# Patient Record
Sex: Male | Born: 1940
Health system: Southern US, Community
[De-identification: ages and names within clinical notes are randomized; demographics above are authoritative.]

## PROBLEM LIST (undated history)

## (undated) DIAGNOSIS — Z87442 Personal history of urinary calculi: Secondary | ICD-10-CM

## (undated) DIAGNOSIS — C801 Malignant (primary) neoplasm, unspecified: Secondary | ICD-10-CM

## (undated) DIAGNOSIS — C4491 Basal cell carcinoma of skin, unspecified: Secondary | ICD-10-CM

## (undated) DIAGNOSIS — M1990 Unspecified osteoarthritis, unspecified site: Secondary | ICD-10-CM

## (undated) DIAGNOSIS — N183 Chronic kidney disease, stage 3 unspecified: Secondary | ICD-10-CM

## (undated) DIAGNOSIS — A6920 Lyme disease, unspecified: Secondary | ICD-10-CM

## (undated) DIAGNOSIS — Z8619 Personal history of other infectious and parasitic diseases: Secondary | ICD-10-CM

## (undated) DIAGNOSIS — E785 Hyperlipidemia, unspecified: Secondary | ICD-10-CM

## (undated) DIAGNOSIS — C8330 Diffuse large B-cell lymphoma, unspecified site: Secondary | ICD-10-CM

## (undated) DIAGNOSIS — C189 Malignant neoplasm of colon, unspecified: Secondary | ICD-10-CM

## (undated) DIAGNOSIS — E119 Type 2 diabetes mellitus without complications: Secondary | ICD-10-CM

## (undated) DIAGNOSIS — I10 Essential (primary) hypertension: Secondary | ICD-10-CM

## (undated) DIAGNOSIS — C61 Malignant neoplasm of prostate: Secondary | ICD-10-CM

## (undated) HISTORY — PX: TONSILLECTOMY: SUR1361

## (undated) HISTORY — PX: COLONOSCOPY: SHX174

## (undated) HISTORY — PX: FRACTURE SURGERY: SHX138

## (undated) HISTORY — DX: Diffuse large B-cell lymphoma, unspecified site: C83.30

## (undated) HISTORY — PX: EYE SURGERY: SHX253

## (undated) HISTORY — PX: LITHOTRIPSY: SUR834

---

## 1996-02-22 DIAGNOSIS — C189 Malignant neoplasm of colon, unspecified: Secondary | ICD-10-CM

## 1996-02-22 HISTORY — PX: PROSTATECTOMY: SHX69

## 1996-02-22 HISTORY — PX: COLON SURGERY: SHX602

## 1996-02-22 HISTORY — DX: Malignant neoplasm of colon, unspecified: C18.9

## 1997-02-21 DIAGNOSIS — C61 Malignant neoplasm of prostate: Secondary | ICD-10-CM

## 1997-02-21 HISTORY — DX: Malignant neoplasm of prostate: C61

## 2006-02-21 DIAGNOSIS — C4491 Basal cell carcinoma of skin, unspecified: Secondary | ICD-10-CM

## 2006-02-21 HISTORY — DX: Basal cell carcinoma of skin, unspecified: C44.91

## 2014-09-04 DIAGNOSIS — I10 Essential (primary) hypertension: Secondary | ICD-10-CM | POA: Diagnosis not present

## 2014-09-04 DIAGNOSIS — C61 Malignant neoplasm of prostate: Secondary | ICD-10-CM | POA: Diagnosis not present

## 2014-09-04 DIAGNOSIS — N183 Chronic kidney disease, stage 3 unspecified: Secondary | ICD-10-CM | POA: Insufficient documentation

## 2014-09-04 DIAGNOSIS — E1129 Type 2 diabetes mellitus with other diabetic kidney complication: Secondary | ICD-10-CM | POA: Insufficient documentation

## 2014-09-04 DIAGNOSIS — E785 Hyperlipidemia, unspecified: Secondary | ICD-10-CM | POA: Insufficient documentation

## 2014-09-04 DIAGNOSIS — E782 Mixed hyperlipidemia: Secondary | ICD-10-CM | POA: Diagnosis not present

## 2014-09-04 DIAGNOSIS — Z85038 Personal history of other malignant neoplasm of large intestine: Secondary | ICD-10-CM | POA: Insufficient documentation

## 2014-09-04 DIAGNOSIS — E1122 Type 2 diabetes mellitus with diabetic chronic kidney disease: Secondary | ICD-10-CM | POA: Diagnosis not present

## 2014-09-16 DIAGNOSIS — Z79899 Other long term (current) drug therapy: Secondary | ICD-10-CM | POA: Diagnosis not present

## 2014-09-16 DIAGNOSIS — C61 Malignant neoplasm of prostate: Secondary | ICD-10-CM | POA: Diagnosis not present

## 2014-09-16 DIAGNOSIS — E782 Mixed hyperlipidemia: Secondary | ICD-10-CM | POA: Diagnosis not present

## 2014-09-16 DIAGNOSIS — E1122 Type 2 diabetes mellitus with diabetic chronic kidney disease: Secondary | ICD-10-CM | POA: Diagnosis not present

## 2014-09-16 DIAGNOSIS — N183 Chronic kidney disease, stage 3 (moderate): Secondary | ICD-10-CM | POA: Diagnosis not present

## 2014-11-03 DIAGNOSIS — Z85038 Personal history of other malignant neoplasm of large intestine: Secondary | ICD-10-CM | POA: Diagnosis not present

## 2014-12-08 DIAGNOSIS — L570 Actinic keratosis: Secondary | ICD-10-CM | POA: Diagnosis not present

## 2014-12-08 DIAGNOSIS — Z08 Encounter for follow-up examination after completed treatment for malignant neoplasm: Secondary | ICD-10-CM | POA: Diagnosis not present

## 2014-12-08 DIAGNOSIS — Z1283 Encounter for screening for malignant neoplasm of skin: Secondary | ICD-10-CM | POA: Diagnosis not present

## 2014-12-08 DIAGNOSIS — D485 Neoplasm of uncertain behavior of skin: Secondary | ICD-10-CM | POA: Diagnosis not present

## 2014-12-08 DIAGNOSIS — Z85828 Personal history of other malignant neoplasm of skin: Secondary | ICD-10-CM | POA: Diagnosis not present

## 2014-12-10 DIAGNOSIS — Z23 Encounter for immunization: Secondary | ICD-10-CM | POA: Diagnosis not present

## 2014-12-19 DIAGNOSIS — D225 Melanocytic nevi of trunk: Secondary | ICD-10-CM | POA: Diagnosis not present

## 2014-12-19 DIAGNOSIS — D485 Neoplasm of uncertain behavior of skin: Secondary | ICD-10-CM | POA: Diagnosis not present

## 2014-12-19 DIAGNOSIS — D2262 Melanocytic nevi of left upper limb, including shoulder: Secondary | ICD-10-CM | POA: Diagnosis not present

## 2014-12-29 ENCOUNTER — Ambulatory Visit
Admission: RE | Admit: 2014-12-29 | Discharge: 2014-12-29 | Disposition: A | Discharge status: Home / Self Care | Payer: Commercial Managed Care - HMO | Source: Ambulatory Visit | Attending: Gastroenterology | Admitting: Gastroenterology

## 2014-12-29 ENCOUNTER — Encounter
Admission: RE | Disposition: A | Discharge status: Home / Self Care | Payer: Self-pay | Source: Ambulatory Visit | Attending: Gastroenterology

## 2014-12-29 ENCOUNTER — Ambulatory Visit: Payer: Commercial Managed Care - HMO | Admitting: Certified Registered Nurse Anesthetist

## 2014-12-29 DIAGNOSIS — K5730 Diverticulosis of large intestine without perforation or abscess without bleeding: Secondary | ICD-10-CM | POA: Diagnosis not present

## 2014-12-29 DIAGNOSIS — K760 Fatty (change of) liver, not elsewhere classified: Secondary | ICD-10-CM | POA: Diagnosis not present

## 2014-12-29 DIAGNOSIS — Z980 Intestinal bypass and anastomosis status: Secondary | ICD-10-CM | POA: Diagnosis not present

## 2014-12-29 DIAGNOSIS — N183 Chronic kidney disease, stage 3 (moderate): Secondary | ICD-10-CM | POA: Diagnosis not present

## 2014-12-29 DIAGNOSIS — I129 Hypertensive chronic kidney disease with stage 1 through stage 4 chronic kidney disease, or unspecified chronic kidney disease: Secondary | ICD-10-CM | POA: Insufficient documentation

## 2014-12-29 DIAGNOSIS — Z9221 Personal history of antineoplastic chemotherapy: Secondary | ICD-10-CM | POA: Insufficient documentation

## 2014-12-29 DIAGNOSIS — K648 Other hemorrhoids: Secondary | ICD-10-CM | POA: Insufficient documentation

## 2014-12-29 DIAGNOSIS — Z8546 Personal history of malignant neoplasm of prostate: Secondary | ICD-10-CM | POA: Insufficient documentation

## 2014-12-29 DIAGNOSIS — Z86010 Personal history of colonic polyps: Secondary | ICD-10-CM | POA: Insufficient documentation

## 2014-12-29 DIAGNOSIS — Z85038 Personal history of other malignant neoplasm of large intestine: Secondary | ICD-10-CM | POA: Insufficient documentation

## 2014-12-29 DIAGNOSIS — D125 Benign neoplasm of sigmoid colon: Secondary | ICD-10-CM | POA: Diagnosis not present

## 2014-12-29 DIAGNOSIS — Z85828 Personal history of other malignant neoplasm of skin: Secondary | ICD-10-CM | POA: Insufficient documentation

## 2014-12-29 DIAGNOSIS — Z7982 Long term (current) use of aspirin: Secondary | ICD-10-CM | POA: Insufficient documentation

## 2014-12-29 DIAGNOSIS — E785 Hyperlipidemia, unspecified: Secondary | ICD-10-CM | POA: Diagnosis not present

## 2014-12-29 DIAGNOSIS — Z8669 Personal history of other diseases of the nervous system and sense organs: Secondary | ICD-10-CM | POA: Insufficient documentation

## 2014-12-29 DIAGNOSIS — K635 Polyp of colon: Secondary | ICD-10-CM | POA: Diagnosis not present

## 2014-12-29 DIAGNOSIS — Z79899 Other long term (current) drug therapy: Secondary | ICD-10-CM | POA: Insufficient documentation

## 2014-12-29 DIAGNOSIS — K640 First degree hemorrhoids: Secondary | ICD-10-CM | POA: Diagnosis not present

## 2014-12-29 DIAGNOSIS — D123 Benign neoplasm of transverse colon: Secondary | ICD-10-CM | POA: Diagnosis not present

## 2014-12-29 DIAGNOSIS — I10 Essential (primary) hypertension: Secondary | ICD-10-CM | POA: Diagnosis not present

## 2014-12-29 DIAGNOSIS — K5790 Diverticulosis of intestine, part unspecified, without perforation or abscess without bleeding: Secondary | ICD-10-CM | POA: Diagnosis not present

## 2014-12-29 HISTORY — DX: Chronic kidney disease, stage 3 unspecified: N18.30

## 2014-12-29 HISTORY — DX: Chronic kidney disease, stage 3 (moderate): N18.3

## 2014-12-29 HISTORY — DX: Essential (primary) hypertension: I10

## 2014-12-29 HISTORY — DX: Malignant neoplasm of colon, unspecified: C18.9

## 2014-12-29 HISTORY — DX: Basal cell carcinoma of skin, unspecified: C44.91

## 2014-12-29 HISTORY — DX: Malignant neoplasm of prostate: C61

## 2014-12-29 HISTORY — DX: Type 2 diabetes mellitus without complications: E11.9

## 2014-12-29 HISTORY — DX: Malignant (primary) neoplasm, unspecified: C80.1

## 2014-12-29 HISTORY — PX: COLONOSCOPY WITH PROPOFOL: SHX5780

## 2014-12-29 HISTORY — DX: Lyme disease, unspecified: A69.20

## 2014-12-29 HISTORY — DX: Hyperlipidemia, unspecified: E78.5

## 2014-12-29 SURGERY — COLONOSCOPY WITH PROPOFOL
Anesthesia: General

## 2014-12-29 MED ORDER — SODIUM CHLORIDE 0.9 % IV SOLN
INTRAVENOUS | Status: DC
  Administered 2014-12-29: 1000 mL via INTRAVENOUS

## 2014-12-29 MED ORDER — PROPOFOL 500 MG/50ML IV EMUL
INTRAVENOUS | Status: DC | PRN
  Administered 2014-12-29: 140 ug/kg/min via INTRAVENOUS

## 2014-12-29 MED ORDER — MIDAZOLAM HCL 2 MG/2ML IJ SOLN
INTRAMUSCULAR | Status: DC | PRN
  Administered 2014-12-29: 2 mg via INTRAVENOUS

## 2014-12-29 MED ORDER — PHENYLEPHRINE HCL 10 MG/ML IJ SOLN
INTRAMUSCULAR | Status: DC | PRN
  Administered 2014-12-29: 50 ug via INTRAVENOUS

## 2014-12-29 MED ORDER — FENTANYL CITRATE (PF) 100 MCG/2ML IJ SOLN
INTRAMUSCULAR | Status: DC | PRN
  Administered 2014-12-29: 50 ug via INTRAVENOUS
  Administered 2014-12-29 (×2): 25 ug via INTRAVENOUS

## 2014-12-29 MED ORDER — PROPOFOL 10 MG/ML IV BOLUS
INTRAVENOUS | Status: DC | PRN
  Administered 2014-12-29: 20 mg via INTRAVENOUS
  Administered 2014-12-29: 10 mg via INTRAVENOUS

## 2014-12-29 MED ORDER — SODIUM CHLORIDE 0.9 % IV SOLN: INTRAVENOUS | Status: DC

## 2014-12-29 NOTE — Anesthesia Procedure Notes (Signed)
Procedure Name: MAC Date/Time: 12/29/2014 8:11 AM Performed by: Johnna Acosta Pre-anesthesia Checklist: Patient identified, Emergency Drugs available, Suction available, Patient being monitored and Timeout performed Patient Re-evaluated:Patient Re-evaluated prior to inductionOxygen Delivery Method: Nasal cannula

## 2014-12-29 NOTE — H&P (Signed)
Outpatient short stay form Pre-procedure 12/29/2014 8:07 AM Christian Sails MD  Primary Physician: Dr. Genene Churn  Reason for visit:  Colonoscopy  History of present illness:  Asian is a 74 year old male presenting today for colon cancer surveillance procedure. Personal history of colon cancer with possible sigmoid colectomy in the late 1990s. Also had chemotherapy the last time in 1999. His had colonoscopies since then he has had some colon polyps however not on the last procedure. He has not taken any aspirin products for about a week. He takes no blood thinners.    Current facility-administered medications:  .  0.9 %  sodium chloride infusion, , Intravenous, Continuous, Christian Sails, MD, Last Rate: 20 mL/hr at 12/29/14 0751, 1,000 mL at 12/29/14 0751 .  0.9 %  sodium chloride infusion, , Intravenous, Continuous, Christian Sails, MD  Prescriptions prior to admission  Medication Sig Dispense Refill Last Dose  . aspirin EC 81 MG tablet Take 81 mg by mouth daily.     Marland Kitchen atorvastatin (LIPITOR) 40 MG tablet Take 40 mg by mouth daily.     . hydrochlorothiazide (HYDRODIURIL) 25 MG tablet Take 25 mg by mouth daily.     Marland Kitchen lisinopril (PRINIVIL,ZESTRIL) 10 MG tablet Take 10 mg by mouth daily.     . Multiple Vitamin (MULTIVITAMIN) capsule Take 1 capsule by mouth daily.     . Probiotic Product (PROBIOTIC PO) Take 1 capsule by mouth.        No Known Allergies   Past Medical History  Diagnosis Date  . Cancer ([place])   . Basal cell carcinoma   . Chronic renal insufficiency, stage III (moderate)   . Hypertension   . Colon cancer ([place])   . Lyme disease   . Hyperlipemia   . Prostate cancer ([place])     Review of systems:      Physical Exam    Heart and lungs: Regular rate and rhythm without rub or gallop, lungs are bilaterally clear    HEENT: Normocephalic atraumatic eyes are anicteric    Other:     Pertinant exam for procedure: Soft nontender nondistended bowel sounds positive  normoactive    Planned proceedures: Colonoscopy and indicated procedures I have discussed the risks benefits and complications of procedures to include not limited to bleeding, infection, perforation and the risk of sedation and the patient wishes to proceed.    Christian Sails, MD Gastroenterology 12/29/2014  8:07 AM

## 2014-12-29 NOTE — Op Note (Signed)
[facility] Gastroenterology Patient Name: Christian Kelly Procedure Date: 12/29/2014 8:11 AM MRN: [id number] Account #: [id number] Date of Birth: Jul 05, 1940 Admit Type: Outpatient Age: 74 Room: [facility] ENDO ROOM 3 Gender: Male Note Status: Finalized Procedure:         Colonoscopy Indications:       Personal history of malignant neoplasm of the colon,                     Personal history of colonic polyps Providers:         Lollie Sails, MD Referring MD:      Christena Flake. Raechel Ache, MD (Referring MD) Medicines:         Monitored Anesthesia Care Complications:     No immediate complications. Procedure:         Pre-Anesthesia Assessment:                    - ASA Grade Assessment: III - A patient with severe                     systemic disease.                    After obtaining informed consent, the colonoscope was                     passed under direct vision. Throughout the procedure, the                     patient's blood pressure, pulse, and oxygen saturations                     were monitored continuously. The Colonoscope was                     introduced through the anus and advanced to the the cecum,                     identified by appendiceal orifice and ileocecal valve. The                     colonoscopy was performed without difficulty. The patient                     tolerated the procedure well. The quality of the bowel                     preparation was good. Findings:      Two sessile polyps were found in the distal sigmoid colon. The polyps       were 2 to 3 mm in size. These polyps were removed with a cold biopsy       forceps. Resection and retrieval were complete.      A 4 mm polyp was found in the distal sigmoid colon. The polyp was       pedunculated. The polyp was removed with a cold snare. Resection and       retrieval were complete.      Multiple small-mouthed diverticula were found in the proximal sigmoid       colon and in the descending  colon.      There was evidence of a prior end-to-end colo-colonic anastomosis at 17       cm proximal to the anus. This was patent. This was characterized by  healthy appearing mucosa.      Three sessile polyps were found in the transverse colon. The polyps were       1 to 2 mm in size. These polyps were removed with a cold biopsy forceps.       Resection and retrieval were complete.      Non-bleeding internal hemorrhoids were found during anoscopy. The       hemorrhoids were small.      No additional abnormalities were found on retroflexion.      The digital rectal exam was normal. Impression:        - Two 2 to 3 mm polyps in the distal sigmoid colon.                     Resected and retrieved.                    - One 4 mm polyp in the distal sigmoid colon. Resected and                     retrieved.                    - Diverticulosis in the proximal sigmoid colon and in the                     descending colon.                    - Patent end-to-end colo-colonic anastomosis,                     characterized by healthy appearing mucosa.                    - Three 1 to 2 mm polyps in the transverse colon. Resected                     and retrieved.                    - Non-bleeding internal hemorrhoids. Recommendation:    - Await pathology results.                    - Telephone GI clinic for pathology results in 1 week. Procedure Code(s): --- Professional ---                    [id number], Colonoscopy, flexible; with removal of tumor(s),                     polyp(s), or other lesion(s) by snare technique                    45380, 59, Colonoscopy, flexible; with biopsy, single or                     multiple Diagnosis Code(s): --- Professional ---                    211.3, Benign neoplasm of colon                    455.0, Internal hemorrhoids without mention of complication                    V10.05, Personal history of malignant neoplasm of large  intestine                     V12.72, Personal history of colonic polyps                    562.10, Diverticulosis of colon (without mention of                     hemorrhage)                    V45.3, Intestinal bypass or anastomosis status CPT copyright 2014 American Medical Association. All rights reserved. The codes documented in this report are preliminary and upon coder review may  be revised to meet current compliance requirements. Lollie Sails, MD 12/29/2014 8:47:26 AM This report has been signed electronically. Number of Addenda: 0 Note Initiated On: 12/29/2014 8:11 AM Scope Withdrawal Time: 0 hours 13 minutes 10 seconds  Total Procedure Duration: 0 hours 24 minutes 4 seconds       [facility]

## 2014-12-29 NOTE — Transfer of Care (Signed)
Immediate Anesthesia Transfer of Care Note  Patient: Christian Kelly  Procedure(s) Performed: Procedure(s): COLONOSCOPY WITH PROPOFOL (N/A)  Patient Location: PACU  Anesthesia Type:General  Level of Consciousness: sedated  Airway & Oxygen Therapy: Patient Spontanous Breathing and Patient connected to nasal cannula oxygen  Post-op Assessment: Report given to RN and Post -op Vital signs reviewed and stable  Post vital signs: Reviewed and stable  Last Vitals:  Filed Vitals:   12/29/14 0736  BP: 168/79  Pulse: 56  Temp: 36.3 C  Resp: 19    Complications: No apparent anesthesia complications

## 2014-12-29 NOTE — Anesthesia Preprocedure Evaluation (Signed)
Anesthesia Evaluation  Patient identified by MRN, date of birth, ID band Patient awake    Reviewed: Allergy & Precautions, H&P , NPO status , Patient's Chart, lab work & pertinent test results, reviewed documented beta blocker date and time   History of Anesthesia Complications Negative for: history of anesthetic complications  Airway Mallampati: II  TM Distance: >3 FB Neck ROM: full    Dental  (+) Caps, Teeth Intact   Pulmonary neg shortness of breath, neg sleep apnea, neg COPD, neg recent URI, former smoker,    Pulmonary exam normal breath sounds clear to auscultation       Cardiovascular Exercise Tolerance: Good hypertension, On Medications (-) angina(-) CAD, (-) Past MI, (-) Cardiac Stents and (-) CABG Normal cardiovascular exam(-) dysrhythmias (-) Valvular Problems/Murmurs Rhythm:regular Rate:Normal     Neuro/Psych negative neurological ROS  negative psych ROS   GI/Hepatic negative GI ROS, Fatty liver disease   Endo/Other  diabetes (borderline)  Renal/GU CRFRenal disease  negative genitourinary   Musculoskeletal   Abdominal   Peds  Hematology negative hematology ROS (+)   Anesthesia Other Findings Past Medical History:   Cancer ([place])                                                 Basal cell carcinoma                                         Chronic renal insufficiency, stage III (modera*              Hypertension                                                 Colon cancer ([facility])                                           Lyme disease                                                 Hyperlipemia                                                 Prostate cancer (HCC)                                        Reproductive/Obstetrics negative OB ROS                             Anesthesia Physical Anesthesia Plan  ASA: III  Anesthesia Plan: General   Post-op Pain Management:     Induction:   Airway Management Planned:  Additional Equipment:   Intra-op Plan:   Post-operative Plan:   Informed Consent: I have reviewed the patients History and Physical, chart, labs and discussed the procedure including the risks, benefits and alternatives for the proposed anesthesia with the patient or authorized representative who has indicated his/her understanding and acceptance.   Dental Advisory Given  Plan Discussed with: Anesthesiologist, CRNA and Surgeon  Anesthesia Plan Comments:         Anesthesia Quick Evaluation

## 2014-12-29 NOTE — Anesthesia Postprocedure Evaluation (Signed)
  Anesthesia Post-op Note  Patient: Christian Kelly  Procedure(s) Performed: Procedure(s): COLONOSCOPY WITH PROPOFOL (N/A)  Anesthesia type:General  Patient location: PACU  Post pain: Pain level controlled  Post assessment: Post-op Vital signs reviewed, Patient's Cardiovascular Status Stable, Respiratory Function Stable, Patent Airway and No signs of Nausea or vomiting  Post vital signs: Reviewed and stable  Last Vitals:  Filed Vitals:   12/29/14 0922  BP: 114/72  Pulse: 52  Temp:   Resp: 13    Level of consciousness: awake, alert  and patient cooperative  Complications: No apparent anesthesia complications

## 2014-12-30 ENCOUNTER — Encounter: Payer: Self-pay | Admitting: Gastroenterology

## 2014-12-30 LAB — SURGICAL PATHOLOGY

## 2015-03-04 DIAGNOSIS — H35342 Macular cyst, hole, or pseudohole, left eye: Secondary | ICD-10-CM | POA: Diagnosis not present

## 2015-04-07 DIAGNOSIS — E782 Mixed hyperlipidemia: Secondary | ICD-10-CM | POA: Diagnosis not present

## 2015-04-07 DIAGNOSIS — Z79899 Other long term (current) drug therapy: Secondary | ICD-10-CM | POA: Insufficient documentation

## 2015-04-07 DIAGNOSIS — D126 Benign neoplasm of colon, unspecified: Secondary | ICD-10-CM | POA: Insufficient documentation

## 2015-04-07 DIAGNOSIS — E1122 Type 2 diabetes mellitus with diabetic chronic kidney disease: Secondary | ICD-10-CM | POA: Diagnosis not present

## 2015-04-07 DIAGNOSIS — C61 Malignant neoplasm of prostate: Secondary | ICD-10-CM | POA: Diagnosis not present

## 2015-04-07 DIAGNOSIS — N183 Chronic kidney disease, stage 3 (moderate): Secondary | ICD-10-CM | POA: Diagnosis not present

## 2015-04-07 DIAGNOSIS — Z86010 Personal history of colonic polyps: Secondary | ICD-10-CM | POA: Insufficient documentation

## 2015-07-28 DIAGNOSIS — W57XXXA Bitten or stung by nonvenomous insect and other nonvenomous arthropods, initial encounter: Secondary | ICD-10-CM | POA: Diagnosis not present

## 2015-07-28 DIAGNOSIS — G44229 Chronic tension-type headache, not intractable: Secondary | ICD-10-CM | POA: Diagnosis not present

## 2015-12-03 DIAGNOSIS — Z79899 Other long term (current) drug therapy: Secondary | ICD-10-CM | POA: Diagnosis not present

## 2015-12-03 DIAGNOSIS — I10 Essential (primary) hypertension: Secondary | ICD-10-CM | POA: Diagnosis not present

## 2015-12-03 DIAGNOSIS — E782 Mixed hyperlipidemia: Secondary | ICD-10-CM | POA: Diagnosis not present

## 2015-12-03 DIAGNOSIS — Z23 Encounter for immunization: Secondary | ICD-10-CM | POA: Diagnosis not present

## 2015-12-03 DIAGNOSIS — N182 Chronic kidney disease, stage 2 (mild): Secondary | ICD-10-CM | POA: Diagnosis not present

## 2015-12-03 DIAGNOSIS — E1122 Type 2 diabetes mellitus with diabetic chronic kidney disease: Secondary | ICD-10-CM | POA: Diagnosis not present

## 2015-12-03 DIAGNOSIS — C61 Malignant neoplasm of prostate: Secondary | ICD-10-CM | POA: Diagnosis not present

## 2015-12-30 DIAGNOSIS — Z1283 Encounter for screening for malignant neoplasm of skin: Secondary | ICD-10-CM | POA: Diagnosis not present

## 2015-12-30 DIAGNOSIS — L821 Other seborrheic keratosis: Secondary | ICD-10-CM | POA: Diagnosis not present

## 2015-12-30 DIAGNOSIS — L570 Actinic keratosis: Secondary | ICD-10-CM | POA: Diagnosis not present

## 2015-12-30 DIAGNOSIS — Z872 Personal history of diseases of the skin and subcutaneous tissue: Secondary | ICD-10-CM | POA: Diagnosis not present

## 2015-12-30 DIAGNOSIS — Z09 Encounter for follow-up examination after completed treatment for conditions other than malignant neoplasm: Secondary | ICD-10-CM | POA: Diagnosis not present

## 2015-12-30 DIAGNOSIS — B078 Other viral warts: Secondary | ICD-10-CM | POA: Diagnosis not present

## 2016-04-06 DIAGNOSIS — H35342 Macular cyst, hole, or pseudohole, left eye: Secondary | ICD-10-CM | POA: Diagnosis not present

## 2016-04-06 DIAGNOSIS — H401130 Primary open-angle glaucoma, bilateral, stage unspecified: Secondary | ICD-10-CM | POA: Diagnosis not present

## 2016-06-02 DIAGNOSIS — B351 Tinea unguium: Secondary | ICD-10-CM | POA: Insufficient documentation

## 2016-06-02 DIAGNOSIS — E782 Mixed hyperlipidemia: Secondary | ICD-10-CM | POA: Diagnosis not present

## 2016-06-02 DIAGNOSIS — N182 Chronic kidney disease, stage 2 (mild): Secondary | ICD-10-CM | POA: Diagnosis not present

## 2016-06-02 DIAGNOSIS — Z79899 Other long term (current) drug therapy: Secondary | ICD-10-CM | POA: Diagnosis not present

## 2016-06-02 DIAGNOSIS — C61 Malignant neoplasm of prostate: Secondary | ICD-10-CM | POA: Diagnosis not present

## 2016-06-02 DIAGNOSIS — E041 Nontoxic single thyroid nodule: Secondary | ICD-10-CM | POA: Diagnosis not present

## 2016-06-02 DIAGNOSIS — E1122 Type 2 diabetes mellitus with diabetic chronic kidney disease: Secondary | ICD-10-CM | POA: Diagnosis not present

## 2016-06-02 DIAGNOSIS — I10 Essential (primary) hypertension: Secondary | ICD-10-CM | POA: Diagnosis not present

## 2016-06-02 DIAGNOSIS — N183 Chronic kidney disease, stage 3 (moderate): Secondary | ICD-10-CM | POA: Diagnosis not present

## 2016-06-08 ENCOUNTER — Other Ambulatory Visit: Payer: Self-pay | Admitting: Internal Medicine

## 2016-06-08 DIAGNOSIS — E041 Nontoxic single thyroid nodule: Secondary | ICD-10-CM

## 2016-06-14 ENCOUNTER — Ambulatory Visit
Admission: RE | Admit: 2016-06-14 | Discharge: 2016-06-14 | Disposition: A | Discharge status: Home / Self Care | Payer: Medicare HMO | Source: Ambulatory Visit | Attending: Internal Medicine | Admitting: Internal Medicine

## 2016-06-14 DIAGNOSIS — Z8639 Personal history of other endocrine, nutritional and metabolic disease: Secondary | ICD-10-CM | POA: Diagnosis not present

## 2016-06-14 DIAGNOSIS — E041 Nontoxic single thyroid nodule: Secondary | ICD-10-CM | POA: Insufficient documentation

## 2016-06-20 ENCOUNTER — Other Ambulatory Visit: Payer: Self-pay | Admitting: Internal Medicine

## 2016-06-20 ENCOUNTER — Ambulatory Visit: Payer: Medicare HMO | Admitting: Oncology

## 2016-07-11 ENCOUNTER — Inpatient Hospital Stay: Payer: Medicare HMO | Admitting: Oncology

## 2016-07-11 ENCOUNTER — Inpatient Hospital Stay: Payer: Medicare HMO | Attending: Oncology | Admitting: Oncology

## 2016-07-11 ENCOUNTER — Encounter: Payer: Self-pay | Admitting: Oncology

## 2016-07-11 VITALS — BP 132/79 | HR 83 | Temp 96.3°F | Resp 18 | Ht 72.44 in | Wt 196.4 lb

## 2016-07-11 DIAGNOSIS — R9721 Rising PSA following treatment for malignant neoplasm of prostate: Secondary | ICD-10-CM

## 2016-07-11 DIAGNOSIS — Z79899 Other long term (current) drug therapy: Secondary | ICD-10-CM | POA: Diagnosis not present

## 2016-07-11 DIAGNOSIS — C61 Malignant neoplasm of prostate: Secondary | ICD-10-CM | POA: Diagnosis not present

## 2016-07-11 DIAGNOSIS — Z87891 Personal history of nicotine dependence: Secondary | ICD-10-CM | POA: Diagnosis not present

## 2016-07-11 DIAGNOSIS — E1121 Type 2 diabetes mellitus with diabetic nephropathy: Secondary | ICD-10-CM | POA: Insufficient documentation

## 2016-07-11 DIAGNOSIS — Z7982 Long term (current) use of aspirin: Secondary | ICD-10-CM | POA: Insufficient documentation

## 2016-07-11 DIAGNOSIS — Z85038 Personal history of other malignant neoplasm of large intestine: Secondary | ICD-10-CM | POA: Insufficient documentation

## 2016-07-11 DIAGNOSIS — N529 Male erectile dysfunction, unspecified: Secondary | ICD-10-CM | POA: Diagnosis not present

## 2016-07-11 DIAGNOSIS — Z9079 Acquired absence of other genital organ(s): Secondary | ICD-10-CM | POA: Diagnosis not present

## 2016-07-11 DIAGNOSIS — Z85828 Personal history of other malignant neoplasm of skin: Secondary | ICD-10-CM | POA: Diagnosis not present

## 2016-07-11 DIAGNOSIS — M549 Dorsalgia, unspecified: Secondary | ICD-10-CM | POA: Diagnosis not present

## 2016-07-11 DIAGNOSIS — Z8052 Family history of malignant neoplasm of bladder: Secondary | ICD-10-CM

## 2016-07-11 DIAGNOSIS — I129 Hypertensive chronic kidney disease with stage 1 through stage 4 chronic kidney disease, or unspecified chronic kidney disease: Secondary | ICD-10-CM | POA: Insufficient documentation

## 2016-07-11 DIAGNOSIS — N183 Chronic kidney disease, stage 3 (moderate): Secondary | ICD-10-CM | POA: Diagnosis not present

## 2016-07-11 DIAGNOSIS — E785 Hyperlipidemia, unspecified: Secondary | ICD-10-CM | POA: Insufficient documentation

## 2016-07-11 DIAGNOSIS — Z8042 Family history of malignant neoplasm of prostate: Secondary | ICD-10-CM

## 2016-07-11 DIAGNOSIS — I10 Essential (primary) hypertension: Secondary | ICD-10-CM | POA: Insufficient documentation

## 2016-07-11 DIAGNOSIS — Z9221 Personal history of antineoplastic chemotherapy: Secondary | ICD-10-CM | POA: Insufficient documentation

## 2016-07-11 DIAGNOSIS — E119 Type 2 diabetes mellitus without complications: Secondary | ICD-10-CM

## 2016-07-11 LAB — CBC WITH DIFFERENTIAL/PLATELET
Basophils Absolute: 0.1 10*3/uL (ref 0–0.1)
Basophils Relative: 1 %
Eosinophils Absolute: 0.2 10*3/uL (ref 0–0.7)
Eosinophils Relative: 2 %
HEMATOCRIT: 46.4 % (ref 40.0–52.0)
HEMOGLOBIN: 15.7 g/dL (ref 13.0–18.0)
LYMPHS ABS: 1.2 10*3/uL (ref 1.0–3.6)
LYMPHS PCT: 13 %
MCH: 30.8 pg (ref 26.0–34.0)
MCHC: 33.8 g/dL (ref 32.0–36.0)
MCV: 91.2 fL (ref 80.0–100.0)
Monocytes Absolute: 0.7 10*3/uL (ref 0.2–1.0)
Monocytes Relative: 8 %
NEUTROS ABS: 6.9 10*3/uL — AB (ref 1.4–6.5)
Neutrophils Relative %: 76 %
Platelets: 232 10*3/uL (ref 150–440)
RBC: 5.09 MIL/uL (ref 4.40–5.90)
RDW: 12.7 % (ref 11.5–14.5)
WBC: 9.2 10*3/uL (ref 3.8–10.6)

## 2016-07-11 LAB — PSA: PSA: 0.61 ng/mL (ref 0.00–4.00)

## 2016-07-11 LAB — COMPREHENSIVE METABOLIC PANEL
ALBUMIN: 4 g/dL (ref 3.5–5.0)
ALK PHOS: 86 U/L (ref 38–126)
ALT: 27 U/L (ref 17–63)
AST: 25 U/L (ref 15–41)
Anion gap: 9 (ref 5–15)
BILIRUBIN TOTAL: 0.7 mg/dL (ref 0.3–1.2)
BUN: 29 mg/dL — ABNORMAL HIGH (ref 6–20)
CALCIUM: 9 mg/dL (ref 8.9–10.3)
CO2: 26 mmol/L (ref 22–32)
Chloride: 101 mmol/L (ref 101–111)
Creatinine, Ser: 1.1 mg/dL (ref 0.61–1.24)
GFR calc non Af Amer: >60 mL/min (ref >60)
GLUCOSE: 163 mg/dL — AB (ref 65–99)
Potassium: 3.7 mmol/L (ref 3.5–5.1)
Sodium: 136 mmol/L (ref 135–145)
TOTAL PROTEIN: 7.5 g/dL (ref 6.5–8.1)

## 2016-07-11 NOTE — Progress Notes (Signed)
Here for new pt evaluation.  

## 2016-07-11 NOTE — Progress Notes (Signed)
Please notify pcp and patient of his blood sugars

## 2016-07-11 NOTE — Progress Notes (Signed)
Hematology/Oncology Consult note [facility] [phone] [phone]  Patient Care Team: Ezequiel Kayser, MD as PCP - General (Internal Medicine)   Name of the patient: Christian Kelly  [id number]  [birth year]    Reason for referral- h/o prostate cancer s/p prostactectomy. Now with biochemical recurrence   Referring physician- Dr. Ezequiel Kayser  Date of visit: 07/11/16   History of presenting illness- Patient is a 76 yr old male with a h/o prostate cancer diagnosed in 1999 s/p radical prostatectomy. Prior to that he had colon cancer in 1998 s/o surgery and adjuvant chemotherapy in 1998. With regards to prostate cancer- surgery was done at [facility] in [place] and he was followed at [place] subsequently. Patient think his Gleasons score was 7 at diagnosis. He has not required any radiation therapy or ADT so far. Patient still spends 4-5 months at [place].   Dr. Raechel Ache has been monitoring his PSArecently  and his recent trend of PSA has been as follows: psa was 0.33 in feb 2017 and 0.63 in April 2018. We have 1 psa from 2015 when it was 0.3  He is otheriwse doing well. Reports mild back pain when he bends backwards. Denies other complaints  ECOG PS- 0  Pain scale- 0   Review of systems- Review of Systems  Constitutional: Negative for chills, fever, malaise/fatigue and weight loss.  HENT: Negative for congestion, ear discharge and nosebleeds.   Eyes: Negative for blurred vision.  Respiratory: Negative for cough, hemoptysis, sputum production, shortness of breath and wheezing.   Cardiovascular: Negative for chest pain, palpitations, orthopnea and claudication.  Gastrointestinal: Negative for abdominal pain, blood in stool, constipation, diarrhea, heartburn, melena, nausea and vomiting.  Genitourinary: Negative for dysuria, flank pain, frequency, hematuria and urgency.  Musculoskeletal: Negative for back pain, joint pain and myalgias.    Skin: Negative for rash.  Neurological: Negative for dizziness, tingling, focal weakness, seizures, weakness and headaches.  Endo/Heme/Allergies: Does not bruise/bleed easily.  Psychiatric/Behavioral: Negative for depression and suicidal ideas. The patient does not have insomnia.     No Known Allergies  Patient Active Problem List   Diagnosis Date Noted  . Essential hypertension 09/04/2014  . History of colon cancer 09/04/2014  . Hyperlipidemia, unspecified 09/04/2014  . Prostate cancer ([place]) 09/04/2014  . Type 2 diabetes mellitus with renal manifestations ([place]) 09/04/2014     Past Medical History:  Diagnosis Date  . Basal cell carcinoma 2008   forehead  . Cancer ([place])   . Chronic renal insufficiency, stage III (moderate)   . Colon cancer ([place]) 1998  . Hyperlipemia   . Hypertension   . Lyme disease   . Prostate cancer ([place]) 1999     Past Surgical History:  Procedure Laterality Date  . COLON SURGERY    . COLONOSCOPY    . COLONOSCOPY WITH PROPOFOL N/A 12/29/2014   Procedure: COLONOSCOPY WITH PROPOFOL;  Surgeon: Lollie Sails, MD;  Location: [facility] ENDOSCOPY;  Service: Endoscopy;  Laterality: N/A;  . EYE SURGERY    . FRACTURE SURGERY    . LITHOTRIPSY    . PROSTATECTOMY    . TONSILLECTOMY      Social History   Social History  . Marital status: Married    Spouse name: N/A  . Number of children: N/A  . Years of education: N/A   Occupational History  . Not on file.   Social History Main Topics  . Smoking status: Former Smoker    Years: 8.00  . Smokeless tobacco:  Former Systems developer    Types: Snuff  . Alcohol use Yes     Comment: rarely-one or two beer  . Drug use: No  . Sexual activity: Not Currently   Other Topics Concern  . Not on file   Social History Narrative  . No narrative on file     Family History  Problem Relation Age of Onset  . Coronary artery disease Mother   . Diabetes Mother   . Prostate cancer Father   . Pancreatitis Father   .  Bladder Cancer Sister   . Diabetes Brother   . Diabetes Brother   . Diabetes Brother      Current Outpatient Prescriptions:  .  aspirin EC 81 MG tablet, Take 81 mg by mouth daily., Disp: , Rfl:  .  atorvastatin (LIPITOR) 40 MG tablet, Take 40 mg by mouth daily., Disp: , Rfl:  .  hydrochlorothiazide (HYDRODIURIL) 25 MG tablet, Take 25 mg by mouth daily., Disp: , Rfl:  .  lisinopril (PRINIVIL,ZESTRIL) 10 MG tablet, Take 10 mg by mouth daily., Disp: , Rfl:  .  Multiple Vitamin (MULTIVITAMIN) capsule, Take 1 capsule by mouth daily., Disp: , Rfl:  .  Probiotic Product (PROBIOTIC PO), Take 1 capsule by mouth., Disp: , Rfl:    Physical exam:  Vitals:   07/11/16 1002  BP: 132/79  Pulse: 83  Resp: 18  Temp: (!) 96.3 F (35.7 C)  TempSrc: Tympanic  Weight: 196 lb 6.9 oz (89.1 kg)  Height: 6' 0.44" (1.84 m)   Physical Exam  Constitutional: He is oriented to person, place, and time and well-developed, well-nourished, and in no distress.  HENT:  Head: Normocephalic and atraumatic.  Eyes: EOM are normal. Pupils are equal, round, and reactive to light.  Neck: Normal range of motion.  Cardiovascular: Normal rate, regular rhythm and normal heart sounds.   Pulmonary/Chest: Effort normal and breath sounds normal.  Abdominal: Soft. Bowel sounds are normal.  Neurological: He is alert and oriented to person, place, and time.  Skin: Skin is warm and dry.         US Soft Tissue Head And Neck  Result Date: 06/14/2016 CLINICAL DATA:  76 year old male with a history of thyroid nodule EXAM: THYROID ULTRASOUND TECHNIQUE: Ultrasound examination of the thyroid gland and adjacent soft tissues was performed. COMPARISON:  None. FINDINGS: Parenchymal Echotexture: Normal Isthmus: 0.3 cm Right lobe: 6.0 cm x 2.0 cm x 1.3 cm Left lobe: 4.6 cm x 1.5 cm x 2.2 cm _________________________________________________________ Estimated total number of nodules >/= 1 cm: 0 Number of spongiform nodules >/=  2 cm not  described below (TR1): 0 Number of mixed cystic and solid nodules >/= 1.5 cm not described below (TR2): 0 _________________________________________________________ Nodule # 1: Location: Right; Mid Maximum size: 0.9 cm; Other 2 dimensions: 0.6 cm x 0.7 cm Composition: solid/almost completely solid (2) Echogenicity: hypoechoic (2) Shape: not taller-than-wide (0) Margins: ill-defined (0) Echogenic foci: macrocalcifications (1) ACR TI-RADS total points: 5. ACR TI-RADS risk category: TR4 (4-6 points). ACR TI-RADS recommendations: Nodule does not meet criteria for surveillance or biopsy _________________________________________________________ IMPRESSION: No thyroid nodule meets criteria for biopsy or surveillance, as designated by the newly established ACR TI-RADS criteria. Recommendations follow those established by the new ACR TI-RADS criteria (J Am Coll Radiol 0315;94:585-929). Electronically Signed   By: Corrie Mckusick D.O.   On: 06/14/2016 09:58    Assessment and plan- Patient is a 76 y.o. male with h/o prostate cancer s/p radical prostatectomy in 1999 now with  biochemical recurrence  Discussed natural history of prostate cancer and management options for biochemical recurrence. Given that his PSA is >0.2 post prostatectmy, he does meet criteria for biochemical recurrence. His PSA doubling time is > 3 months and it has been 20 yrs since surgery indicating that his tempo of progression is slow. At this time, I will obtain bone scan to r/o metastatic prostate cancer. Will hold off on CT thorax and abdomen give that PSA <10. I will refer him Dr. Baruch Gouty for consideration of salvage RT to prostatic bed. We will try to obtain prior operative records of his prostate cancer and he will likely be a candidate for ADT in conjunction to RT at this time. I will refer him to Urology for the same to manage his ADT. I will see him back in 1 months time to discuss bone scan results. I will touch base with urology if they would  like to follow him at this time when he is still castrate sensitive and have him see [place] when he becomes castrate resistant. Check cbc, cmp, psa and serum testosterone today  Thank you for this kind referral and the opportunity to participate in the care of this patient   Visit Diagnosis 1. Increasing PSA level after treatment for prostate cancer     Dr. Randa Evens, MD, MPH [facility] Pager- [phone] 07/11/2016  10:56 AM

## 2016-07-12 LAB — TESTOSTERONE: Testosterone: 454 ng/dL (ref 264–916)

## 2016-07-13 ENCOUNTER — Encounter: Payer: Self-pay | Admitting: Urology

## 2016-07-13 ENCOUNTER — Encounter: Payer: Medicare HMO | Attending: Internal Medicine | Admitting: *Deleted

## 2016-07-13 ENCOUNTER — Ambulatory Visit (INDEPENDENT_AMBULATORY_CARE_PROVIDER_SITE_OTHER): Payer: Medicare HMO | Admitting: Urology

## 2016-07-13 ENCOUNTER — Encounter: Payer: Self-pay | Admitting: *Deleted

## 2016-07-13 VITALS — BP 124/78 | HR 70 | Ht 72.0 in | Wt 195.0 lb

## 2016-07-13 VITALS — BP 110/74 | Ht 72.0 in | Wt 194.7 lb

## 2016-07-13 DIAGNOSIS — C61 Malignant neoplasm of prostate: Secondary | ICD-10-CM

## 2016-07-13 DIAGNOSIS — Z6826 Body mass index (BMI) 26.0-26.9, adult: Secondary | ICD-10-CM | POA: Diagnosis not present

## 2016-07-13 DIAGNOSIS — Z713 Dietary counseling and surveillance: Secondary | ICD-10-CM | POA: Insufficient documentation

## 2016-07-13 DIAGNOSIS — N529 Male erectile dysfunction, unspecified: Secondary | ICD-10-CM

## 2016-07-13 DIAGNOSIS — E119 Type 2 diabetes mellitus without complications: Secondary | ICD-10-CM | POA: Diagnosis not present

## 2016-07-13 DIAGNOSIS — R3915 Urgency of urination: Secondary | ICD-10-CM

## 2016-07-13 NOTE — Progress Notes (Signed)
07/13/2016 3:58 PM   Christian Kelly [birth year] [id number]  Referring provider: Ezequiel Kayser, MD [facility] [place], [place] [address]  Chief Complaint  Patient presents with  . Elevated PSA    New Patient    HPI: 76 year old male who presents today for further evaluation of biochemical recurrence of prostate cancer. He is status post open radical proctectomy in 1999 performed at [facility], [place]. His urologist was Dr. Reece Levy. Unfortunately, he says not have any surgical pathology available today for review.  Records have been requested by Dr. Janese Banks and are pending.  He notes that immediately after his prostatectomy, his PSA was detectable summary and the 0.05 range. He remained low and stable for many years.  More recently, he has been followed by his PCPs following his PSA. In 2015, his PSA was 0.3. This was stable last rate 0.33. This year, his PSA has risen to 0.61 on 07/11/16 (repeat confirmatory value).    Left arm injury in the past/ fractured bone.    He denies any weight loss or bone pain. He denies any significant urinary symptoms including urinary frequency, incomplete bladder emptying, dysuria, or gross hematuria. He has very little stress urinary incontinence and wears no pads or diapers. He does report that when his bladder is full, he has fairly significant urgency. He waits generally no longer than 2 hours to avoid to avoid this. No episodes of urge incontinence.  He does have severe baseline erectile dysfunction. He is tried PDE 5 inhibitors without success as well as vacuum erection device and briefly tried injections. He has wife are currently not sexually active but may be interested in pursuing intervention in the future.   PMH: Past Medical History:  Diagnosis Date  . Basal cell carcinoma 2008   forehead  . Cancer ([place])   . Chronic renal insufficiency, stage III (moderate)   . Colon cancer ([place]) 1998  . Diabetes  mellitus without complication ([place])   . Hyperlipemia   . Hypertension   . Lyme disease   . Prostate cancer [facility]) 1999    Surgical History: Past Surgical History:  Procedure Laterality Date  . COLON SURGERY  1998  . COLONOSCOPY    . COLONOSCOPY WITH PROPOFOL N/A 12/29/2014   Procedure: COLONOSCOPY WITH PROPOFOL;  Surgeon: Lollie Sails, MD;  Location: [facility] ENDOSCOPY;  Service: Endoscopy;  Laterality: N/A;  . EYE SURGERY    . FRACTURE SURGERY    . LITHOTRIPSY    . PROSTATECTOMY  1998  . TONSILLECTOMY      Home Medications:  Allergies as of 07/13/2016   No Known Allergies     Medication List       Accurate as of 07/13/16  3:58 PM. Always use your most recent med list.          aspirin EC 81 MG tablet Take 81 mg by mouth every other day.   atorvastatin 40 MG tablet Commonly known as:  LIPITOR Take 40 mg by mouth daily.   hydrochlorothiazide 25 MG tablet Commonly known as:  HYDRODIURIL Take 25 mg by mouth daily.   lisinopril 10 MG tablet Commonly known as:  PRINIVIL,ZESTRIL Take 10 mg by mouth daily.   multivitamin capsule Take 1 capsule by mouth daily.   PROBIOTIC PO Take 1 capsule by mouth.       Allergies: No Known Allergies  Family History: Family History  Problem Relation Age of Onset  . Coronary artery disease Mother   .  Diabetes Mother   . Prostate cancer Father   . Pancreatitis Father   . Bladder Cancer Sister   . Diabetes Brother   . Diabetes Brother   . Prostate cancer Brother   . Diabetes Brother   . Diabetes Maternal Grandmother     Social History:  reports that he has quit smoking. His smoking use included Cigarettes. He has a 8.00 pack-year smoking history. He has quit using smokeless tobacco. His smokeless tobacco use included Snuff. He reports that he drinks alcohol. He reports that he does not use drugs.  ROS: UROLOGY Frequent Urination?: No Hard to postpone urination?: No Burning/pain with urination?: No Get up at night  to urinate?: No Leakage of urine?: No Urine stream starts and stops?: No Trouble starting stream?: No Do you have to strain to urinate?: No Blood in urine?: No Urinary tract infection?: No Sexually transmitted disease?: No Injury to kidneys or bladder?: No Painful intercourse?: No Weak stream?: No Erection problems?: Yes Penile pain?: No  Gastrointestinal Nausea?: No Vomiting?: No Indigestion/heartburn?: No Diarrhea?: No Constipation?: No  Constitutional Fever: No Night sweats?: No Weight loss?: No Fatigue?: No  Skin Skin rash/lesions?: No Itching?: No  Eyes Blurred vision?: No Double vision?: No  Ears/Nose/Throat Sore throat?: No Sinus problems?: No  Hematologic/Lymphatic Swollen glands?: No Easy bruising?: No  Cardiovascular Leg swelling?: No Chest pain?: No  Respiratory Cough?: No Shortness of breath?: No  Endocrine Excessive thirst?: No  Musculoskeletal Back pain?: No Joint pain?: Yes  Neurological Headaches?: No Dizziness?: Yes  Psychologic Depression?: No Anxiety?: No  Physical Exam: BP 124/78   Pulse 70   Ht 6' (1.829 m)   Wt 195 lb (88.5 kg)   BMI 26.45 kg/m   Constitutional:  Alert and oriented, No acute distress.  Accompanied by wife today. HEENT: [place] AT, moist mucus membranes.  Trachea midline, no masses. Cardiovascular: No clubbing, cyanosis, or edema. Respiratory: Normal respiratory effort, no increased work of breathing. GI: Abdomen is soft, nontender, nondistended, no abdominal masses GU: No CVA tenderness.  Skin: No rashes, bruises or suspicious lesions. Neurologic: Grossly intact, no focal deficits, moving all 4 extremities. Psychiatric: Normal mood and affect.  Laboratory Data: Lab Results  Component Value Date   WBC 9.2 07/11/2016   HGB 15.7 07/11/2016   HCT 46.4 07/11/2016   MCV 91.2 07/11/2016   PLT 232 07/11/2016    Lab Results  Component Value Date   CREATININE 1.10 07/11/2016    Lab Results    Component Value Date   PSA 0.61 07/11/2016    Lab Results  Component Value Date   TESTOSTERONE 454 07/11/2016    Urinalysis n/a  Pertinent Imaging: n/a  Assessment & Plan:  Remote history of prostate cancer in 1999) was radical prostatectomy with persistently low PSAs which have been slowly rising with more recent acceleration, PSA doubling time of approximately one year. PSA currently 0.6.  1. Prostate cancer [facility]) Currently meets criteria for biochemical recurrence with PSA doubling time of approximately one year Agree with Dr. Janese Banks, recommend staging with bone scan and would also recommend CT abdomen and pelvis to rule out metastatic disease If above staging is negative which I anticipate will be, I recommended further evaluation with Dr. Baruch Gouty for salvage radiation.  Specifically, we discussed that if there is residual prostate cancer in the bed of the prostate, this can be treated effectively with external beam radiation with chance for a cure. Given his age, the goal would be to avoid morbidity from  metastatic disease in the future. We really couldn't discussion about risk and benefits of salvage radiation which will be further discussed by the radiation oncologist.  Alternatives including continued watchful waiting were also discussed. Role of incision deprivation therapy at this time is questionable. We discussed the risk of ADT and the patient would prefer to avoid this if possible. As such, we'll proceed with radiation and follow his PSA with consideration of ADT in the future as needed.  Casas personally discussed both with Dr. Janese Banks and Dr. Noreene Filbert. Urology will continue to follow this patient rather than medical oncology until he progresses to metastatic cashier resistant prostate cancer.  - CT Abdomen Pelvis W Contrast; Future  2. Urinary urgency Baseline urinary frequency, discussed that if the patient elects to proceed with salvage radiation, his frequency may  worsen. Patient will let us know if his urinary symptoms become worse and would like to try an anticholinergic medication versus beta 3 agonists which could be sent to his pharmacy.  3. Erectile dysfunction, unspecified erectile dysfunction type Discussion today regarding treatment options for refractory erectile dysfunction. Patient may be excision pursuing this in the future.   Return in about 3 months (around 10/13/2016) for recheck prostate cancer.  Hollice Espy, MD  [facility] [address] [place], [place] [address] [phone]  I spent 45 min with this patient of which greater than 50% was spent in counseling and coordination of care with the patient.

## 2016-07-13 NOTE — Progress Notes (Signed)
Diabetes Self-Management Education  Visit Type: First/Initial  Appt. Start Time: 0820 Appt. End Time: 0920  07/13/2016  [name], identified by name and date of birth, is a 76 y.o. male with a diagnosis of Diabetes: Type 2.   ASSESSMENT  Blood pressure 110/74, height 6' (1.829 m), weight 194 lb 11.2 oz (88.3 kg). Body mass index is 26.41 kg/m.      Diabetes Self-Management Education - 07/13/16 0946      Visit Information   Visit Type First/Initial     Initial Visit   Diabetes Type Type 2   Are you currently following a meal plan? Yes   What type of meal plan do you follow? low sugar and low salt   Are you taking your medications as prescribed? Yes   Date Diagnosed 2 years ago     Health Coping   How would you rate your overall health? Fair     Psychosocial Assessment   Patient Belief/Attitude about Diabetes Motivated to manage diabetes   Self-care barriers None   Self-management support Doctor's office;Family   Patient Concerns Nutrition/Meal planning;Medication;Glycemic Control;Monitoring;Healthy Lifestyle   Special Needs None   Preferred Learning Style Auditory   Learning Readiness Change in progress   How often do you need to have someone help you when you read instructions, pamphlets, or other written materials from your doctor or pharmacy? 1 - Never     Pre-Education Assessment   Patient understands the diabetes disease and treatment process. Needs Instruction   Patient understands incorporating nutritional management into lifestyle. Needs Instruction   Patient undertands incorporating physical activity into lifestyle. Needs Instruction   Patient understands using medications safely. Needs Instruction   Patient understands monitoring blood glucose, interpreting and using results Needs Instruction   Patient understands prevention, detection, and treatment of acute complications. Needs Instruction   Patient understands prevention, detection, and treatment  of chronic complications. Needs Instruction   Patient understands how to develop strategies to address psychosocial issues. Needs Instruction   Patient understands how to develop strategies to promote health/change behavior. Needs Instruction     Complications   Last HgB A1C per patient/outside source 6.9 %  06/02/16   How often do you check your blood sugar? 0 times/day (not testing)  Pt declined glucometer today.    Have you had a dilated eye exam in the past 12 months? Yes   Have you had a dental exam in the past 12 months? Yes   Are you checking your feet? Yes   How many days per week are you checking your feet? 6     Dietary Intake   Breakfast eggs, cereal and fruit, waffle, pancake   Lunch sometimes skips - peanut butter and jelly sandwich, fruit, granola bar   Dinner pork chop, spaghetti, chicken, stir fry, cabbage, onions, broccoli   Snack (evening) tortilla chips   Beverage(s) water, coffee, unsweetened tea with stevia, diet soda, fruit juices     Exercise   Exercise Type ADL's     Patient Education   Previous Diabetes Education No   Disease state  Definition of diabetes, type 1 and 2, and the diagnosis of diabetes;Factors that contribute to the development of diabetes   Nutrition management  Role of diet in the treatment of diabetes and the relationship between the three main macronutrients and blood glucose level;Food label reading, portion sizes and measuring food.   Physical activity and exercise  Role of exercise on diabetes management, blood pressure control and cardiac  health.   Monitoring Purpose and frequency of SMBG.;Identified appropriate SMBG and/or A1C goals.   Chronic complications Relationship between chronic complications and blood glucose control   Psychosocial adjustment Identified and addressed patients feelings and concerns about diabetes     Individualized Goals (developed by patient)   Reducing Risk Improve blood sugars Decrease medications Prevent  diabetes complications Lead a healthier lifestyle     Outcomes   Expected Outcomes Demonstrated interest in learning. Expect positive outcomes   Future DMSE Other (comment)  Pt to check his calendar and call back to schedule classes.       Individualized Plan for Diabetes Self-Management Training:   Learning Objective:  Patient will have a greater understanding of diabetes self-management. Patient education plan is to attend individual and/or group sessions per assessed needs and concerns.   Plan:   Patient Instructions  Exercise: Begin walking   for  15 minutes 3 days a week and gradually increase to 30 minutes 5 x week Eat 3 meals day,  1-2 snacks a day Space meals 4-6 hours apart Don't skip meals Avoid sugar sweetened drinks (juices)   Expected Outcomes:  Demonstrated interest in learning. Expect positive outcomes  Education material provided:  General Meal Planning Guidelines Simple Meal Plan  If problems or questions, patient to contact team via:  Johny Drilling, RN, CCM, CDE [phone]  Future DSME appointment: Pt to check his calendar and call back to schedule classes.

## 2016-07-13 NOTE — Patient Instructions (Signed)
Exercise: Begin walking   for  15 minutes 3 days a week and gradually increase to 30 minutes 5 x week  Eat 3 meals day,  1-2 snacks a day Space meals 4-6 hours apart Don't skip meals Avoid sugar sweetened drinks (juices)  Return for classes on:

## 2016-07-14 ENCOUNTER — Other Ambulatory Visit: Payer: Self-pay

## 2016-07-14 ENCOUNTER — Encounter: Payer: Self-pay | Admitting: Radiation Oncology

## 2016-07-14 ENCOUNTER — Ambulatory Visit
Admission: RE | Admit: 2016-07-14 | Discharge: 2016-07-14 | Disposition: A | Discharge status: Home / Self Care | Payer: Medicare HMO | Source: Ambulatory Visit | Attending: Radiation Oncology | Admitting: Radiation Oncology

## 2016-07-14 VITALS — BP 120/73 | HR 65 | Temp 96.7°F | Resp 18 | Ht 72.0 in | Wt 194.8 lb

## 2016-07-14 DIAGNOSIS — I129 Hypertensive chronic kidney disease with stage 1 through stage 4 chronic kidney disease, or unspecified chronic kidney disease: Secondary | ICD-10-CM | POA: Diagnosis not present

## 2016-07-14 DIAGNOSIS — Z8052 Family history of malignant neoplasm of bladder: Secondary | ICD-10-CM | POA: Insufficient documentation

## 2016-07-14 DIAGNOSIS — Z79899 Other long term (current) drug therapy: Secondary | ICD-10-CM | POA: Insufficient documentation

## 2016-07-14 DIAGNOSIS — C61 Malignant neoplasm of prostate: Secondary | ICD-10-CM | POA: Insufficient documentation

## 2016-07-14 DIAGNOSIS — Z7982 Long term (current) use of aspirin: Secondary | ICD-10-CM | POA: Diagnosis not present

## 2016-07-14 DIAGNOSIS — Z85828 Personal history of other malignant neoplasm of skin: Secondary | ICD-10-CM | POA: Diagnosis not present

## 2016-07-14 DIAGNOSIS — N183 Chronic kidney disease, stage 3 (moderate): Secondary | ICD-10-CM | POA: Insufficient documentation

## 2016-07-14 DIAGNOSIS — E785 Hyperlipidemia, unspecified: Secondary | ICD-10-CM | POA: Diagnosis not present

## 2016-07-14 DIAGNOSIS — Z87891 Personal history of nicotine dependence: Secondary | ICD-10-CM | POA: Insufficient documentation

## 2016-07-14 DIAGNOSIS — Z8042 Family history of malignant neoplasm of prostate: Secondary | ICD-10-CM | POA: Diagnosis not present

## 2016-07-14 DIAGNOSIS — N529 Male erectile dysfunction, unspecified: Secondary | ICD-10-CM | POA: Diagnosis not present

## 2016-07-14 DIAGNOSIS — Z85038 Personal history of other malignant neoplasm of large intestine: Secondary | ICD-10-CM | POA: Diagnosis not present

## 2016-07-14 DIAGNOSIS — E119 Type 2 diabetes mellitus without complications: Secondary | ICD-10-CM | POA: Insufficient documentation

## 2016-07-14 DIAGNOSIS — Z9079 Acquired absence of other genital organ(s): Secondary | ICD-10-CM | POA: Diagnosis not present

## 2016-07-14 DIAGNOSIS — R9721 Rising PSA following treatment for malignant neoplasm of prostate: Secondary | ICD-10-CM | POA: Diagnosis not present

## 2016-07-14 NOTE — Consult Note (Signed)
NEW PATIENT EVALUATION  Name: Christian Kelly  MRN: [id number]  Date:   07/14/2016     DOB: [birth year]   This 76 y.o. male patient presents to the clinic for initial evaluation of prostate cancer in patient with slowly rising PSA status post open radical prostatectomy 1999.  REFERRING PHYSICIAN: Ezequiel Kayser, MD  CHIEF COMPLAINT:  Chief Complaint  Patient presents with  . Prostate Cancer    Pt is here for initial consultation of prostate cancer.      DIAGNOSIS: The encounter diagnosis was Malignant neoplasm of prostate ([place]).   PREVIOUS INVESTIGATIONS:  Clinical notes reviewed Bone scan and CT scans abdomen and pelvis ordered Pathology reports requested  HPI: Patient is a 76 year old male who underwent open radical prostatectomy back in 1999 at the [facility]. We do not have pathology reports available although patient describes that his PSA after prostatectomy was in the 0.05 range. It was stable for many years although started rising 2015 and has doubled over the past year from 0.33 2.61. He specifically denies any bone pain. He does not have dysuria urgency or frequency or incontinence. He does have baseline erectile dysfunction. Bone scan and CT scan of abdomen and pelvis have been ordered. He is referred to radiation oncology for consideration of salvage radiation therapy.  PLANNED TREATMENT REGIMEN: Possible salvage radiation therapy to prostatic fossa  PAST MEDICAL HISTORY:  has a past medical history of Basal cell carcinoma (2008); Cancer ([place]); Chronic renal insufficiency, stage III (moderate); Colon cancer ([place]) (1998); Diabetes mellitus without complication ([place]); Hyperlipemia; Hypertension; Lyme disease; and Prostate cancer ([place]) (1999).    PAST SURGICAL HISTORY:  Past Surgical History:  Procedure Laterality Date  . COLON SURGERY  1998  . COLONOSCOPY    . COLONOSCOPY WITH PROPOFOL N/A 12/29/2014   Procedure: COLONOSCOPY WITH PROPOFOL;  Surgeon: Lollie Sails, MD;  Location: [facility] ENDOSCOPY;  Service: Endoscopy;  Laterality: N/A;  . EYE SURGERY    . FRACTURE SURGERY    . LITHOTRIPSY    . PROSTATECTOMY  1998  . TONSILLECTOMY      FAMILY HISTORY: family history includes Bladder Cancer in his sister; Coronary artery disease in his mother; Diabetes in his brother, brother, brother, maternal grandmother, and mother; Pancreatitis in his father; Prostate cancer in his brother and father.  SOCIAL HISTORY:  reports that he has quit smoking. His smoking use included Cigarettes. He has a 8.00 pack-year smoking history. He has quit using smokeless tobacco. His smokeless tobacco use included Snuff. He reports that he drinks alcohol. He reports that he does not use drugs.  ALLERGIES: Patient has no known allergies.  MEDICATIONS:  Current Outpatient Prescriptions  Medication Sig Dispense Refill  . aspirin EC 81 MG tablet Take 81 mg by mouth every other day.     Marland Kitchen atorvastatin (LIPITOR) 40 MG tablet Take 40 mg by mouth daily.    . hydrochlorothiazide (HYDRODIURIL) 25 MG tablet Take 25 mg by mouth daily.    Marland Kitchen lisinopril (PRINIVIL,ZESTRIL) 10 MG tablet Take 10 mg by mouth daily.    . Multiple Vitamin (MULTIVITAMIN) capsule Take 1 capsule by mouth daily.    . Probiotic Product (PROBIOTIC PO) Take 1 capsule by mouth.     No current facility-administered medications for this encounter.     ECOG PERFORMANCE STATUS:  0 - Asymptomatic  REVIEW OF SYSTEMS:  Patient denies any weight loss, fatigue, weakness, fever, chills or night sweats. Patient denies any loss of vision, blurred vision. Patient denies  any ringing  of the ears or hearing loss. No irregular heartbeat. Patient denies heart murmur or history of fainting. Patient denies any chest pain or pain radiating to her upper extremities. Patient denies any shortness of breath, difficulty breathing at night, cough or hemoptysis. Patient denies any swelling in the lower legs. Patient denies any nausea  vomiting, vomiting of blood, or coffee ground material in the vomitus. Patient denies any stomach pain. Patient states has had normal bowel movements no significant constipation or diarrhea. Patient denies any dysuria, hematuria or significant nocturia. Patient denies any problems walking, swelling in the joints or loss of balance. Patient denies any skin changes, loss of hair or loss of weight. Patient denies any excessive worrying or anxiety or significant depression. Patient denies any problems with insomnia. Patient denies excessive thirst, polyuria, polydipsia. Patient denies any swollen glands, patient denies easy bruising or easy bleeding. Patient denies any recent infections, allergies or URI. Patient "s visual fields have not changed significantly in recent time.    PHYSICAL EXAM: BP 120/73   Pulse 65   Temp (!) 96.7 F (35.9 C)   Resp 18   Ht 6' (1.829 m)   Wt 194 lb 12.4 oz (88.3 kg)   BMI 26.42 kg/m  On rectal exam rectal sphincter tone is good prostatic fossa is clear without evidence of nodularity or mass. Well-developed well-nourished patient in NAD. HEENT reveals PERLA, EOMI, discs not visualized.  Oral cavity is clear. No oral mucosal lesions are identified. Neck is clear without evidence of cervical or supraclavicular adenopathy. Lungs are clear to A&P. Cardiac examination is essentially unremarkable with regular rate and rhythm without murmur rub or thrill. Abdomen is benign with no organomegaly or masses noted. Motor sensory and DTR levels are equal and symmetric in the upper and lower extremities. Cranial nerves II through XII are grossly intact. Proprioception is intact. No peripheral adenopathy or edema is identified. No motor or sensory levels are noted. Crude visual fields are within normal range.  LABORATORY DATA: Pathology reports are requested although from 20 years prior these may be difficult to obtain    RADIOLOGY RESULTS: Bone scan and CT scans have been ordered and  will be reviewed   IMPRESSION: Probable local prostatic fossa recurrence of adenocarcinoma the prostate in 76 year old male status post radical prostatectomy 20 years prior now with slowly rising PSA  PLAN: At this time like to review for complete staging his bone scan and CT scans of abdomen and pelvis. She does B- I would offer palliative radiation therapy to his prostatic fossa. Would treat up to 7600 cGy using IM RT treatment planning and delivery. Risks and benefits of treatment including increased lower urinary tract symptoms possible diarrhea fatigue alteration of blood counts skin reaction all were described in detail to the patient. He seems to comprehend my treatment plan well. I will review all his scans prior to beginning treatment. I do not believe with the slowly rising PSA over the past 20 years we need to treat his pelvic lymph nodes.  I would like to take this opportunity to thank you for allowing me to participate in the care of your patient.Armstead Peaks., MD

## 2016-07-19 ENCOUNTER — Encounter
Admission: RE | Admit: 2016-07-19 | Discharge: 2016-07-19 | Disposition: A | Discharge status: Home / Self Care | Payer: Medicare HMO | Source: Ambulatory Visit | Attending: Oncology | Admitting: Oncology

## 2016-07-19 DIAGNOSIS — R9721 Rising PSA following treatment for malignant neoplasm of prostate: Secondary | ICD-10-CM | POA: Diagnosis not present

## 2016-07-19 DIAGNOSIS — R972 Elevated prostate specific antigen [PSA]: Secondary | ICD-10-CM | POA: Diagnosis not present

## 2016-07-19 MED ORDER — TECHNETIUM TC 99M MEDRONATE IV KIT
25.0000 | PACK | Freq: Once | INTRAVENOUS | Status: AC | PRN
  Administered 2016-07-19: 22.69 via INTRAVENOUS

## 2016-07-20 ENCOUNTER — Other Ambulatory Visit: Payer: Self-pay | Admitting: Urology

## 2016-07-20 ENCOUNTER — Ambulatory Visit
Admission: RE | Admit: 2016-07-20 | Discharge: 2016-07-20 | Disposition: A | Discharge status: Home / Self Care | Payer: Medicare HMO | Source: Ambulatory Visit | Attending: Urology | Admitting: Urology

## 2016-07-20 DIAGNOSIS — C61 Malignant neoplasm of prostate: Secondary | ICD-10-CM | POA: Diagnosis not present

## 2016-07-20 DIAGNOSIS — Z85038 Personal history of other malignant neoplasm of large intestine: Secondary | ICD-10-CM | POA: Diagnosis not present

## 2016-07-20 DIAGNOSIS — I700 Atherosclerosis of aorta: Secondary | ICD-10-CM | POA: Insufficient documentation

## 2016-07-20 DIAGNOSIS — I2510 Atherosclerotic heart disease of native coronary artery without angina pectoris: Secondary | ICD-10-CM | POA: Diagnosis not present

## 2016-07-20 DIAGNOSIS — Z9079 Acquired absence of other genital organ(s): Secondary | ICD-10-CM | POA: Insufficient documentation

## 2016-07-20 DIAGNOSIS — R590 Localized enlarged lymph nodes: Secondary | ICD-10-CM | POA: Diagnosis not present

## 2016-07-20 DIAGNOSIS — R937 Abnormal findings on diagnostic imaging of other parts of musculoskeletal system: Secondary | ICD-10-CM | POA: Diagnosis not present

## 2016-07-20 MED ORDER — IOPAMIDOL (ISOVUE-300) INJECTION 61%
100.0000 mL | Freq: Once | INTRAVENOUS | Status: AC | PRN
  Administered 2016-07-20: 100 mL via INTRAVENOUS

## 2016-07-21 ENCOUNTER — Telehealth: Payer: Self-pay | Admitting: Urology

## 2016-07-21 ENCOUNTER — Ambulatory Visit: Payer: Medicare HMO

## 2016-07-21 DIAGNOSIS — R590 Localized enlarged lymph nodes: Secondary | ICD-10-CM

## 2016-07-21 NOTE — Telephone Encounter (Signed)
CT scan reviewed today with Dr. Aletta Edouard, radiologist at multidisciplinary tumor board. Also present were Dr. Janese Banks and Dr.  Baruch Gouty.    CT scan shows extensive small bowel mesentery adenopathy measuring 4 x 8.6 along with a small inter aortocaval and retrocaval node which are less impressive.   There is no obvious pelvic lymphadenopathy but there are clips in the pelvis suggesting that he may have previously had pelvic lymph node dissection.  Given the appearance of the mass and very low PSA, there is some concern for a second primary process therefore biopsy was recommended.  Unfortunately, given the location of the mesenteric mass, transcutaneous biopsy is difficult but not impossible.  After further consideration, consensus was reached to proceed with PET scan to possibly identify lesions more amenable to biopsy.  This was discussed with the patient today by telephone and PET scan was ordered. Will follow up with him after this is completed.  Christian Espy, MD

## 2016-07-21 NOTE — Progress Notes (Signed)
Christian Kelly this is weird. I am out of town for most of next week. I cant see him before 2 weeks. We will discuss him tomorrow for add on. We should proceed with ir guided biopsy. Can you arrange that and I will see him post biopsy?

## 2016-07-21 NOTE — Progress Notes (Signed)
Bone scan was reviewed showing no evidence to suggest metastatic disease.interestingly his CT scan of abdomen and pelvis shows no evidence of local recurrent disease status post prostatectomy. He does have extensive abdominal adenopathy which could either be metastatic prostate cancer or lymphoma.We are holding his plan simulation today and we'll present his case at tumor conference. I believe we need to biopsy one of these abdominal lymph nodes to better clarify stage of his disease.If this is trulyaggressive metastatic prostate cancer radiation therapy would not be an option.

## 2016-07-25 ENCOUNTER — Encounter: Payer: Self-pay | Admitting: Internal Medicine

## 2016-07-25 ENCOUNTER — Other Ambulatory Visit: Payer: Self-pay

## 2016-07-25 DIAGNOSIS — R590 Localized enlarged lymph nodes: Secondary | ICD-10-CM

## 2016-08-02 ENCOUNTER — Other Ambulatory Visit: Payer: Self-pay | Admitting: *Deleted

## 2016-08-03 ENCOUNTER — Ambulatory Visit
Admission: RE | Admit: 2016-08-03 | Discharge: 2016-08-03 | Disposition: A | Discharge status: Home / Self Care | Payer: Medicare HMO | Source: Ambulatory Visit | Attending: Urology | Admitting: Urology

## 2016-08-03 DIAGNOSIS — I2584 Coronary atherosclerosis due to calcified coronary lesion: Secondary | ICD-10-CM | POA: Diagnosis not present

## 2016-08-03 DIAGNOSIS — I2510 Atherosclerotic heart disease of native coronary artery without angina pectoris: Secondary | ICD-10-CM | POA: Diagnosis not present

## 2016-08-03 DIAGNOSIS — I700 Atherosclerosis of aorta: Secondary | ICD-10-CM | POA: Diagnosis not present

## 2016-08-03 DIAGNOSIS — R590 Localized enlarged lymph nodes: Secondary | ICD-10-CM

## 2016-08-03 DIAGNOSIS — I880 Nonspecific mesenteric lymphadenitis: Secondary | ICD-10-CM | POA: Diagnosis not present

## 2016-08-03 LAB — GLUCOSE, CAPILLARY: Glucose-Capillary: 139 mg/dL — ABNORMAL HIGH (ref 65–99)

## 2016-08-03 MED ORDER — FLUDEOXYGLUCOSE F - 18 (FDG) INJECTION
12.1400 | Freq: Once | INTRAVENOUS | Status: AC | PRN
  Administered 2016-08-03: 12.14 via INTRAVENOUS

## 2016-08-08 ENCOUNTER — Inpatient Hospital Stay: Payer: Medicare HMO | Attending: Oncology | Admitting: Oncology

## 2016-08-08 ENCOUNTER — Encounter: Payer: Self-pay | Admitting: Oncology

## 2016-08-08 VITALS — BP 130/73 | HR 73 | Temp 95.8°F | Resp 18 | Wt 193.8 lb

## 2016-08-08 DIAGNOSIS — I2510 Atherosclerotic heart disease of native coronary artery without angina pectoris: Secondary | ICD-10-CM | POA: Insufficient documentation

## 2016-08-08 DIAGNOSIS — E785 Hyperlipidemia, unspecified: Secondary | ICD-10-CM | POA: Diagnosis not present

## 2016-08-08 DIAGNOSIS — Z7982 Long term (current) use of aspirin: Secondary | ICD-10-CM | POA: Diagnosis not present

## 2016-08-08 DIAGNOSIS — I129 Hypertensive chronic kidney disease with stage 1 through stage 4 chronic kidney disease, or unspecified chronic kidney disease: Secondary | ICD-10-CM | POA: Insufficient documentation

## 2016-08-08 DIAGNOSIS — N183 Chronic kidney disease, stage 3 (moderate): Secondary | ICD-10-CM | POA: Diagnosis not present

## 2016-08-08 DIAGNOSIS — Z79899 Other long term (current) drug therapy: Secondary | ICD-10-CM | POA: Insufficient documentation

## 2016-08-08 DIAGNOSIS — Z9079 Acquired absence of other genital organ(s): Secondary | ICD-10-CM | POA: Diagnosis not present

## 2016-08-08 DIAGNOSIS — Z85028 Personal history of other malignant neoplasm of stomach: Secondary | ICD-10-CM | POA: Diagnosis not present

## 2016-08-08 DIAGNOSIS — C61 Malignant neoplasm of prostate: Secondary | ICD-10-CM | POA: Diagnosis not present

## 2016-08-08 DIAGNOSIS — Z85038 Personal history of other malignant neoplasm of large intestine: Secondary | ICD-10-CM | POA: Insufficient documentation

## 2016-08-08 DIAGNOSIS — R590 Localized enlarged lymph nodes: Secondary | ICD-10-CM | POA: Diagnosis not present

## 2016-08-08 DIAGNOSIS — Z87891 Personal history of nicotine dependence: Secondary | ICD-10-CM | POA: Diagnosis not present

## 2016-08-08 DIAGNOSIS — R591 Generalized enlarged lymph nodes: Secondary | ICD-10-CM

## 2016-08-08 DIAGNOSIS — I700 Atherosclerosis of aorta: Secondary | ICD-10-CM | POA: Diagnosis not present

## 2016-08-08 DIAGNOSIS — E119 Type 2 diabetes mellitus without complications: Secondary | ICD-10-CM | POA: Insufficient documentation

## 2016-08-08 DIAGNOSIS — R599 Enlarged lymph nodes, unspecified: Secondary | ICD-10-CM

## 2016-08-08 NOTE — Addendum Note (Signed)
Addended by: Luella Cook on: 08/08/2016 03:16 PM   Modules accepted: Orders

## 2016-08-08 NOTE — Progress Notes (Signed)
Hematology/Oncology Consult note [facility]  Telephone:([phone] [phone]  Patient Care Team: Ezequiel Kayser, MD as PCP - General (Internal Medicine) Hollice Espy, MD as Consulting Physician (Urology)   Name of the patient: Christian Kelly  [id number]  [birth year]   Date of visit: 08/08/16  Diagnosis- 1. 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 Intra abdominal adenopathy  Chief complaint/ Reason for visit- discuss pet ct results  Heme/Onc history: Patient is a 76 yr old male with a h/o prostate cancer diagnosed in 1999 s/p radical prostatectomy. Prior to that he had colon cancer in 1998 s/o surgery and adjuvant chemotherapy in 1998. With regards to prostate cancer- surgery was done at [facility] in [place] and he was followed at [place] subsequently. Patient think his Gleasons score was 7 at diagnosis. He has not required any radiation therapy or ADT so far. Patient still spends 4-5 months at [place].   Dr. Raechel Ache has been monitoring his PSArecently  and his recent trend of PSA has been as follows: psa was 0.33 in feb 2017 and 0.63 in April 2018. We have 1 psa from 2015 when it was 0.3  Patient was referred to Dr. Erlene Quan urology and Dr. Baruch Gouty for salvage radiation.   Dr. Erlene Quan ordered CT abdomen which showed: IMPRESSION: 1. Status post prostatectomy, without locally recurrent disease. 2. Extensive abdominal adenopathy. Given the clinical history, most likely related to metastatic prostate carcinoma. Lymphoma could look similar. 3.  Coronary artery atherosclerosis. Aortic atherosclerosis. 4. Vague upper sacral sclerosis is felt unlikely to represent metastatic disease, given lack of correlate on yesterday's bone scan. Correlate with radiation therapy, as radiation induced necrosis could have this appearance. Recommend attention on follow-up.  This was followed by PET/CT scan which showed:  IMPRESSION: 1. Hypermetabolic gastrohepatic ligament, abdominal retroperitoneal and small bowel mesenteric adenopathy, most indicative of lymphoma. 2. Aortic atherosclerosis (ICD10-170.0). Coronary artery calcification.  Interval history- doing well. Denies any complaints  ECOG PS- 0  Review of systems- Review of Systems  Constitutional: Negative for chills, fever, malaise/fatigue and weight loss.  HENT: Negative for congestion, ear discharge and nosebleeds.   Eyes: Negative for blurred vision.  Respiratory: Negative for cough, hemoptysis, sputum production, shortness of breath and wheezing.   Cardiovascular: Negative for chest pain, palpitations, orthopnea and claudication.  Gastrointestinal: Negative for abdominal pain, blood in stool, constipation, diarrhea, heartburn, melena, nausea and vomiting.  Genitourinary: Negative for dysuria, flank pain, frequency, hematuria and urgency.  Musculoskeletal: Negative for back pain, joint pain and myalgias.  Skin: Negative for rash.  Neurological: Negative for dizziness, tingling, focal weakness, seizures, weakness and headaches.  Endo/Heme/Allergies: Does not bruise/bleed easily.  Psychiatric/Behavioral: Negative for depression and suicidal ideas. The patient does not have insomnia.       No Known Allergies   Past Medical History:  Diagnosis Date  . Basal cell carcinoma 2008   forehead  . Cancer ([place])   . Chronic renal insufficiency, stage III (moderate)   . Colon cancer ([place]) 1998  . Diabetes mellitus without complication ([place])   . Hyperlipemia   . Hypertension   . Lyme disease   . Prostate cancer ([place]) 1999     Past Surgical History:  Procedure Laterality Date  . COLON SURGERY  1998  . COLONOSCOPY    . COLONOSCOPY WITH PROPOFOL N/A 12/29/2014   Procedure: COLONOSCOPY WITH PROPOFOL;  Surgeon: Lollie Sails, MD;  Location: [facility] ENDOSCOPY;  Service: Endoscopy;  Laterality: N/A;  . EYE  SURGERY    . FRACTURE SURGERY    .  LITHOTRIPSY    . PROSTATECTOMY  1998  . TONSILLECTOMY      Social History   Social History  . Marital status: Married    Spouse name: N/A  . Number of children: N/A  . Years of education: N/A   Occupational History  . Not on file.   Social History Main Topics  . Smoking status: Former Smoker    Packs/day: 1.00    Years: 8.00    Types: Cigarettes  . Smokeless tobacco: Former Systems developer    Types: Snuff  . Alcohol use Yes     Comment: rarely-one or two beer  . Drug use: No  . Sexual activity: Not Currently   Other Topics Concern  . Not on file   Social History Narrative  . No narrative on file    Family History  Problem Relation Age of Onset  . Coronary artery disease Mother   . Diabetes Mother   . Prostate cancer Father   . Pancreatitis Father   . Bladder Cancer Sister   . Diabetes Brother   . Diabetes Brother   . Prostate cancer Brother   . Diabetes Brother   . Diabetes Maternal Grandmother      Current Outpatient Prescriptions:  .  aspirin EC 81 MG tablet, Take 81 mg by mouth every other day. , Disp: , Rfl:  .  atorvastatin (LIPITOR) 40 MG tablet, Take 40 mg by mouth daily., Disp: , Rfl:  .  hydrochlorothiazide (HYDRODIURIL) 25 MG tablet, Take 25 mg by mouth daily., Disp: , Rfl:  .  lisinopril (PRINIVIL,ZESTRIL) 10 MG tablet, Take 10 mg by mouth daily., Disp: , Rfl:  .  Multiple Vitamin (MULTIVITAMIN) capsule, Take 1 capsule by mouth daily., Disp: , Rfl:  .  Probiotic Product (PROBIOTIC PO), Take 1 capsule by mouth., Disp: , Rfl:   Physical exam:  Vitals:   08/08/16 1031  BP: 130/73  Pulse: 73  Resp: 18  Temp: (!) 95.8 F (35.4 C)  TempSrc: Tympanic  Weight: 193 lb 12.6 oz (87.9 kg)   Physical Exam  Constitutional: He is oriented to person, place, and time and well-developed, well-nourished, and in no distress.  HENT:  Head: Normocephalic and atraumatic.  Eyes: EOM are normal. Pupils are equal, round, and reactive to light.  Neck: Normal range of  motion.  Cardiovascular: Normal rate, regular rhythm and normal heart sounds.   Pulmonary/Chest: Effort normal and breath sounds normal.  Abdominal: Soft. Bowel sounds are normal.  Neurological: He is alert and oriented to person, place, and time.  Skin: Skin is warm and dry.     CMP Latest Ref Rng & Units 07/11/2016  Glucose 65 - 99 mg/dL 163(H)  BUN 6 - 20 mg/dL 29(H)  Creatinine 0.61 - 1.24 mg/dL 1.10  Sodium 135 - 145 mmol/L 136  Potassium 3.5 - 5.1 mmol/L 3.7  Chloride 101 - 111 mmol/L 101  CO2 22 - 32 mmol/L 26  Calcium 8.9 - 10.3 mg/dL 9.0  Total Protein 6.5 - 8.1 g/dL 7.5  Total Bilirubin 0.3 - 1.2 mg/dL 0.7  Alkaline Phos 38 - 126 U/L 86  AST 15 - 41 U/L 25  ALT 17 - 63 U/L 27   CBC Latest Ref Rng & Units 07/11/2016  WBC 3.8 - 10.6 K/uL 9.2  Hemoglobin 13.0 - 18.0 g/dL 15.7  Hematocrit 40.0 - 52.0 % 46.4  Platelets 150 - 440 K/uL 232  No images are attached to the encounter.  Nm Bone Scan Whole Body  Result Date: 07/19/2016 CLINICAL DATA:  Prostate cancer post prostatectomy in 1989, elevated/increasing PSA, history colon cancer, basal cell cancer of the forehead, sharp low back pain, old fractures LEFT forearm, LEFT wrist, RIGHT foot, stage III chronic renal insufficiency, hypertension, diabetes mellitus EXAM: NUCLEAR MEDICINE WHOLE BODY BONE SCAN TECHNIQUE: Whole body anterior and posterior images were obtained approximately 3 hours after intravenous injection of radiopharmaceutical. RADIOPHARMACEUTICALS:  22.69 mCi Technetium-98m MDP IV COMPARISON:  None Radiographic correlation: None FINDINGS: Uptake at the shoulders RIGHT greater than LEFT, sternoclavicular joints, knees, and RIGHT foot, typically degenerative. Minimal uptake at the lateral LEFT ankle, question prior trauma. Uptake at the RIGHT eleventh costovertebral junction and RIGHT first costochondral junction typically degenerative. No additional abnormal sites of osseous tracer accumulation are identified that  are suspicious for osseous metastatic disease. Expected urinary tract and soft tissue distribution of tracer. IMPRESSION: Suspected mild areas of degenerative uptake as above. No definite scintigraphic evidence of osseous metastatic disease. Electronically Signed   By: Lavonia Dana M.D.   On: 07/19/2016 14:41   Ct Abdomen Pelvis W Contrast  Result Date: 07/20/2016 CLINICAL DATA:  Colon cancer and resection with chemotherapy in 1998. Prostate cancer with resection in 1988. No complaints today. Rising PSA. EXAM: CT ABDOMEN AND PELVIS WITH CONTRAST TECHNIQUE: Multidetector CT imaging of the abdomen and pelvis was performed using the standard protocol following bolus administration of intravenous contrast. CONTRAST:  147mL ISOVUE-300 IOPAMIDOL (ISOVUE-300) INJECTION 61% COMPARISON:  Bone scan of 1 day prior. FINDINGS: Lower chest: Clear lung bases. Normal heart size without pericardial or pleural effusion. Right coronary artery atherosclerosis. Hepatobiliary: Normal liver. Normal gallbladder, without biliary ductal dilatation. Pancreas: Normal, without mass or ductal dilatation. Spleen: Normal in size, without focal abnormality. Adrenals/Urinary Tract: Normal adrenal glands. Left renal cysts. Lower pole left renal sinus cysts. Interpolar too small to characterize renal lesion. No hydronephrosis. Normal urinary bladder. Stomach/Bowel: Normal stomach, without wall thickening. Normal colon, appendix, and terminal ileum. Normal small bowel. Vascular/Lymphatic: Aortic and branch vessel atherosclerosis. Retroperitoneal adenopathy is mild, with an aortocaval node measuring 10 mm on image 36/series 2. A retrocaval node measures 13 mm on image 31/series 2 A gastrohepatic ligament node is likely necrotic centrally and measures 1.6 cm on image 19/series 2. Extensive small bowel mesenteric adenopathy, with a nodal mass measuring on the order of 4.0 x 7.8 cm on image 42/series 2. Prior pelvic sidewall node dissection, without  sidewall adenopathy. Reproductive: Prostatectomy, without locally recurrent disease. Other: No significant free fluid. Musculoskeletal: Degenerative partial fusion of the left sacroiliac joint. Vague sclerosis involving the upper sacrum, including on sagittal image 86, is without correlate on bone scan. IMPRESSION: 1. Status post prostatectomy, without locally recurrent disease. 2. Extensive abdominal adenopathy. Given the clinical history, most likely related to metastatic prostate carcinoma. Lymphoma could look similar. 3.  Coronary artery atherosclerosis. Aortic atherosclerosis. 4. Vague upper sacral sclerosis is felt unlikely to represent metastatic disease, given lack of correlate on yesterday's bone scan. Correlate with radiation therapy, as radiation induced necrosis could have this appearance. Recommend attention on follow-up. Electronically Signed   By: Abigail Miyamoto M.D.   On: 07/20/2016 13:54   Nm Pet Image Initial (pi) Skull Base To Thigh  Result Date: 08/03/2016 CLINICAL DATA:  Initial treatment strategy for mesenteric adenopathy. EXAM: NUCLEAR MEDICINE PET SKULL BASE TO THIGH TECHNIQUE: 12.1 mCi F-18 FDG was injected intravenously. Full-ring PET imaging was performed from the skull  base to thigh after the radiotracer. CT data was obtained and used for attenuation correction and anatomic localization. FASTING BLOOD GLUCOSE:  Value: 139 mg/dl COMPARISON:  CT abdomen pelvis 07/20/2016 and bone scan 07/19/2016. FINDINGS: NECK No hypermetabolic lymph nodes in the neck. CT images show no acute findings. CHEST Not hypermetabolic mediastinal, hilar or axillary lymph nodes. No hypermetabolic pulmonary nodules. Atherosclerotic calcification of the aorta and coronary arteries. Heart is at the upper limits of normal in size. No pericardial or pleural effusion. ABDOMEN/PELVIS Gastrohepatic ligament lymph node measures 1.7 cm with an SUV max of 10.0. There are hypermetabolic retroperitoneal lymph nodes. Index  retrocaval lymph node measures 10 mm (CT image 188) with an SUV max of 4.1. Matted mesenteric adenopathy measures up to 3.7 x 6.5 cm (CT image 202) with an SUV max of 11.5. No abnormal hypermetabolism in the liver, adrenal glands, spleen or pancreas. Liver, gallbladder and adrenal glands are unremarkable. Low-attenuation lesions in the kidneys measure up to 1.8 cm on the left, difficult to further characterize without post-contrast imaging. Spleen, pancreas, stomach and bowel are grossly unremarkable. Atherosclerotic calcification of the aorta. Prostatectomy. SKELETON No abnormal osseous hypermetabolism. IMPRESSION: 1. Hypermetabolic gastrohepatic ligament, abdominal retroperitoneal and small bowel mesenteric adenopathy, most indicative of lymphoma. 2. Aortic atherosclerosis (ICD10-170.0). Coronary artery calcification. Electronically Signed   By: Lorin Picket M.D.   On: 08/03/2016 11:41     Assessment and plan- Patient is a 76 y.o. male with castrate sensitive prostate cancer and now with biochemical recurrence found to have intra abdominal adenopathy on CT and PET  I personally reviewed CT and PET CT images and discussed them with the patient. Patients PSA is 0.6 and he is castrate sensitive. His intra abdominal adenopathy would therefore not be expected to be from his prostate cancer. Radiology findings suggestive of lymphoma. We discussed his case at out tumor board and consensus was to proceed with biopsy. I will obtain CT guided biopsy of his LN although it may be tricky depending on bowel position but IR willing to try. If IR guided biopsy not successful, he will need laparoscopy biopsy for further valuation. I will see him back in 2 weeks time to discuss biopsy results. Discussed possible outcomes of biopsy from metastatic prostate cancer to low grade or high grade lymphoma versus some other etiology. Patient verbalized understanding   Total face to face encounter time for this patient visit was  30 min. >50% of the time was  spent in counseling and coordination of care.     Visit Diagnosis 1. Prostate cancer ([place])   2. Adenopathy      Dr. Randa Evens, MD, MPH [facility] Pager- [phone] 08/08/2016 8:41 AM

## 2016-08-08 NOTE — Progress Notes (Signed)
Here for follow up

## 2016-08-10 ENCOUNTER — Telehealth: Payer: Self-pay | Admitting: *Deleted

## 2016-08-10 NOTE — Telephone Encounter (Signed)
Called and got voicemail and I tried the cell phone and got voicemail alos and left info about bx with instructions and then called back to hom enumber to leave same message and pt answered the phone.  Called patient and gave them date of procedure __6/22_______ and time____9:30____.  Patient can't eat or drink anythig for 8 hours prior to the time of procedure.  Must have a driver because of the medication given during procedure.  If you have to take medications the morning of the procedure take it with a sip of water only. Patient should arrive at 8:30 to register at [place]. At [facility].  Pt is agreeable.

## 2016-08-11 ENCOUNTER — Other Ambulatory Visit: Payer: Self-pay | Admitting: Radiology

## 2016-08-12 ENCOUNTER — Ambulatory Visit
Admission: RE | Admit: 2016-08-12 | Discharge: 2016-08-12 | Disposition: A | Discharge status: Home / Self Care | Payer: Medicare HMO | Source: Ambulatory Visit | Attending: Oncology | Admitting: Oncology

## 2016-08-12 DIAGNOSIS — Z85038 Personal history of other malignant neoplasm of large intestine: Secondary | ICD-10-CM | POA: Diagnosis not present

## 2016-08-12 DIAGNOSIS — Z833 Family history of diabetes mellitus: Secondary | ICD-10-CM | POA: Diagnosis not present

## 2016-08-12 DIAGNOSIS — I129 Hypertensive chronic kidney disease with stage 1 through stage 4 chronic kidney disease, or unspecified chronic kidney disease: Secondary | ICD-10-CM | POA: Diagnosis not present

## 2016-08-12 DIAGNOSIS — Z8249 Family history of ischemic heart disease and other diseases of the circulatory system: Secondary | ICD-10-CM | POA: Diagnosis not present

## 2016-08-12 DIAGNOSIS — Z8052 Family history of malignant neoplasm of bladder: Secondary | ICD-10-CM | POA: Insufficient documentation

## 2016-08-12 DIAGNOSIS — Z87891 Personal history of nicotine dependence: Secondary | ICD-10-CM | POA: Diagnosis not present

## 2016-08-12 DIAGNOSIS — Z85828 Personal history of other malignant neoplasm of skin: Secondary | ICD-10-CM | POA: Insufficient documentation

## 2016-08-12 DIAGNOSIS — R599 Enlarged lymph nodes, unspecified: Secondary | ICD-10-CM

## 2016-08-12 DIAGNOSIS — E1122 Type 2 diabetes mellitus with diabetic chronic kidney disease: Secondary | ICD-10-CM | POA: Insufficient documentation

## 2016-08-12 DIAGNOSIS — C8333 Diffuse large B-cell lymphoma, intra-abdominal lymph nodes: Secondary | ICD-10-CM | POA: Insufficient documentation

## 2016-08-12 DIAGNOSIS — Z87442 Personal history of urinary calculi: Secondary | ICD-10-CM | POA: Diagnosis not present

## 2016-08-12 DIAGNOSIS — N183 Chronic kidney disease, stage 3 (moderate): Secondary | ICD-10-CM | POA: Diagnosis not present

## 2016-08-12 DIAGNOSIS — Z9889 Other specified postprocedural states: Secondary | ICD-10-CM | POA: Diagnosis not present

## 2016-08-12 DIAGNOSIS — C61 Malignant neoplasm of prostate: Secondary | ICD-10-CM | POA: Insufficient documentation

## 2016-08-12 DIAGNOSIS — Z79899 Other long term (current) drug therapy: Secondary | ICD-10-CM | POA: Diagnosis not present

## 2016-08-12 DIAGNOSIS — R591 Generalized enlarged lymph nodes: Secondary | ICD-10-CM | POA: Insufficient documentation

## 2016-08-12 DIAGNOSIS — Z8042 Family history of malignant neoplasm of prostate: Secondary | ICD-10-CM | POA: Insufficient documentation

## 2016-08-12 DIAGNOSIS — E785 Hyperlipidemia, unspecified: Secondary | ICD-10-CM | POA: Insufficient documentation

## 2016-08-12 DIAGNOSIS — Z7982 Long term (current) use of aspirin: Secondary | ICD-10-CM | POA: Insufficient documentation

## 2016-08-12 DIAGNOSIS — R590 Localized enlarged lymph nodes: Secondary | ICD-10-CM | POA: Diagnosis not present

## 2016-08-12 HISTORY — DX: Personal history of urinary calculi: Z87.442

## 2016-08-12 HISTORY — DX: Unspecified osteoarthritis, unspecified site: M19.90

## 2016-08-12 LAB — CBC
HEMATOCRIT: 43 % (ref 40.0–52.0)
Hemoglobin: 14.8 g/dL (ref 13.0–18.0)
MCH: 31.3 pg (ref 26.0–34.0)
MCHC: 34.5 g/dL (ref 32.0–36.0)
MCV: 90.7 fL (ref 80.0–100.0)
PLATELETS: 215 10*3/uL (ref 150–440)
RBC: 4.74 MIL/uL (ref 4.40–5.90)
RDW: 12.5 % (ref 11.5–14.5)
WBC: 8.1 10*3/uL (ref 3.8–10.6)

## 2016-08-12 LAB — APTT: APTT: 27 s (ref 24–36)

## 2016-08-12 LAB — PROTIME-INR
INR: 1.03
Prothrombin Time: 13.5 seconds (ref 11.4–15.2)

## 2016-08-12 MED ORDER — MIDAZOLAM HCL 5 MG/5ML IJ SOLN
INTRAMUSCULAR | Status: AC
  Filled 2016-08-12: qty 5

## 2016-08-12 MED ORDER — MIDAZOLAM HCL 5 MG/5ML IJ SOLN
INTRAMUSCULAR | Status: AC | PRN
  Administered 2016-08-12: 1 mg via INTRAVENOUS

## 2016-08-12 MED ORDER — SODIUM CHLORIDE 0.9 % IV SOLN
INTRAVENOUS | Status: DC
  Administered 2016-08-12: 10:00:00 via INTRAVENOUS

## 2016-08-12 MED ORDER — FENTANYL CITRATE (PF) 100 MCG/2ML IJ SOLN
INTRAMUSCULAR | Status: AC | PRN
  Administered 2016-08-12: 50 ug via INTRAVENOUS

## 2016-08-12 MED ORDER — FENTANYL CITRATE (PF) 100 MCG/2ML IJ SOLN
INTRAMUSCULAR | Status: AC
  Filled 2016-08-12: qty 4

## 2016-08-12 NOTE — Procedures (Signed)
Mesenteric LN Bx 18 g core times three EBL 0 Comp 0

## 2016-08-12 NOTE — H&P (Signed)
Chief Complaint: Patient was seen in consultation today for No chief complaint on file.  at the request of Rao,Archana C  Referring Physician(s): Rao,Archana C  Supervising Physician: Marybelle Killings  Patient Status: [facility] - Out-pt  History of Present Illness: Christian Kelly is a 76 y.o. male with a history of prostate cancer who presented with abnormal abdominal adenopathy. He is referred for core biopsy. He also has a history of Lyme's disease.  Past Medical History:  Diagnosis Date  . Arthritis   . Basal cell carcinoma 2008   forehead  . Cancer ([place])   . Chronic renal insufficiency, stage III (moderate)   . Colon cancer ([place]) 1998  . Diabetes mellitus without complication ([place])   . History of kidney stones   . Hyperlipemia   . Hypertension   . Lyme disease   . Prostate cancer ([place]) 1999    Past Surgical History:  Procedure Laterality Date  . COLON SURGERY  1998  . COLONOSCOPY    . COLONOSCOPY WITH PROPOFOL N/A 12/29/2014   Procedure: COLONOSCOPY WITH PROPOFOL;  Surgeon: Lollie Sails, MD;  Location: [facility] ENDOSCOPY;  Service: Endoscopy;  Laterality: N/A;  . EYE SURGERY    . FRACTURE SURGERY    . LITHOTRIPSY    . PROSTATECTOMY  1998  . TONSILLECTOMY      Allergies: Patient has no known allergies.  Medications: Prior to Admission medications   Medication Sig Start Date End Date Taking? Authorizing Provider  aspirin EC 81 MG tablet Take 81 mg by mouth every other day.    Yes [provider]  atorvastatin (LIPITOR) 40 MG tablet Take 40 mg by mouth daily.   Yes [provider]  hydrochlorothiazide (HYDRODIURIL) 25 MG tablet Take 25 mg by mouth daily.   Yes [provider]  lisinopril (PRINIVIL,ZESTRIL) 10 MG tablet Take 10 mg by mouth daily.   Yes [provider]  Multiple Vitamin (MULTIVITAMIN) capsule Take 1 capsule by mouth daily.   Yes [provider]  Probiotic Product (PROBIOTIC PO) Take 1 capsule by mouth.     [provider]     Family History  Problem Relation Age of Onset  . Coronary artery disease Mother   . Diabetes Mother   . Prostate cancer Father   . Pancreatitis Father   . Bladder Cancer Sister   . Diabetes Brother   . Diabetes Brother   . Prostate cancer Brother   . Diabetes Brother   . Diabetes Maternal Grandmother     Social History   Social History  . Marital status: Married    Spouse name: N/A  . Number of children: N/A  . Years of education: N/A   Social History Main Topics  . Smoking status: Former Smoker    Packs/day: 1.00    Years: 8.00    Types: Cigarettes    Quit date: 08/13/1974  . Smokeless tobacco: Former Systems developer    Types: Snuff  . Alcohol use Yes     Comment: rarely-one or two beer  . Drug use: No  . Sexual activity: Not Currently   Other Topics Concern  . None   Social History Narrative  . None     Review of Systems: A 12 point ROS discussed and pertinent positives are indicated in the HPI above.  All other systems are negative.  Review of Systems  Vital Signs: BP 133/75   Pulse 69   Temp 98.1 F (36.7 C) (Oral)   Resp 18  Ht 6' (1.829 m)   SpO2 97%   Physical Exam  Constitutional: He is oriented to person, place, and time. He appears well-developed and well-nourished.  HENT:  Head: Normocephalic and atraumatic.  Cardiovascular: Normal rate and regular rhythm.   Pulmonary/Chest: Effort normal and breath sounds normal.  Neurological: He is alert and oriented to person, place, and time.    Mallampati Score:   1  Imaging: Nm Bone Scan Whole Body  Result Date: 07/19/2016 CLINICAL DATA:  Prostate cancer post prostatectomy in 1989, elevated/increasing PSA, history colon cancer, basal cell cancer of the forehead, sharp low back pain, old fractures LEFT forearm, LEFT wrist, RIGHT foot, stage III chronic renal insufficiency, hypertension, diabetes mellitus EXAM: NUCLEAR MEDICINE WHOLE BODY BONE SCAN TECHNIQUE: Whole body  anterior and posterior images were obtained approximately 3 hours after intravenous injection of radiopharmaceutical. RADIOPHARMACEUTICALS:  22.69 mCi Technetium-38m MDP IV COMPARISON:  None Radiographic correlation: None FINDINGS: Uptake at the shoulders RIGHT greater than LEFT, sternoclavicular joints, knees, and RIGHT foot, typically degenerative. Minimal uptake at the lateral LEFT ankle, question prior trauma. Uptake at the RIGHT eleventh costovertebral junction and RIGHT first costochondral junction typically degenerative. No additional abnormal sites of osseous tracer accumulation are identified that are suspicious for osseous metastatic disease. Expected urinary tract and soft tissue distribution of tracer. IMPRESSION: Suspected mild areas of degenerative uptake as above. No definite scintigraphic evidence of osseous metastatic disease. Electronically Signed   By: Lavonia Dana M.D.   On: 07/19/2016 14:41   Ct Abdomen Pelvis W Contrast  Result Date: 07/20/2016 CLINICAL DATA:  Colon cancer and resection with chemotherapy in 1998. Prostate cancer with resection in 1988. No complaints today. Rising PSA. EXAM: CT ABDOMEN AND PELVIS WITH CONTRAST TECHNIQUE: Multidetector CT imaging of the abdomen and pelvis was performed using the standard protocol following bolus administration of intravenous contrast. CONTRAST:  111mL ISOVUE-300 IOPAMIDOL (ISOVUE-300) INJECTION 61% COMPARISON:  Bone scan of 1 day prior. FINDINGS: Lower chest: Clear lung bases. Normal heart size without pericardial or pleural effusion. Right coronary artery atherosclerosis. Hepatobiliary: Normal liver. Normal gallbladder, without biliary ductal dilatation. Pancreas: Normal, without mass or ductal dilatation. Spleen: Normal in size, without focal abnormality. Adrenals/Urinary Tract: Normal adrenal glands. Left renal cysts. Lower pole left renal sinus cysts. Interpolar too small to characterize renal lesion. No hydronephrosis. Normal urinary  bladder. Stomach/Bowel: Normal stomach, without wall thickening. Normal colon, appendix, and terminal ileum. Normal small bowel. Vascular/Lymphatic: Aortic and branch vessel atherosclerosis. Retroperitoneal adenopathy is mild, with an aortocaval node measuring 10 mm on image 36/series 2. A retrocaval node measures 13 mm on image 31/series 2 A gastrohepatic ligament node is likely necrotic centrally and measures 1.6 cm on image 19/series 2. Extensive small bowel mesenteric adenopathy, with a nodal mass measuring on the order of 4.0 x 7.8 cm on image 42/series 2. Prior pelvic sidewall node dissection, without sidewall adenopathy. Reproductive: Prostatectomy, without locally recurrent disease. Other: No significant free fluid. Musculoskeletal: Degenerative partial fusion of the left sacroiliac joint. Vague sclerosis involving the upper sacrum, including on sagittal image 86, is without correlate on bone scan. IMPRESSION: 1. Status post prostatectomy, without locally recurrent disease. 2. Extensive abdominal adenopathy. Given the clinical history, most likely related to metastatic prostate carcinoma. Lymphoma could look similar. 3.  Coronary artery atherosclerosis. Aortic atherosclerosis. 4. Vague upper sacral sclerosis is felt unlikely to represent metastatic disease, given lack of correlate on yesterday's bone scan. Correlate with radiation therapy, as radiation induced necrosis could have this appearance. Recommend attention  on follow-up. Electronically Signed   By: Abigail Miyamoto M.D.   On: 07/20/2016 13:54   Nm Pet Image Initial (pi) Skull Base To Thigh  Result Date: 08/03/2016 CLINICAL DATA:  Initial treatment strategy for mesenteric adenopathy. EXAM: NUCLEAR MEDICINE PET SKULL BASE TO THIGH TECHNIQUE: 12.1 mCi F-18 FDG was injected intravenously. Full-ring PET imaging was performed from the skull base to thigh after the radiotracer. CT data was obtained and used for attenuation correction and anatomic  localization. FASTING BLOOD GLUCOSE:  Value: 139 mg/dl COMPARISON:  CT abdomen pelvis 07/20/2016 and bone scan 07/19/2016. FINDINGS: NECK No hypermetabolic lymph nodes in the neck. CT images show no acute findings. CHEST Not hypermetabolic mediastinal, hilar or axillary lymph nodes. No hypermetabolic pulmonary nodules. Atherosclerotic calcification of the aorta and coronary arteries. Heart is at the upper limits of normal in size. No pericardial or pleural effusion. ABDOMEN/PELVIS Gastrohepatic ligament lymph node measures 1.7 cm with an SUV max of 10.0. There are hypermetabolic retroperitoneal lymph nodes. Index retrocaval lymph node measures 10 mm (CT image 188) with an SUV max of 4.1. Matted mesenteric adenopathy measures up to 3.7 x 6.5 cm (CT image 202) with an SUV max of 11.5. No abnormal hypermetabolism in the liver, adrenal glands, spleen or pancreas. Liver, gallbladder and adrenal glands are unremarkable. Low-attenuation lesions in the kidneys measure up to 1.8 cm on the left, difficult to further characterize without post-contrast imaging. Spleen, pancreas, stomach and bowel are grossly unremarkable. Atherosclerotic calcification of the aorta. Prostatectomy. SKELETON No abnormal osseous hypermetabolism. IMPRESSION: 1. Hypermetabolic gastrohepatic ligament, abdominal retroperitoneal and small bowel mesenteric adenopathy, most indicative of lymphoma. 2. Aortic atherosclerosis (ICD10-170.0). Coronary artery calcification. Electronically Signed   By: Lorin Picket M.D.   On: 08/03/2016 11:41    Labs:  CBC:  Recent Labs  07/11/16 1111 08/12/16 0840  WBC 9.2 8.1  HGB 15.7 14.8  HCT 46.4 43.0  PLT 232 215    COAGS: No results for input(s): INR, APTT in the last 8760 hours.  BMP:  Recent Labs  07/11/16 1111  NA 136  K 3.7  CL 101  CO2 26  GLUCOSE 163*  BUN 29*  CALCIUM 9.0  CREATININE 1.10  GFRNONAA >60  GFRAA >60    LIVER FUNCTION TESTS:  Recent Labs  07/11/16 1111    BILITOT 0.7  AST 25  ALT 27  ALKPHOS 86  PROT 7.5  ALBUMIN 4.0    TUMOR MARKERS: No results for input(s): AFPTM, CEA, CA199, CHROMGRNA in the last 8760 hours.  Assessment and Plan:  Abdominal adenopathy. CT guided core biopsy to follow.  Thank you for this interesting consult.  I greatly enjoyed meeting Racer Quam and look forward to participating in their care.  A copy of this report was sent to the requesting provider on this date.  Electronically Signed: Jordan Caraveo, ART A, MD 08/12/2016, 9:28 AM   I spent a total of  40 Minutes   in face to face in clinical consultation, greater than 50% of which was counseling/coordinating care for CT guided lyph node biopsy.

## 2016-08-19 ENCOUNTER — Encounter: Payer: Self-pay | Admitting: *Deleted

## 2016-08-22 ENCOUNTER — Telehealth: Payer: Self-pay | Admitting: *Deleted

## 2016-08-22 NOTE — Telephone Encounter (Addendum)
Dr Dicie Beam called path report to Dr Janese Banks of CT guided abd lymph node biopsy.Diagnosis : Large B Cell Lymphoma CD 10 positive GCB type

## 2016-08-23 ENCOUNTER — Other Ambulatory Visit: Payer: Self-pay | Admitting: Oncology

## 2016-08-23 ENCOUNTER — Encounter: Payer: Self-pay | Admitting: Oncology

## 2016-08-23 ENCOUNTER — Inpatient Hospital Stay: Payer: Medicare HMO

## 2016-08-23 ENCOUNTER — Inpatient Hospital Stay: Payer: Medicare HMO | Attending: Oncology | Admitting: Oncology

## 2016-08-23 VITALS — BP 128/81 | HR 68 | Temp 96.1°F | Resp 18 | Wt 197.5 lb

## 2016-08-23 DIAGNOSIS — Z87891 Personal history of nicotine dependence: Secondary | ICD-10-CM | POA: Insufficient documentation

## 2016-08-23 DIAGNOSIS — Z9079 Acquired absence of other genital organ(s): Secondary | ICD-10-CM

## 2016-08-23 DIAGNOSIS — Z79899 Other long term (current) drug therapy: Secondary | ICD-10-CM | POA: Diagnosis not present

## 2016-08-23 DIAGNOSIS — Z85828 Personal history of other malignant neoplasm of skin: Secondary | ICD-10-CM

## 2016-08-23 DIAGNOSIS — C61 Malignant neoplasm of prostate: Secondary | ICD-10-CM | POA: Insufficient documentation

## 2016-08-23 DIAGNOSIS — Z7982 Long term (current) use of aspirin: Secondary | ICD-10-CM | POA: Insufficient documentation

## 2016-08-23 DIAGNOSIS — E119 Type 2 diabetes mellitus without complications: Secondary | ICD-10-CM | POA: Insufficient documentation

## 2016-08-23 DIAGNOSIS — I700 Atherosclerosis of aorta: Secondary | ICD-10-CM | POA: Diagnosis not present

## 2016-08-23 DIAGNOSIS — Z85048 Personal history of other malignant neoplasm of rectum, rectosigmoid junction, and anus: Secondary | ICD-10-CM | POA: Insufficient documentation

## 2016-08-23 DIAGNOSIS — D709 Neutropenia, unspecified: Secondary | ICD-10-CM | POA: Insufficient documentation

## 2016-08-23 DIAGNOSIS — Z8052 Family history of malignant neoplasm of bladder: Secondary | ICD-10-CM | POA: Diagnosis not present

## 2016-08-23 DIAGNOSIS — Z8042 Family history of malignant neoplasm of prostate: Secondary | ICD-10-CM | POA: Diagnosis not present

## 2016-08-23 DIAGNOSIS — Z9221 Personal history of antineoplastic chemotherapy: Secondary | ICD-10-CM | POA: Diagnosis not present

## 2016-08-23 DIAGNOSIS — Z7689 Persons encountering health services in other specified circumstances: Secondary | ICD-10-CM | POA: Insufficient documentation

## 2016-08-23 DIAGNOSIS — Z923 Personal history of irradiation: Secondary | ICD-10-CM | POA: Diagnosis not present

## 2016-08-23 DIAGNOSIS — Z85038 Personal history of other malignant neoplasm of large intestine: Secondary | ICD-10-CM

## 2016-08-23 DIAGNOSIS — Z5112 Encounter for antineoplastic immunotherapy: Secondary | ICD-10-CM | POA: Insufficient documentation

## 2016-08-23 DIAGNOSIS — I2510 Atherosclerotic heart disease of native coronary artery without angina pectoris: Secondary | ICD-10-CM | POA: Insufficient documentation

## 2016-08-23 DIAGNOSIS — N183 Chronic kidney disease, stage 3 (moderate): Secondary | ICD-10-CM | POA: Insufficient documentation

## 2016-08-23 DIAGNOSIS — I129 Hypertensive chronic kidney disease with stage 1 through stage 4 chronic kidney disease, or unspecified chronic kidney disease: Secondary | ICD-10-CM | POA: Insufficient documentation

## 2016-08-23 DIAGNOSIS — K121 Other forms of stomatitis: Secondary | ICD-10-CM | POA: Insufficient documentation

## 2016-08-23 DIAGNOSIS — C8333 Diffuse large B-cell lymphoma, intra-abdominal lymph nodes: Secondary | ICD-10-CM | POA: Insufficient documentation

## 2016-08-23 DIAGNOSIS — E785 Hyperlipidemia, unspecified: Secondary | ICD-10-CM | POA: Diagnosis not present

## 2016-08-23 DIAGNOSIS — Z5111 Encounter for antineoplastic chemotherapy: Secondary | ICD-10-CM | POA: Insufficient documentation

## 2016-08-23 LAB — COMPREHENSIVE METABOLIC PANEL
ALBUMIN: 4.2 g/dL (ref 3.5–5.0)
ALT: 23 U/L (ref 17–63)
AST: 23 U/L (ref 15–41)
Alkaline Phosphatase: 89 U/L (ref 38–126)
Anion gap: 8 (ref 5–15)
BUN: 27 mg/dL — AB (ref 6–20)
CHLORIDE: 99 mmol/L — AB (ref 101–111)
CO2: 29 mmol/L (ref 22–32)
CREATININE: 1.37 mg/dL — AB (ref 0.61–1.24)
Calcium: 9.4 mg/dL (ref 8.9–10.3)
GFR calc Af Amer: 57 mL/min — ABNORMAL LOW (ref >60)
GFR, EST NON AFRICAN AMERICAN: 50 mL/min — AB (ref >60)
GLUCOSE: 175 mg/dL — AB (ref 65–99)
Potassium: 3.8 mmol/L (ref 3.5–5.1)
Sodium: 136 mmol/L (ref 135–145)
Total Bilirubin: 0.8 mg/dL (ref 0.3–1.2)
Total Protein: 7.6 g/dL (ref 6.5–8.1)

## 2016-08-23 LAB — LACTATE DEHYDROGENASE: LDH: 137 U/L (ref 98–192)

## 2016-08-23 MED ORDER — ALLOPURINOL 300 MG PO TABS: 300.0000 mg | ORAL_TABLET | Freq: Every day | ORAL | 3 refills | Status: DC

## 2016-08-23 MED ORDER — PREDNISONE 20 MG PO TABS: 100.0000 mg | ORAL_TABLET | Freq: Every day | ORAL | 0 refills | Status: DC

## 2016-08-23 MED ORDER — PROCHLORPERAZINE MALEATE 10 MG PO TABS: 10.0000 mg | ORAL_TABLET | Freq: Four times a day (QID) | ORAL | 6 refills | Status: DC | PRN

## 2016-08-23 MED ORDER — LORAZEPAM 0.5 MG PO TABS: 0.5000 mg | ORAL_TABLET | Freq: Four times a day (QID) | ORAL | 0 refills | Status: DC | PRN

## 2016-08-23 MED ORDER — LIDOCAINE-PRILOCAINE 2.5-2.5 % EX CREA: TOPICAL_CREAM | CUTANEOUS | 3 refills | Status: DC

## 2016-08-23 MED ORDER — ONDANSETRON HCL 8 MG PO TABS: 8.0000 mg | ORAL_TABLET | Freq: Two times a day (BID) | ORAL | 1 refills | Status: DC | PRN

## 2016-08-23 NOTE — Progress Notes (Signed)
Patient offers no complaints today.  Patient here today for biopsy results.

## 2016-08-23 NOTE — Progress Notes (Signed)
Hematology/Oncology Consult note [facility]  Telephone:([phone] Fax:([phone]) [phone]  Patient Care Team: Ezequiel Kayser, MD as PCP - General (Internal Medicine) Hollice Espy, MD as Consulting Physician (Urology)   Name of the patient: Christian Kelly  [id number]  05-30-1940   Date of visit: 08/23/16 Diagnosis- 1. 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 Atleast Stage II DLBCL GCB. Not double hit. [place] studies pending  Chief complaint/ Reason for visit- discuss pet ct results  Heme/Onc history: 1. Patient is a 76 yr old male with a h/o prostate cancer diagnosed in 1999s/p radical prostatectomy. Prior to that he had colon cancer in 1998 s/o surgery and adjuvant chemotherapy in 1998. With regards to prostate cancer- surgery was done at [facility] in [place] and he was followed at [place] subsequently. Patient think his Gleasons score was 7 at diagnosis. He has not required any radiation therapy or ADT so far. Patient still spends 4-5 months at [place].   2. Dr. Raechel Ache has been monitoring his PSArecently and his recent trend of PSA has been as follows: psa was 0.33 in feb 2017 and 0.63 in April 2018. We have 1 psa from 2015 when it was 0.3  3. Patient was referred to Dr. Erlene Quan urology and Dr. Baruch Gouty for salvage radiation.   4. Dr. Erlene Quan ordered CT abdomen which showed: IMPRESSION: 1. Status post prostatectomy, without locally recurrent disease. 2. Extensive abdominal adenopathy. Given the clinical history, most likely related to metastatic prostate carcinoma. Lymphoma could look similar. 3. Coronary artery atherosclerosis. Aortic atherosclerosis. 4. Vague upper sacral sclerosis is felt unlikely to represent metastatic disease, given lack of correlate on yesterday's bone scan. Correlate with radiation therapy, as radiation induced necrosis could have this appearance. Recommend attention on follow-up.  5.  This was followed by PET/CT scan which showed: IMPRESSION: 1. Hypermetabolic gastrohepatic ligament, abdominal retroperitoneal and small bowel mesenteric adenopathy, most indicative of lymphoma. 2. Aortic atherosclerosis (ICD10-170.0). Coronary artery Calcification.  6. Patient underwent CT-guided biopsy of the mesenteric lymph node which showed:DIAGNOSIS:  A. LYMPH NODE, MESENTERIC; CT-GUIDED CORE BIOPSY:  - LARGE B-CELL LYMPHOMA, CD10 POSITIVE.   Comment:  There is a diffuse proliferation of large lymphocytes with irregular  nuclear contours, inconspicuous nucleoli, and scant cytoplasm. The flow  cytometry and IHC results are most consistent with diffuse large B-cell  lymphoma, not otherwise specified, germinal center B-cell type. However,  it is likely that [place] for MYC, BCL2 and BCL6 gene rearrangements will  be ordered, so final classification will be based on the [facility] result   Interval history- he is doing well. Denies any complaints of pain, fatigue or weight loss  ECOG PS- 0 Pain scale- 0 Opioid associated constipation- no  Review of systems- Review of Systems  Constitutional: Negative for chills, fever, malaise/fatigue and weight loss.  HENT: Negative for congestion, ear discharge and nosebleeds.   Eyes: Negative for blurred vision.  Respiratory: Negative for cough, hemoptysis, sputum production, shortness of breath and wheezing.   Cardiovascular: Negative for chest pain, palpitations, orthopnea and claudication.  Gastrointestinal: Negative for abdominal pain, blood in stool, constipation, diarrhea, heartburn, melena, nausea and vomiting.  Genitourinary: Negative for dysuria, flank pain, frequency, hematuria and urgency.  Musculoskeletal: Negative for back pain, joint pain and myalgias.  Skin: Negative for rash.  Neurological: Negative for dizziness, tingling, focal weakness, seizures, weakness and headaches.  Endo/Heme/Allergies: Does not bruise/bleed easily.    Psychiatric/Behavioral: Negative for depression and suicidal ideas. The patient does not have  insomnia.        No Known Allergies   Past Medical History:  Diagnosis Date  . Arthritis   . Basal cell carcinoma 2008   forehead  . Cancer ([place])   . Chronic renal insufficiency, stage III (moderate)   . Colon cancer ([place]) 1998  . Diabetes mellitus without complication ([place])   . History of kidney stones   . Hyperlipemia   . Hypertension   . Lyme disease   . Prostate cancer ([place]) 1999     Past Surgical History:  Procedure Laterality Date  . COLON SURGERY  1998  . COLONOSCOPY    . COLONOSCOPY WITH PROPOFOL N/A 12/29/2014   Procedure: COLONOSCOPY WITH PROPOFOL;  Surgeon: Lollie Sails, MD;  Location: [facility] ENDOSCOPY;  Service: Endoscopy;  Laterality: N/A;  . EYE SURGERY    . FRACTURE SURGERY    . LITHOTRIPSY    . PROSTATECTOMY  1998  . TONSILLECTOMY      Social History   Social History  . Marital status: Married    Spouse name: N/A  . Number of children: N/A  . Years of education: N/A   Occupational History  . Not on file.   Social History Main Topics  . Smoking status: Former Smoker    Packs/day: 1.00    Years: 8.00    Types: Cigarettes    Quit date: 08/13/1974  . Smokeless tobacco: Former Systems developer    Types: Snuff  . Alcohol use Yes     Comment: rarely-one or two beer  . Drug use: No  . Sexual activity: Not Currently   Other Topics Concern  . Not on file   Social History Narrative  . No narrative on file    Family History  Problem Relation Age of Onset  . Coronary artery disease Mother   . Diabetes Mother   . Prostate cancer Father   . Pancreatitis Father   . Bladder Cancer Sister   . Diabetes Brother   . Diabetes Brother   . Prostate cancer Brother   . Diabetes Brother   . Diabetes Maternal Grandmother      Current Outpatient Prescriptions:  .  aspirin EC 81 MG tablet, Take 81 mg by mouth every other day. , Disp: , Rfl:  .  atorvastatin  (LIPITOR) 40 MG tablet, Take 40 mg by mouth daily., Disp: , Rfl:  .  hydrochlorothiazide (HYDRODIURIL) 25 MG tablet, Take 25 mg by mouth daily., Disp: , Rfl:  .  lisinopril (PRINIVIL,ZESTRIL) 10 MG tablet, Take 10 mg by mouth daily., Disp: , Rfl:  .  Multiple Vitamin (MULTIVITAMIN) capsule, Take 1 capsule by mouth daily., Disp: , Rfl:  .  Probiotic Product (PROBIOTIC PO), Take 1 capsule by mouth., Disp: , Rfl:   Physical exam:  Vitals:   08/23/16 1025  BP: 128/81  Pulse: 68  Resp: 18  Temp: (!) 96.1 F (35.6 C)  TempSrc: Tympanic  Weight: 197 lb 8 oz (89.6 kg)   Physical Exam  Constitutional: He is oriented to person, place, and time and well-developed, well-nourished, and in no distress.  HENT:  Head: Normocephalic and atraumatic.  Eyes: EOM are normal. Pupils are equal, round, and reactive to light.  Neck: Normal range of motion.  Cardiovascular: Normal rate, regular rhythm and normal heart sounds.   Pulmonary/Chest: Effort normal and breath sounds normal.  Abdominal: Soft. Bowel sounds are normal.  Neurological: He is alert and oriented to person, place, and time.  Skin: Skin is warm and  dry.     CMP Latest Ref Rng & Units 08/23/2016  Glucose 65 - 99 mg/dL 175(H)  BUN 6 - 20 mg/dL 27(H)  Creatinine 0.61 - 1.24 mg/dL 1.37(H)  Sodium 135 - 145 mmol/L 136  Potassium 3.5 - 5.1 mmol/L 3.8  Chloride 101 - 111 mmol/L 99(L)  CO2 22 - 32 mmol/L 29  Calcium 8.9 - 10.3 mg/dL 9.4  Total Protein 6.5 - 8.1 g/dL 7.6  Total Bilirubin 0.3 - 1.2 mg/dL 0.8  Alkaline Phos 38 - 126 U/L 89  AST 15 - 41 U/L 23  ALT 17 - 63 U/L 23   CBC Latest Ref Rng & Units 08/12/2016  WBC 3.8 - 10.6 K/uL 8.1  Hemoglobin 13.0 - 18.0 g/dL 14.8  Hematocrit 40.0 - 52.0 % 43.0  Platelets 150 - 440 K/uL 215    No images are attached to the encounter.  Nm Pet Image Initial (pi) Skull Base To Thigh  Result Date: 08/03/2016 CLINICAL DATA:  Initial treatment strategy for mesenteric adenopathy. EXAM: NUCLEAR  MEDICINE PET SKULL BASE TO THIGH TECHNIQUE: 12.1 mCi F-18 FDG was injected intravenously. Full-ring PET imaging was performed from the skull base to thigh after the radiotracer. CT data was obtained and used for attenuation correction and anatomic localization. FASTING BLOOD GLUCOSE:  Value: 139 mg/dl COMPARISON:  CT abdomen pelvis 07/20/2016 and bone scan 07/19/2016. FINDINGS: NECK No hypermetabolic lymph nodes in the neck. CT images show no acute findings. CHEST Not hypermetabolic mediastinal, hilar or axillary lymph nodes. No hypermetabolic pulmonary nodules. Atherosclerotic calcification of the aorta and coronary arteries. Heart is at the upper limits of normal in size. No pericardial or pleural effusion. ABDOMEN/PELVIS Gastrohepatic ligament lymph node measures 1.7 cm with an SUV max of 10.0. There are hypermetabolic retroperitoneal lymph nodes. Index retrocaval lymph node measures 10 mm (CT image 188) with an SUV max of 4.1. Matted mesenteric adenopathy measures up to 3.7 x 6.5 cm (CT image 202) with an SUV max of 11.5. No abnormal hypermetabolism in the liver, adrenal glands, spleen or pancreas. Liver, gallbladder and adrenal glands are unremarkable. Low-attenuation lesions in the kidneys measure up to 1.8 cm on the left, difficult to further characterize without post-contrast imaging. Spleen, pancreas, stomach and bowel are grossly unremarkable. Atherosclerotic calcification of the aorta. Prostatectomy. SKELETON No abnormal osseous hypermetabolism. IMPRESSION: 1. Hypermetabolic gastrohepatic ligament, abdominal retroperitoneal and small bowel mesenteric adenopathy, most indicative of lymphoma. 2. Aortic atherosclerosis (ICD10-170.0). Coronary artery calcification. Electronically Signed   By: Lorin Picket M.D.   On: 08/03/2016 11:41   Ct Biopsy  Result Date: 08/12/2016 INDICATION: Mesenteric adenopathy EXAM: CT BIOPSY MEDICATIONS: None. ANESTHESIA/SEDATION: Fentanyl 50 mcg IV; Versed 2 mg IV Moderate  Sedation Time:  27 The patient was continuously monitored during the procedure by the interventional radiology nurse under my direct supervision. FLUOROSCOPY TIME:  None COMPLICATIONS: None immediate. PROCEDURE: Informed written consent was obtained from the patient after a thorough discussion of the procedural risks, benefits and alternatives. All questions were addressed. Maximal Sterile Barrier Technique was utilized including caps, mask, sterile gowns, sterile gloves, sterile drape, hand hygiene and skin antiseptic. A timeout was performed prior to the initiation of the procedure. Under CT guidance, a(n) 17 gauge guide needle was advanced into the mesenteric lymph node. Subsequently 3 18 gauge core biopsies were obtained. Gel-Foam slurry was injected. The guide needle was removed. Post biopsy images demonstrate no hemorrhage. Patient tolerated the procedure well without complication. Vital sign monitoring by nursing staff during the procedure  will continue as patient is in the special procedures unit for post procedure observation. FINDINGS: The images document guide needle placement within the mesenteric lymph node. Post biopsy images demonstrate no hemorrhage. IMPRESSION: Successful CT-guided mesenteric lymph node core biopsy. Electronically Signed   By: Marybelle Killings M.D.   On: 08/12/2016 11:25     Assessment and plan- Patient is a 76 y.o. male who sees me for following issues:  1. Atleast Stage II DLBCL- I discussed the results of the PET CT scan and pathology with the patient in detail. Mesenteric lymph node biopsies consistent with diffuse large B-cell lymphoma which is an aggressive type of B-cell lymphoma that would require treatment and cannot be observed even if this was incidentally found on CT abdomen. He has only evidence of intra-abdominal adenopathy in 3 different lymph node regions which would classify him in stage II disease pending bone marrow biopsy. I discussed the risks and benefits of  bone marrow biopsy which would be required to complete his  staging workup and he agrees to proceed with that as planned.   Discussed risks and benefits of chemotherapy with R CHOP given every 3 weeks with Neulasta support. I would favor to give him 6 cycles of chemotherapy instead of 3 cycles of chemotherapy with radiation given the scattered distribution of intra-abdominal adenopathy and difficult to give IFRT to these sites. I discussed the risk and benefits of our CHOP including all but not limited to nausea, vomiting, fatigue, risk of low blood counts and infections and hair loss. Risk of cardiac toxicity associated with anthracycline, peripheral neuropathy associated with with vincristine. Patient understands and agrees to proceed. Chemotherapy will be given with curative intent  I will plan to get a port placement and bone marrow biopsy hopefully at the same time with interventional radiology. I will obtain baseline MUGA scan prior to starting anthracycline. I will check CMP, LDH, HIV and hepatitis B and hepatitis C serology prior to starting Rituxan.  I will tentatively plan to start treatment in 2 weeks' time. I will also send out [place] testing for C myc, BCL2 and BCL6 to confirm that he does not have a double hit lymphoma  2. Biochemical recurrence of prostate cancer- salvage radiation will currently be on hold until he completes therapy for his diffuse large B-cell lymphoma  3. Given that he has had 3 different cancers in his lifetime and extensive family history of cancer, I will refer him to [facility] genetic counseling down the line   Total face to face encounter time for this patient visit was 45 min. >50% of the time was  spent in counseling and coordination of care.     Visit Diagnosis 1. Prostate cancer ([place])   2. History of colon cancer   3. Diffuse large B-cell lymphoma of intra-abdominal lymph nodes (HCC)      Dr. Randa Evens, MD, MPH [place] [facility] Pager- [phone] 08/23/2016 1:13 PM

## 2016-08-24 LAB — HEPATITIS B SURFACE ANTIBODY, QUANTITATIVE: HEPATITIS B-POST: 793.3 m[IU]/mL

## 2016-08-24 LAB — HEPATITIS C ANTIBODY: HCV Ab: <0.1 s/co ratio (ref 0.0–0.9)

## 2016-08-24 LAB — HEPATITIS B SURFACE ANTIGEN: HEP B S AG: NEGATIVE

## 2016-08-24 LAB — HIV ANTIBODY (ROUTINE TESTING W REFLEX): HIV Screen 4th Generation wRfx: NONREACTIVE

## 2016-08-24 LAB — HEPATITIS B CORE ANTIBODY, IGM: HEP B C IGM: NEGATIVE

## 2016-08-25 ENCOUNTER — Encounter: Payer: Self-pay | Admitting: Interventional Radiology

## 2016-08-25 ENCOUNTER — Inpatient Hospital Stay: Payer: Medicare HMO

## 2016-08-30 ENCOUNTER — Telehealth: Payer: Self-pay | Admitting: *Deleted

## 2016-08-30 ENCOUNTER — Ambulatory Visit
Admission: RE | Admit: 2016-08-30 | Discharge: 2016-08-30 | Disposition: A | Discharge status: Home / Self Care | Payer: Medicare HMO | Source: Ambulatory Visit | Attending: Oncology | Admitting: Oncology

## 2016-08-30 DIAGNOSIS — C8333 Diffuse large B-cell lymphoma, intra-abdominal lymph nodes: Secondary | ICD-10-CM | POA: Diagnosis not present

## 2016-08-30 DIAGNOSIS — Z85038 Personal history of other malignant neoplasm of large intestine: Secondary | ICD-10-CM | POA: Diagnosis not present

## 2016-08-30 DIAGNOSIS — C189 Malignant neoplasm of colon, unspecified: Secondary | ICD-10-CM | POA: Diagnosis not present

## 2016-08-30 DIAGNOSIS — C61 Malignant neoplasm of prostate: Secondary | ICD-10-CM | POA: Insufficient documentation

## 2016-08-30 MED ORDER — TECHNETIUM TC 99M-LABELED RED BLOOD CELLS IV KIT
20.0000 | PACK | Freq: Once | INTRAVENOUS | Status: AC | PRN
  Administered 2016-08-30: 21.95 via INTRAVENOUS

## 2016-08-30 NOTE — Patient Instructions (Signed)
Rituximab injection What is this medicine? RITUXIMAB (ri TUX i mab) is a monoclonal antibody. It is used to treat certain types of cancer like non-Hodgkin lymphoma and chronic lymphocytic leukemia. It is also used to treat rheumatoid arthritis, granulomatosis with polyangiitis (or Wegener's granulomatosis), and microscopic polyangiitis. This medicine may be used for other purposes; ask your health care provider or pharmacist if you have questions. COMMON BRAND NAME(S): Rituxan What should I tell my health care provider before I take this medicine? They need to know if you have any of these conditions: -heart disease -infection (especially a virus infection such as hepatitis B, chickenpox, cold sores, or herpes) -immune system problems -irregular heartbeat -kidney disease -lung or breathing disease, like asthma -recently received or scheduled to receive a vaccine -an unusual or allergic reaction to rituximab, mouse proteins, other medicines, foods, dyes, or preservatives -pregnant or trying to get pregnant -breast-feeding How should I use this medicine? This medicine is for infusion into a vein. It is administered in a hospital or clinic by a specially trained health care professional. A special MedGuide will be given to you by the pharmacist with each prescription and refill. Be sure to read this information carefully each time. Talk to your pediatrician regarding the use of this medicine in children. This medicine is not approved for use in children. Overdosage: If you think you have taken too much of this medicine contact a poison control center or emergency room at once. NOTE: This medicine is only for you. Do not share this medicine with others. What if I miss a dose? It is important not to miss a dose. Call your doctor or health care professional if you are unable to keep an appointment. What may interact with this medicine? -cisplatin -other medicines for arthritis like disease  modifying antirheumatic drugs or tumor necrosis factor inhibitors -live virus vaccines This list may not describe all possible interactions. Give your health care provider a list of all the medicines, herbs, non-prescription drugs, or dietary supplements you use. Also tell them if you smoke, drink alcohol, or use illegal drugs. Some items may interact with your medicine. What should I watch for while using this medicine? Your condition will be monitored carefully while you are receiving this medicine. You may need blood work done while you are taking this medicine. This medicine can cause serious allergic reactions. To reduce your risk you may need to take medicine before treatment with this medicine. Take your medicine as directed. In some patients, this medicine may cause a serious brain infection that may cause death. If you have any problems seeing, thinking, speaking, walking, or standing, tell your doctor right away. If you cannot reach your doctor, urgently seek other source of medical care. Call your doctor or health care professional for advice if you get a fever, chills or sore throat, or other symptoms of a cold or flu. Do not treat yourself. This drug decreases your body's ability to fight infections. Try to avoid being around people who are sick. Do not become pregnant while taking this medicine or for 12 months after stopping it. Women should inform their doctor if they wish to become pregnant or think they might be pregnant. There is a potential for serious side effects to an unborn child. Talk to your health care professional or pharmacist for more information. What side effects may I notice from receiving this medicine? Side effects that you should report to your doctor or health care professional as soon as possible: -breathing   problems -chest pain -dizziness or feeling faint -fast, irregular heartbeat -low blood counts - this medicine may decrease the number of white blood cells,  red blood cells and platelets. You may be at increased risk for infections and bleeding. -mouth sores -redness, blistering, peeling or loosening of the skin, including inside the mouth (this can be added for any serious or exfoliative rash that could lead to hospitalization) -signs of infection - fever or chills, cough, sore throat, pain or difficulty passing urine -signs and symptoms of kidney injury like trouble passing urine or change in the amount of urine -signs and symptoms of liver injury like dark yellow or brown urine; general ill feeling or flu-like symptoms; light-colored stools; loss of appetite; nausea; right upper belly pain; unusually weak or tired; yellowing of the eyes or skin -stomach pain -vomiting Side effects that usually do not require medical attention (report to your doctor or health care professional if they continue or are bothersome): -headache -joint pain -muscle cramps or muscle pain This list may not describe all possible side effects. Call your doctor for medical advice about side effects. You may report side effects to FDA at 1-800-FDA-1088. Where should I keep my medicine? This drug is given in a hospital or clinic and will not be stored at home. NOTE: This sheet is a summary. It may not cover all possible information. If you have questions about this medicine, talk to your doctor, pharmacist, or health care provider.  2018 Elsevier/Gold Standard (2015-09-16 15:28:09) Doxorubicin injection What is this medicine? DOXORUBICIN (dox oh ROO bi sin) is a chemotherapy drug. It is used to treat many kinds of cancer like leukemia, lymphoma, neuroblastoma, sarcoma, and Wilms' tumor. It is also used to treat bladder cancer, breast cancer, lung cancer, ovarian cancer, stomach cancer, and thyroid cancer. This medicine may be used for other purposes; ask your health care provider or pharmacist if you have questions. COMMON BRAND NAME(S): Adriamycin, Adriamycin PFS, Adriamycin  RDF, Rubex What should I tell my health care provider before I take this medicine? They need to know if you have any of these conditions: -heart disease -history of low blood counts caused by a medicine -liver disease -recent or ongoing radiation therapy -an unusual or allergic reaction to doxorubicin, other chemotherapy agents, other medicines, foods, dyes, or preservatives -pregnant or trying to get pregnant -breast-feeding How should I use this medicine? This drug is given as an infusion into a vein. It is administered in a hospital or clinic by a specially trained health care professional. If you have pain, swelling, burning or any unusual feeling around the site of your injection, tell your health care professional right away. Talk to your pediatrician regarding the use of this medicine in children. Special care may be needed. Overdosage: If you think you have taken too much of this medicine contact a poison control center or emergency room at once. NOTE: This medicine is only for you. Do not share this medicine with others. What if I miss a dose? It is important not to miss your dose. Call your doctor or health care professional if you are unable to keep an appointment. What may interact with this medicine? This medicine may interact with the following medications: -6-mercaptopurine -paclitaxel -phenytoin -St. John's Wort -trastuzumab -verapamil This list may not describe all possible interactions. Give your health care provider a list of all the medicines, herbs, non-prescription drugs, or dietary supplements you use. Also tell them if you smoke, drink alcohol, or use illegal drugs.   Some items may interact with your medicine. What should I watch for while using this medicine? This drug may make you feel generally unwell. This is not uncommon, as chemotherapy can affect healthy cells as well as cancer cells. Report any side effects. Continue your course of treatment even though you  feel ill unless your doctor tells you to stop. There is a maximum amount of this medicine you should receive throughout your life. The amount depends on the medical condition being treated and your overall health. Your doctor will watch how much of this medicine you receive in your lifetime. Tell your doctor if you have taken this medicine before. You may need blood work done while you are taking this medicine. Your urine may turn red for a few days after your dose. This is not blood. If your urine is dark or brown, call your doctor. In some cases, you may be given additional medicines to help with side effects. Follow all directions for their use. Call your doctor or health care professional for advice if you get a fever, chills or sore throat, or other symptoms of a cold or flu. Do not treat yourself. This drug decreases your body's ability to fight infections. Try to avoid being around people who are sick. This medicine may increase your risk to bruise or bleed. Call your doctor or health care professional if you notice any unusual bleeding. Talk to your doctor about your risk of cancer. You may be more at risk for certain types of cancers if you take this medicine. Do not become pregnant while taking this medicine or for 6 months after stopping it. Women should inform their doctor if they wish to become pregnant or think they might be pregnant. Men should not father a child while taking this medicine and for 6 months after stopping it. There is a potential for serious side effects to an unborn child. Talk to your health care professional or pharmacist for more information. Do not breast-feed an infant while taking this medicine. This medicine has caused ovarian failure in some women and reduced sperm counts in some men This medicine may interfere with the ability to have a child. Talk with your doctor or health care professional if you are concerned about your fertility. What side effects may I notice  from receiving this medicine? Side effects that you should report to your doctor or health care professional as soon as possible: -allergic reactions like skin rash, itching or hives, swelling of the face, lips, or tongue -breathing problems -chest pain -fast or irregular heartbeat -low blood counts - this medicine may decrease the number of white blood cells, red blood cells and platelets. You may be at increased risk for infections and bleeding. -pain, redness, or irritation at site where injected -signs of infection - fever or chills, cough, sore throat, pain or difficulty passing urine -signs of decreased platelets or bleeding - bruising, pinpoint red spots on the skin, black, tarry stools, blood in the urine -swelling of the ankles, feet, hands -tiredness -weakness Side effects that usually do not require medical attention (report to your doctor or health care professional if they continue or are bothersome): -diarrhea -hair loss -mouth sores -nail discoloration or damage -nausea -red colored urine -vomiting This list may not describe all possible side effects. Call your doctor for medical advice about side effects. You may report side effects to FDA at 1-800-FDA-1088. Where should I keep my medicine? This drug is given in a hospital or clinic   and will not be stored at home. NOTE: This sheet is a summary. It may not cover all possible information. If you have questions about this medicine, talk to your doctor, pharmacist, or health care provider.  2018 Elsevier/Gold Standard (2015-04-06 11:28:51) Vincristine injection What is this medicine? VINCRISTINE (vin KRIS teen) is a chemotherapy drug. It slows the growth of cancer cells. This medicine is used to treat many types of cancer like Hodgkin's disease, leukemia, non-Hodgkin's lymphoma, neuroblastoma (brain cancer), rhabdomyosarcoma, and Wilms' tumor. This medicine may be used for other purposes; ask your health care provider or  pharmacist if you have questions. COMMON BRAND NAME(S): Oncovin, Vincasar PFS What should I tell my health care provider before I take this medicine? They need to know if you have any of these conditions: -blood disorders -gout -infection (especially chickenpox, cold sores, or herpes) -kidney disease -liver disease -lung disease -nervous system disease like Charcot-Marie-Tooth (CMT) -recent or ongoing radiation therapy -an unusual or allergic reaction to vincristine, other chemotherapy agents, other medicines, foods, dyes, or preservatives -pregnant or trying to get pregnant -breast-feeding How should I use this medicine? This drug is given as an infusion into a vein. It is administered in a hospital or clinic by a specially trained health care professional. If you have pain, swelling, burning, or any unusual feeling around the site of your injection, tell your health care professional right away. Talk to your pediatrician regarding the use of this medicine in children. While this drug may be prescribed for selected conditions, precautions do apply. Overdosage: If you think you have taken too much of this medicine contact a poison control center or emergency room at once. NOTE: This medicine is only for you. Do not share this medicine with others. What if I miss a dose? It is important not to miss your dose. Call your doctor or health care professional if you are unable to keep an appointment. What may interact with this medicine? Do not take this medicine with any of the following medications: -itraconazole -mibefradil -voriconazole This medicine may also interact with the following medications: -cyclosporine -erythromycin -fluconazole -ketoconazole -medicines for HIV like delavirdine, efavirenz, nevirapine -medicines for seizures like ethotoin, fosphenotoin, phenytoin -medicines to increase blood counts like filgrastim, pegfilgrastim, sargramostim -other chemotherapy drugs like  cisplatin, L-asparaginase, methotrexate, mitomycin, paclitaxel -pegaspargase -vaccines -zalcitabine, ddC Talk to your doctor or health care professional before taking any of these medicines: -acetaminophen -aspirin -ibuprofen -ketoprofen -naproxen This list may not describe all possible interactions. Give your health care provider a list of all the medicines, herbs, non-prescription drugs, or dietary supplements you use. Also tell them if you smoke, drink alcohol, or use illegal drugs. Some items may interact with your medicine. What should I watch for while using this medicine? Your condition will be monitored carefully while you are receiving this medicine. You will need important blood work done while you are taking this medicine. This drug may make you feel generally unwell. This is not uncommon, as chemotherapy can affect healthy cells as well as cancer cells. Report any side effects. Continue your course of treatment even though you feel ill unless your doctor tells you to stop. In some cases, you may be given additional medicines to help with side effects. Follow all directions for their use. Call your doctor or health care professional for advice if you get a fever, chills or sore throat, or other symptoms of a cold or flu. Do not treat yourself. Avoid taking products that contain aspirin, acetaminophen, ibuprofen,   naproxen, or ketoprofen unless instructed by your doctor. These medicines may hide a fever. Do not become pregnant while taking this medicine. Women should inform their doctor if they wish to become pregnant or think they might be pregnant. There is a potential for serious side effects to an unborn child. Talk to your health care professional or pharmacist for more information. Do not breast-feed an infant while taking this medicine. Men may have a lower sperm count while taking this medicine. Talk to your doctor if you plan to father a child. What side effects may I notice from  receiving this medicine? Side effects that you should report to your doctor or health care professional as soon as possible: -allergic reactions like skin rash, itching or hives, swelling of the face, lips, or tongue -breathing problems -confusion or changes in emotions or moods -constipation -cough -mouth sores -muscle weakness -nausea and vomiting -pain, swelling, redness or irritation at the injection site -pain, tingling, numbness in the hands or feet -problems with balance, talking, walking -seizures -stomach pain -trouble passing urine or change in the amount of urine Side effects that usually do not require medical attention (report to your doctor or health care professional if they continue or are bothersome): -diarrhea -hair loss -jaw pain -loss of appetite This list may not describe all possible side effects. Call your doctor for medical advice about side effects. You may report side effects to FDA at 1-800-FDA-1088. Where should I keep my medicine? This drug is given in a hospital or clinic and will not be stored at home. NOTE: This sheet is a summary. It may not cover all possible information. If you have questions about this medicine, talk to your doctor, pharmacist, or health care provider.  2018 Elsevier/Gold Standard (2007-11-05 17:17:13) Cyclophosphamide injection What is this medicine? CYCLOPHOSPHAMIDE (sye kloe FOSS fa mide) is a chemotherapy drug. It slows the growth of cancer cells. This medicine is used to treat many types of cancer like lymphoma, myeloma, leukemia, breast cancer, and ovarian cancer, to name a few. This medicine may be used for other purposes; ask your health care provider or pharmacist if you have questions. COMMON BRAND NAME(S): Cytoxan, Neosar What should I tell my health care provider before I take this medicine? They need to know if you have any of these conditions: -blood disorders -history of other chemotherapy -infection -kidney  disease -liver disease -recent or ongoing radiation therapy -tumors in the bone marrow -an unusual or allergic reaction to cyclophosphamide, other chemotherapy, other medicines, foods, dyes, or preservatives -pregnant or trying to get pregnant -breast-feeding How should I use this medicine? This drug is usually given as an injection into a vein or muscle or by infusion into a vein. It is administered in a hospital or clinic by a specially trained health care professional. Talk to your pediatrician regarding the use of this medicine in children. Special care may be needed. Overdosage: If you think you have taken too much of this medicine contact a poison control center or emergency room at once. NOTE: This medicine is only for you. Do not share this medicine with others. What if I miss a dose? It is important not to miss your dose. Call your doctor or health care professional if you are unable to keep an appointment. What may interact with this medicine? This medicine may interact with the following medications: -amiodarone -amphotericin B -azathioprine -certain antiviral medicines for HIV or AIDS such as protease inhibitors (e.g., indinavir, ritonavir) and zidovudine -certain blood   pressure medications such as benazepril, captopril, enalapril, fosinopril, lisinopril, moexipril, monopril, perindopril, quinapril, ramipril, trandolapril -certain cancer medications such as anthracyclines (e.g., daunorubicin, doxorubicin), busulfan, cytarabine, paclitaxel, pentostatin, tamoxifen, trastuzumab -certain diuretics such as chlorothiazide, chlorthalidone, hydrochlorothiazide, indapamide, metolazone -certain medicines that treat or prevent blood clots like warfarin -certain muscle relaxants such as succinylcholine -cyclosporine -etanercept -indomethacin -medicines to increase blood counts like filgrastim, pegfilgrastim, sargramostim -medicines used as general  anesthesia -metronidazole -natalizumab This list may not describe all possible interactions. Give your health care provider a list of all the medicines, herbs, non-prescription drugs, or dietary supplements you use. Also tell them if you smoke, drink alcohol, or use illegal drugs. Some items may interact with your medicine. What should I watch for while using this medicine? Visit your doctor for checks on your progress. This drug may make you feel generally unwell. This is not uncommon, as chemotherapy can affect healthy cells as well as cancer cells. Report any side effects. Continue your course of treatment even though you feel ill unless your doctor tells you to stop. Drink water or other fluids as directed. Urinate often, even at night. In some cases, you may be given additional medicines to help with side effects. Follow all directions for their use. Call your doctor or health care professional for advice if you get a fever, chills or sore throat, or other symptoms of a cold or flu. Do not treat yourself. This drug decreases your body's ability to fight infections. Try to avoid being around people who are sick. This medicine may increase your risk to bruise or bleed. Call your doctor or health care professional if you notice any unusual bleeding. Be careful brushing and flossing your teeth or using a toothpick because you may get an infection or bleed more easily. If you have any dental work done, tell your dentist you are receiving this medicine. You may get drowsy or dizzy. Do not drive, use machinery, or do anything that needs mental alertness until you know how this medicine affects you. Do not become pregnant while taking this medicine or for 1 year after stopping it. Women should inform their doctor if they wish to become pregnant or think they might be pregnant. Men should not father a child while taking this medicine and for 4 months after stopping it. There is a potential for serious side  effects to an unborn child. Talk to your health care professional or pharmacist for more information. Do not breast-feed an infant while taking this medicine. This medicine may interfere with the ability to have a child. This medicine has caused ovarian failure in some women. This medicine has caused reduced sperm counts in some men. You should talk with your doctor or health care professional if you are concerned about your fertility. If you are going to have surgery, tell your doctor or health care professional that you have taken this medicine. What side effects may I notice from receiving this medicine? Side effects that you should report to your doctor or health care professional as soon as possible: -allergic reactions like skin rash, itching or hives, swelling of the face, lips, or tongue -low blood counts - this medicine may decrease the number of white blood cells, red blood cells and platelets. You may be at increased risk for infections and bleeding. -signs of infection - fever or chills, cough, sore throat, pain or difficulty passing urine -signs of decreased platelets or bleeding - bruising, pinpoint red spots on the skin, black, tarry stools, blood   in the urine -signs of decreased red blood cells - unusually weak or tired, fainting spells, lightheadedness -breathing problems -dark urine -dizziness -palpitations -swelling of the ankles, feet, hands -trouble passing urine or change in the amount of urine -weight gain -yellowing of the eyes or skin Side effects that usually do not require medical attention (report to your doctor or health care professional if they continue or are bothersome): -changes in nail or skin color -hair loss -missed menstrual periods -mouth sores -nausea, vomiting This list may not describe all possible side effects. Call your doctor for medical advice about side effects. You may report side effects to FDA at 1-800-FDA-1088. Where should I keep my  medicine? This drug is given in a hospital or clinic and will not be stored at home. NOTE: This sheet is a summary. It may not cover all possible information. If you have questions about this medicine, talk to your doctor, pharmacist, or health care provider.  2018 [employer]/Gold Standard (2011-12-23 16:22:58)  

## 2016-08-30 NOTE — Telephone Encounter (Signed)
Called patient to notify him that per Dr. Janese Banks his Hep B results were negative.

## 2016-08-31 ENCOUNTER — Other Ambulatory Visit: Payer: Self-pay | Admitting: Radiology

## 2016-08-31 ENCOUNTER — Inpatient Hospital Stay: Payer: Medicare HMO

## 2016-09-01 ENCOUNTER — Ambulatory Visit: Payer: Medicare HMO

## 2016-09-01 ENCOUNTER — Other Ambulatory Visit (HOSPITAL_COMMUNITY)
Admission: RE | Admit: 2016-09-01 | Discharge: 2016-09-01 | Disposition: A | Discharge status: Home / Self Care | Payer: Medicare HMO | Source: Ambulatory Visit | Attending: Oncology | Admitting: Oncology

## 2016-09-01 ENCOUNTER — Ambulatory Visit
Admission: RE | Admit: 2016-09-01 | Discharge: 2016-09-01 | Disposition: A | Discharge status: Home / Self Care | Payer: Medicare HMO | Source: Ambulatory Visit | Attending: Oncology | Admitting: Oncology

## 2016-09-01 DIAGNOSIS — Z87891 Personal history of nicotine dependence: Secondary | ICD-10-CM | POA: Insufficient documentation

## 2016-09-01 DIAGNOSIS — C61 Malignant neoplasm of prostate: Secondary | ICD-10-CM

## 2016-09-01 DIAGNOSIS — Z7982 Long term (current) use of aspirin: Secondary | ICD-10-CM | POA: Diagnosis not present

## 2016-09-01 DIAGNOSIS — Z5111 Encounter for antineoplastic chemotherapy: Secondary | ICD-10-CM | POA: Diagnosis not present

## 2016-09-01 DIAGNOSIS — M1990 Unspecified osteoarthritis, unspecified site: Secondary | ICD-10-CM | POA: Insufficient documentation

## 2016-09-01 DIAGNOSIS — Z7952 Long term (current) use of systemic steroids: Secondary | ICD-10-CM | POA: Diagnosis not present

## 2016-09-01 DIAGNOSIS — C8333 Diffuse large B-cell lymphoma, intra-abdominal lymph nodes: Secondary | ICD-10-CM

## 2016-09-01 DIAGNOSIS — E785 Hyperlipidemia, unspecified: Secondary | ICD-10-CM | POA: Diagnosis not present

## 2016-09-01 DIAGNOSIS — Z8546 Personal history of malignant neoplasm of prostate: Secondary | ICD-10-CM | POA: Insufficient documentation

## 2016-09-01 DIAGNOSIS — Z8572 Personal history of non-Hodgkin lymphomas: Secondary | ICD-10-CM | POA: Diagnosis not present

## 2016-09-01 DIAGNOSIS — Z85038 Personal history of other malignant neoplasm of large intestine: Secondary | ICD-10-CM | POA: Diagnosis not present

## 2016-09-01 DIAGNOSIS — C8590 Non-Hodgkin lymphoma, unspecified, unspecified site: Secondary | ICD-10-CM | POA: Insufficient documentation

## 2016-09-01 DIAGNOSIS — I129 Hypertensive chronic kidney disease with stage 1 through stage 4 chronic kidney disease, or unspecified chronic kidney disease: Secondary | ICD-10-CM | POA: Diagnosis not present

## 2016-09-01 DIAGNOSIS — D7589 Other specified diseases of blood and blood-forming organs: Secondary | ICD-10-CM | POA: Diagnosis not present

## 2016-09-01 DIAGNOSIS — E1122 Type 2 diabetes mellitus with diabetic chronic kidney disease: Secondary | ICD-10-CM | POA: Insufficient documentation

## 2016-09-01 DIAGNOSIS — N183 Chronic kidney disease, stage 3 (moderate): Secondary | ICD-10-CM | POA: Insufficient documentation

## 2016-09-01 HISTORY — PX: IR FLUORO GUIDE PORT INSERTION RIGHT: IMG5741

## 2016-09-01 LAB — BASIC METABOLIC PANEL
Anion gap: 9 (ref 5–15)
BUN: 24 mg/dL — AB (ref 6–20)
CHLORIDE: 103 mmol/L (ref 101–111)
CO2: 27 mmol/L (ref 22–32)
Calcium: 9 mg/dL (ref 8.9–10.3)
Creatinine, Ser: 0.98 mg/dL (ref 0.61–1.24)
GFR calc Af Amer: >60 mL/min (ref >60)
GFR calc non Af Amer: >60 mL/min (ref >60)
Glucose, Bld: 141 mg/dL — ABNORMAL HIGH (ref 65–99)
POTASSIUM: 3.6 mmol/L (ref 3.5–5.1)
SODIUM: 139 mmol/L (ref 135–145)

## 2016-09-01 LAB — CBC WITH DIFFERENTIAL/PLATELET
BASOS ABS: 0.1 10*3/uL (ref 0–0.1)
Basophils Relative: 1 %
Eosinophils Absolute: 0.1 10*3/uL (ref 0–0.7)
Eosinophils Relative: 2 %
HEMATOCRIT: 41.7 % (ref 40.0–52.0)
HEMOGLOBIN: 14.6 g/dL (ref 13.0–18.0)
LYMPHS PCT: 13 %
Lymphs Abs: 1.1 10*3/uL (ref 1.0–3.6)
MCH: 31.6 pg (ref 26.0–34.0)
MCHC: 34.9 g/dL (ref 32.0–36.0)
MCV: 90.5 fL (ref 80.0–100.0)
MONOS PCT: 9 %
Monocytes Absolute: 0.8 10*3/uL (ref 0.2–1.0)
NEUTROS ABS: 6.4 10*3/uL (ref 1.4–6.5)
NEUTROS PCT: 75 %
Platelets: 191 10*3/uL (ref 150–440)
RBC: 4.61 MIL/uL (ref 4.40–5.90)
RDW: 12.6 % (ref 11.5–14.5)
WBC: 8.5 10*3/uL (ref 3.8–10.6)

## 2016-09-01 LAB — PROTIME-INR
INR: 0.98
PROTHROMBIN TIME: 13 s (ref 11.4–15.2)

## 2016-09-01 LAB — APTT: aPTT: 27 seconds (ref 24–36)

## 2016-09-01 MED ORDER — HEPARIN (PORCINE) IN NACL 2-0.9 UNIT/ML-% IJ SOLN
INTRAMUSCULAR | Status: AC
Start: 2016-09-01 — End: 2016-09-01
  Filled 2016-09-01: qty 500

## 2016-09-01 MED ORDER — SODIUM CHLORIDE 0.9 % IV SOLN
INTRAVENOUS | Status: DC
  Administered 2016-09-01: 09:00:00 via INTRAVENOUS

## 2016-09-01 MED ORDER — MIDAZOLAM HCL 5 MG/5ML IJ SOLN
INTRAMUSCULAR | Status: AC
  Filled 2016-09-01: qty 10

## 2016-09-01 MED ORDER — CEFAZOLIN SODIUM-DEXTROSE 2-4 GM/100ML-% IV SOLN
2.0000 g | INTRAVENOUS | Status: DC
  Filled 2016-09-01: qty 100

## 2016-09-01 MED ORDER — FENTANYL CITRATE (PF) 100 MCG/2ML IJ SOLN
INTRAMUSCULAR | Status: DC | PRN
  Administered 2016-09-01: 50 ug via INTRAVENOUS

## 2016-09-01 MED ORDER — HEPARIN SOD (PORK) LOCK FLUSH 100 UNIT/ML IV SOLN
INTRAVENOUS | Status: AC
  Filled 2016-09-01: qty 5

## 2016-09-01 MED ORDER — LIDOCAINE-EPINEPHRINE (PF) 2 %-1:200000 IJ SOLN
INTRAMUSCULAR | Status: AC
  Filled 2016-09-01: qty 20

## 2016-09-01 MED ORDER — CEFAZOLIN SODIUM-DEXTROSE 2-4 GM/100ML-% IV SOLN
INTRAVENOUS | Status: AC
  Administered 2016-09-01: 2 g
  Filled 2016-09-01: qty 100

## 2016-09-01 MED ORDER — MIDAZOLAM HCL 5 MG/5ML IJ SOLN
INTRAMUSCULAR | Status: AC | PRN
  Administered 2016-09-01 (×2): 1 mg via INTRAVENOUS

## 2016-09-01 MED ORDER — FENTANYL CITRATE (PF) 100 MCG/2ML IJ SOLN
INTRAMUSCULAR | Status: AC | PRN
  Administered 2016-09-01: 50 ug via INTRAVENOUS
  Administered 2016-09-01: 25 ug via INTRAVENOUS

## 2016-09-01 MED ORDER — MIDAZOLAM HCL 5 MG/5ML IJ SOLN
INTRAMUSCULAR | Status: DC | PRN
  Administered 2016-09-01: 1 mg via INTRAVENOUS

## 2016-09-01 MED ORDER — FENTANYL CITRATE (PF) 100 MCG/2ML IJ SOLN
INTRAMUSCULAR | Status: AC
  Filled 2016-09-01: qty 4

## 2016-09-01 NOTE — Procedures (Signed)
Pre-procedure Diagnosis: Lymphoma Post-procedure Diagnosis: Same  Technically successful CT guided bone marrow aspiration and biopsy of left iliac crest.   Complications: None Immediate  EBL: None  SignedSandi Mariscal Pager: [phone] 09/01/2016, 9:57 AM

## 2016-09-01 NOTE — Procedures (Signed)
Pre Procedure Dx: Lymphoma Post Procedural Dx: Same  Successful placement of right IJ approach port-a-cath with tip at the superior caval atrial junction. The catheter is ready for immediate use.  Estimated Blood Loss: Minimal  Complications: None immediate.  Jay Nestor Wieneke, MD Pager #: [phone]   

## 2016-09-01 NOTE — Sedation Documentation (Signed)
Dr Pascal Lux in another case, so pt moved back into pre op area

## 2016-09-01 NOTE — Consult Note (Signed)
Chief Complaint: Lymphoma  Referring Physician(s): Rao,Archana C  Patient Status: [facility] - Out-pt  History of Present Illness: Christian Kelly is a 76 y.o. male with past medical history significant for hypertension, hyperlipidemia, chronic renal insufficiency, diabetes, colon and prostate cancer with recent diagnosis of lymphoma who presents today for CT-guided bone marrow biopsy and aspiration as well as portacatheter placement. Patient is accompanied by his wife though serves as his own historian.  Patient had right anterior chest wall portacatheter placed in [place] several years ago. This portacatheter was in place for approximately 1 year.  Patient states that his health is unchanged since last being seen by Dr. Janese Banks. He continues to complain of decreased appetite and fatigue. No fever or chills. No chest pain or shortness of breath. No change in bowel or bladder functions.    Past Medical History:  Diagnosis Date  . Arthritis   . Basal cell carcinoma 2008   forehead  . Cancer ([place])   . Chronic renal insufficiency, stage III (moderate)   . Colon cancer ([place]) 1998  . Diabetes mellitus without complication ([place])   . History of kidney stones   . Hyperlipemia   . Hypertension   . Lyme disease   . Prostate cancer ([place]) 1999    Past Surgical History:  Procedure Laterality Date  . COLON SURGERY  1998  . COLONOSCOPY    . COLONOSCOPY WITH PROPOFOL N/A 12/29/2014   Procedure: COLONOSCOPY WITH PROPOFOL;  Surgeon: Lollie Sails, MD;  Location: [facility] ENDOSCOPY;  Service: Endoscopy;  Laterality: N/A;  . EYE SURGERY    . FRACTURE SURGERY    . LITHOTRIPSY    . PROSTATECTOMY  1998  . TONSILLECTOMY      Allergies: Patient has no known allergies.  Medications: Prior to Admission medications   Medication Sig Start Date End Date Taking? Authorizing Provider  allopurinol (ZYLOPRIM) 300 MG tablet Take 1 tablet (300 mg total) by mouth daily. 08/23/16   Sindy Guadeloupe, MD    aspirin EC 81 MG tablet Take 81 mg by mouth every other day.     [provider]  atorvastatin (LIPITOR) 40 MG tablet Take 40 mg by mouth daily.    [provider]  hydrochlorothiazide (HYDRODIURIL) 25 MG tablet Take 25 mg by mouth daily.    [provider]  lidocaine-prilocaine (EMLA) cream Apply to affected area once 08/23/16   Sindy Guadeloupe, MD  lisinopril (PRINIVIL,ZESTRIL) 10 MG tablet Take 10 mg by mouth daily.    [provider]  LORazepam (ATIVAN) 0.5 MG tablet Take 1 tablet (0.5 mg total) by mouth every 6 (six) hours as needed (Nausea or vomiting). 08/23/16   Sindy Guadeloupe, MD  Multiple Vitamin (MULTIVITAMIN) capsule Take 1 capsule by mouth daily.    [provider]  ondansetron (ZOFRAN) 8 MG tablet Take 1 tablet (8 mg total) by mouth 2 (two) times daily as needed for refractory nausea / vomiting. Start on day 3 after cyclophosphamide chemotherapy. 08/23/16   Sindy Guadeloupe, MD  predniSONE (DELTASONE) 20 MG tablet Take 5 tablets (100 mg total) by mouth daily. Take on days 1-5 of chemotherapy. 08/23/16   Sindy Guadeloupe, MD  Probiotic Product (PROBIOTIC PO) Take 1 capsule by mouth.    [provider]  prochlorperazine (COMPAZINE) 10 MG tablet Take 1 tablet (10 mg total) by mouth every 6 (six) hours as needed (Nausea or vomiting). 08/23/16   Sindy Guadeloupe, MD     Family  History  Problem Relation Age of Onset  . Coronary artery disease Mother   . Diabetes Mother   . Prostate cancer Father   . Pancreatitis Father   . Bladder Cancer Sister   . Diabetes Brother   . Diabetes Brother   . Prostate cancer Brother   . Diabetes Brother   . Diabetes Maternal Grandmother     Social History   Social History  . Marital status: Married    Spouse name: N/A  . Number of children: N/A  . Years of education: N/A   Social History Main Topics  . Smoking status: Former Smoker    Packs/day: 1.00    Years: 8.00    Types: Cigarettes    Quit date:  08/13/1974  . Smokeless tobacco: Former Systems developer    Types: Snuff  . Alcohol use Yes     Comment: rarely-one or two beer  . Drug use: No  . Sexual activity: Not Currently   Other Topics Concern  . Not on file   Social History Narrative  . No narrative on file    ECOG Status: 1 - Symptomatic but completely ambulatory  Review of Systems: A 12 point ROS discussed and pertinent positives are indicated in the HPI above.  All other systems are negative.  Review of Systems  Constitutional: Positive for appetite change and fatigue. Negative for activity change.  Respiratory: Negative.   Cardiovascular: Negative.   Gastrointestinal: Negative.   Genitourinary: Negative.     Vital Signs: There were no vitals taken for this visit.  Physical Exam  Constitutional: He appears well-developed and well-nourished.  HENT:  Head: Normocephalic and atraumatic.  Cardiovascular: Normal rate and regular rhythm.   Pulmonary/Chest: Effort normal and breath sounds normal.  Psychiatric: He has a normal mood and affect. His behavior is normal.  Nursing note and vitals reviewed.   Imaging: Nm Pet Image Initial (pi) Skull Base To Thigh  Result Date: 08/03/2016 CLINICAL DATA:  Initial treatment strategy for mesenteric adenopathy. EXAM: NUCLEAR MEDICINE PET SKULL BASE TO THIGH TECHNIQUE: 12.1 mCi F-18 FDG was injected intravenously. Full-ring PET imaging was performed from the skull base to thigh after the radiotracer. CT data was obtained and used for attenuation correction and anatomic localization. FASTING BLOOD GLUCOSE:  Value: 139 mg/dl COMPARISON:  CT abdomen pelvis 07/20/2016 and bone scan 07/19/2016. FINDINGS: NECK No hypermetabolic lymph nodes in the neck. CT images show no acute findings. CHEST Not hypermetabolic mediastinal, hilar or axillary lymph nodes. No hypermetabolic pulmonary nodules. Atherosclerotic calcification of the aorta and coronary arteries. Heart is at the upper limits of normal in  size. No pericardial or pleural effusion. ABDOMEN/PELVIS Gastrohepatic ligament lymph node measures 1.7 cm with an SUV max of 10.0. There are hypermetabolic retroperitoneal lymph nodes. Index retrocaval lymph node measures 10 mm (CT image 188) with an SUV max of 4.1. Matted mesenteric adenopathy measures up to 3.7 x 6.5 cm (CT image 202) with an SUV max of 11.5. No abnormal hypermetabolism in the liver, adrenal glands, spleen or pancreas. Liver, gallbladder and adrenal glands are unremarkable. Low-attenuation lesions in the kidneys measure up to 1.8 cm on the left, difficult to further characterize without post-contrast imaging. Spleen, pancreas, stomach and bowel are grossly unremarkable. Atherosclerotic calcification of the aorta. Prostatectomy. SKELETON No abnormal osseous hypermetabolism. IMPRESSION: 1. Hypermetabolic gastrohepatic ligament, abdominal retroperitoneal and small bowel mesenteric adenopathy, most indicative of lymphoma. 2. Aortic atherosclerosis (ICD10-170.0). Coronary artery calcification. Electronically Signed   By: Lorin Picket M.D.  On: 08/03/2016 11:41   Ct Biopsy  Result Date: 08/12/2016 INDICATION: Mesenteric adenopathy EXAM: CT BIOPSY MEDICATIONS: None. ANESTHESIA/SEDATION: Fentanyl 50 mcg IV; Versed 2 mg IV Moderate Sedation Time:  27 The patient was continuously monitored during the procedure by the interventional radiology nurse under my direct supervision. FLUOROSCOPY TIME:  None COMPLICATIONS: None immediate. PROCEDURE: Informed written consent was obtained from the patient after a thorough discussion of the procedural risks, benefits and alternatives. All questions were addressed. Maximal Sterile Barrier Technique was utilized including caps, mask, sterile gowns, sterile gloves, sterile drape, hand hygiene and skin antiseptic. A timeout was performed prior to the initiation of the procedure. Under CT guidance, a(n) 17 gauge guide needle was advanced into the mesenteric lymph  node. Subsequently 3 18 gauge core biopsies were obtained. Gel-Foam slurry was injected. The guide needle was removed. Post biopsy images demonstrate no hemorrhage. Patient tolerated the procedure well without complication. Vital sign monitoring by nursing staff during the procedure will continue as patient is in the special procedures unit for post procedure observation. FINDINGS: The images document guide needle placement within the mesenteric lymph node. Post biopsy images demonstrate no hemorrhage. IMPRESSION: Successful CT-guided mesenteric lymph node core biopsy. Electronically Signed   By: Marybelle Killings M.D.   On: 08/12/2016 11:25    Labs:  CBC:  Recent Labs  07/11/16 1111 08/12/16 0840 09/01/16 0822  WBC 9.2 8.1 8.5  HGB 15.7 14.8 14.6  HCT 46.4 43.0 41.7  PLT 232 215 191    COAGS:  Recent Labs  08/12/16 0840  INR 1.03  APTT 27    BMP:  Recent Labs  07/11/16 1111 08/23/16 1110 09/01/16 0822  NA 136 136 139  K 3.7 3.8 3.6  CL 101 99* 103  CO2 _0 GLUCOSE 163* 175* 141*  BUN 29* 27* 24*  CALCIUM 9.0 9.4 9.0  CREATININE 1.10 1.37* 0.98  GFRNONAA >60 50* >60  GFRAA >60 57* >60    LIVER FUNCTION TESTS:  Recent Labs  07/11/16 1111 08/23/16 1110  BILITOT 0.7 0.8  AST 25 23  ALT 27 23  ALKPHOS 86 89  PROT 7.5 7.6  ALBUMIN 4.0 4.2    TUMOR MARKERS: No results for input(s): AFPTM, CEA, CA199, CHROMGRNA in the last 8760 hours.  Assessment and Plan:  Christian Kelly is a 76 y.o. male with past medical history significant for hypertension, hyperlipidemia, chronic renal insufficiency, diabetes, colon and prostate cancer with recent diagnosis of lymphoma who presents today for CT-guided bone marrow biopsy and aspiration as well as portacatheter placement. Patient is accompanied by his wife though serves as his own historian.  Patient had right anterior chest wall portacatheter placed in [place] several years ago. This portacatheter was in place for  approximately 1 year.  Risks and Benefits of port placement were discussed with the patient including, but not limited to bleeding, infection, pneumothorax, or fibrin sheath development and need for additional procedures.  Risks and Benefits of CT guided BM Bx and aspiration were discussed with the patient including, but not limited to bleeding, infection, damage to adjacent structures or low yield requiring additional tests.  All of the patient's questions were answered, patient is agreeable to proceed with both procedures.  Consent signed and in chart.  Thank you for this interesting consult.  I greatly enjoyed meeting Christian Kelly and look forward to participating in their care.  A copy of this report was sent to the requesting provider on this date.  Electronically  Signed: Sandi Mariscal, MD 09/01/2016, 8:48 AM   I spent a total of 15 Minutes  in face to face in clinical consultation, greater than 50% of which was counseling/coordinating care for CT guided BM biopsy and aspiration and port placement.

## 2016-09-06 ENCOUNTER — Inpatient Hospital Stay (HOSPITAL_BASED_OUTPATIENT_CLINIC_OR_DEPARTMENT_OTHER): Payer: Medicare HMO | Admitting: Oncology

## 2016-09-06 ENCOUNTER — Inpatient Hospital Stay: Payer: Medicare HMO

## 2016-09-06 ENCOUNTER — Encounter: Payer: Self-pay | Admitting: Oncology

## 2016-09-06 VITALS — BP 132/75 | HR 76 | Temp 97.1°F | Resp 18 | Wt 197.8 lb

## 2016-09-06 DIAGNOSIS — Z79899 Other long term (current) drug therapy: Secondary | ICD-10-CM

## 2016-09-06 DIAGNOSIS — I2510 Atherosclerotic heart disease of native coronary artery without angina pectoris: Secondary | ICD-10-CM | POA: Diagnosis not present

## 2016-09-06 DIAGNOSIS — Z9221 Personal history of antineoplastic chemotherapy: Secondary | ICD-10-CM

## 2016-09-06 DIAGNOSIS — Z85048 Personal history of other malignant neoplasm of rectum, rectosigmoid junction, and anus: Secondary | ICD-10-CM | POA: Diagnosis not present

## 2016-09-06 DIAGNOSIS — N183 Chronic kidney disease, stage 3 (moderate): Secondary | ICD-10-CM

## 2016-09-06 DIAGNOSIS — Z8052 Family history of malignant neoplasm of bladder: Secondary | ICD-10-CM

## 2016-09-06 DIAGNOSIS — C61 Malignant neoplasm of prostate: Secondary | ICD-10-CM

## 2016-09-06 DIAGNOSIS — Z7189 Other specified counseling: Secondary | ICD-10-CM

## 2016-09-06 DIAGNOSIS — C8333 Diffuse large B-cell lymphoma, intra-abdominal lymph nodes: Secondary | ICD-10-CM

## 2016-09-06 DIAGNOSIS — Z5111 Encounter for antineoplastic chemotherapy: Secondary | ICD-10-CM

## 2016-09-06 DIAGNOSIS — Z87891 Personal history of nicotine dependence: Secondary | ICD-10-CM

## 2016-09-06 DIAGNOSIS — E785 Hyperlipidemia, unspecified: Secondary | ICD-10-CM | POA: Diagnosis not present

## 2016-09-06 DIAGNOSIS — K121 Other forms of stomatitis: Secondary | ICD-10-CM | POA: Diagnosis not present

## 2016-09-06 DIAGNOSIS — I129 Hypertensive chronic kidney disease with stage 1 through stage 4 chronic kidney disease, or unspecified chronic kidney disease: Secondary | ICD-10-CM | POA: Diagnosis not present

## 2016-09-06 DIAGNOSIS — Z5112 Encounter for antineoplastic immunotherapy: Secondary | ICD-10-CM | POA: Diagnosis not present

## 2016-09-06 DIAGNOSIS — Z85038 Personal history of other malignant neoplasm of large intestine: Secondary | ICD-10-CM

## 2016-09-06 DIAGNOSIS — E119 Type 2 diabetes mellitus without complications: Secondary | ICD-10-CM | POA: Diagnosis not present

## 2016-09-06 DIAGNOSIS — Z9079 Acquired absence of other genital organ(s): Secondary | ICD-10-CM | POA: Diagnosis not present

## 2016-09-06 DIAGNOSIS — Z7982 Long term (current) use of aspirin: Secondary | ICD-10-CM

## 2016-09-06 DIAGNOSIS — D709 Neutropenia, unspecified: Secondary | ICD-10-CM | POA: Diagnosis not present

## 2016-09-06 DIAGNOSIS — Z7689 Persons encountering health services in other specified circumstances: Secondary | ICD-10-CM | POA: Diagnosis not present

## 2016-09-06 DIAGNOSIS — Z923 Personal history of irradiation: Secondary | ICD-10-CM | POA: Diagnosis not present

## 2016-09-06 DIAGNOSIS — Z85828 Personal history of other malignant neoplasm of skin: Secondary | ICD-10-CM

## 2016-09-06 DIAGNOSIS — I700 Atherosclerosis of aorta: Secondary | ICD-10-CM | POA: Diagnosis not present

## 2016-09-06 DIAGNOSIS — Z8042 Family history of malignant neoplasm of prostate: Secondary | ICD-10-CM

## 2016-09-06 LAB — COMPREHENSIVE METABOLIC PANEL
ALT: 27 U/L (ref 17–63)
AST: 30 U/L (ref 15–41)
Albumin: 3.9 g/dL (ref 3.5–5.0)
Alkaline Phosphatase: 80 U/L (ref 38–126)
Anion gap: 7 (ref 5–15)
BILIRUBIN TOTAL: 0.9 mg/dL (ref 0.3–1.2)
BUN: 26 mg/dL — AB (ref 6–20)
CHLORIDE: 101 mmol/L (ref 101–111)
CO2: 29 mmol/L (ref 22–32)
Calcium: 9.2 mg/dL (ref 8.9–10.3)
Creatinine, Ser: 1.22 mg/dL (ref 0.61–1.24)
GFR calc Af Amer: >60 mL/min (ref >60)
GFR calc non Af Amer: 57 mL/min — ABNORMAL LOW (ref >60)
GLUCOSE: 307 mg/dL — AB (ref 65–99)
POTASSIUM: 3.7 mmol/L (ref 3.5–5.1)
Sodium: 137 mmol/L (ref 135–145)
Total Protein: 7 g/dL (ref 6.5–8.1)

## 2016-09-06 LAB — CBC
HEMATOCRIT: 42.6 % (ref 40.0–52.0)
Hemoglobin: 14.8 g/dL (ref 13.0–18.0)
MCH: 31.7 pg (ref 26.0–34.0)
MCHC: 34.7 g/dL (ref 32.0–36.0)
MCV: 91.4 fL (ref 80.0–100.0)
PLATELETS: 210 10*3/uL (ref 150–440)
RBC: 4.67 MIL/uL (ref 4.40–5.90)
RDW: 12.6 % (ref 11.5–14.5)
WBC: 8.1 10*3/uL (ref 3.8–10.6)

## 2016-09-06 MED ORDER — DIPHENHYDRAMINE HCL 25 MG PO CAPS
50.0000 mg | ORAL_CAPSULE | Freq: Once | ORAL | Status: AC
Start: 2016-09-06 — End: 2016-09-06
  Administered 2016-09-06: 50 mg via ORAL
  Filled 2016-09-06: qty 2

## 2016-09-06 MED ORDER — SODIUM CHLORIDE 0.9 % IV SOLN
750.0000 mg/m2 | Freq: Once | INTRAVENOUS | Status: AC
  Administered 2016-09-06: 1600 mg via INTRAVENOUS
  Filled 2016-09-06: qty 80

## 2016-09-06 MED ORDER — PEGFILGRASTIM 6 MG/0.6ML ~~LOC~~ PSKT
6.0000 mg | PREFILLED_SYRINGE | Freq: Once | SUBCUTANEOUS | Status: AC
  Administered 2016-09-06: 6 mg via SUBCUTANEOUS
  Filled 2016-09-06: qty 0.6

## 2016-09-06 MED ORDER — DOXORUBICIN HCL CHEMO IV INJECTION 2 MG/ML
100.0000 mg | Freq: Once | INTRAVENOUS | Status: AC
  Administered 2016-09-06: 100 mg via INTRAVENOUS
  Filled 2016-09-06: qty 50

## 2016-09-06 MED ORDER — SODIUM CHLORIDE 0.9 % IV SOLN
Freq: Once | INTRAVENOUS | Status: AC
  Administered 2016-09-06: 11:00:00 via INTRAVENOUS
  Filled 2016-09-06: qty 1000

## 2016-09-06 MED ORDER — HEPARIN SOD (PORK) LOCK FLUSH 100 UNIT/ML IV SOLN
500.0000 [IU] | Freq: Once | INTRAVENOUS | Status: DC | PRN
  Filled 2016-09-06: qty 5

## 2016-09-06 MED ORDER — VINCRISTINE SULFATE CHEMO INJECTION 1 MG/ML
2.0000 mg | Freq: Once | INTRAVENOUS | Status: AC
  Administered 2016-09-06: 2 mg via INTRAVENOUS
  Filled 2016-09-06: qty 2

## 2016-09-06 MED ORDER — PALONOSETRON HCL INJECTION 0.25 MG/5ML
0.2500 mg | Freq: Once | INTRAVENOUS | Status: AC
  Administered 2016-09-06: 0.25 mg via INTRAVENOUS
  Filled 2016-09-06: qty 5

## 2016-09-06 MED ORDER — DEXAMETHASONE SODIUM PHOSPHATE 10 MG/ML IJ SOLN
10.0000 mg | Freq: Once | INTRAMUSCULAR | Status: AC
  Administered 2016-09-06: 10 mg via INTRAVENOUS
  Filled 2016-09-06: qty 1

## 2016-09-06 MED ORDER — ACETAMINOPHEN 325 MG PO TABS
650.0000 mg | ORAL_TABLET | Freq: Once | ORAL | Status: AC
  Administered 2016-09-06: 650 mg via ORAL
  Filled 2016-09-06: qty 2

## 2016-09-06 MED ORDER — DEXAMETHASONE SODIUM PHOSPHATE 100 MG/10ML IJ SOLN: 10.0000 mg | Freq: Once | INTRAMUSCULAR | Status: DC

## 2016-09-06 MED ORDER — OMEPRAZOLE 20 MG PO CPDR: 20.0000 mg | DELAYED_RELEASE_CAPSULE | Freq: Every day | ORAL | 1 refills | Status: DC

## 2016-09-06 MED ORDER — SODIUM CHLORIDE 0.9 % IV SOLN
375.0000 mg/m2 | Freq: Once | INTRAVENOUS | Status: AC
  Administered 2016-09-06: 800 mg via INTRAVENOUS
  Filled 2016-09-06: qty 50

## 2016-09-06 NOTE — Progress Notes (Signed)
Here for follow up. Starting chemo today

## 2016-09-06 NOTE — Progress Notes (Signed)
Hematology/Oncology Consult note [facility]  Telephone:([phone] Fax:([phone]) [phone]  Patient Care Team: Ezequiel Kayser, MD as PCP - General (Internal Medicine) Hollice Espy, MD as Consulting Physician (Urology)   Name of the patient: Christian Kelly  [id number]  [birth year]   Date of visit: 09/06/16  Diagnosis- 1. 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 Atleast Stage II DLBCL GCB. Not double hit. FISH studies pending.   Chief complaint/ Reason for visit- on treatment assessment prior to cycle 1 of RCHOP chemotherapy  Heme/Onc history: 1. Patient is a 76 yr old male with a h/o prostate cancer diagnosed in 1999s/p radical prostatectomy. Prior to that he had colon cancer in 1998 s/o surgery and adjuvant chemotherapy in 1998. With regards to prostate cancer- surgery was done at [facility] in [place] and he was followed at [place] subsequently. Patient think his Gleasons score was 7 at diagnosis. He has not required any radiation therapy or ADT so far. Patient still spends 4-5 months at [place].   2. Dr. Raechel Ache has been monitoring his PSArecently and his recent trend of PSA has been as follows: psa was 0.33 in feb 2017 and 0.63 in April 2018. We have 1 psa from 2015 when it was 0.3  3. Patient was referred to Dr. Erlene Quan urology and Dr. Baruch Gouty for salvage radiation.   4. Dr. Erlene Quan ordered CT abdomen which showed: IMPRESSION: 1. Status post prostatectomy, without locally recurrent disease. 2. Extensive abdominal adenopathy. Given the clinical history, most likely related to metastatic prostate carcinoma. Lymphoma could look similar. 3. Coronary artery atherosclerosis. Aortic atherosclerosis. 4. Vague upper sacral sclerosis is felt unlikely to represent metastatic disease, given lack of correlate on yesterday's bone scan. Correlate with radiation therapy, as radiation induced necrosis could have this appearance.  Recommend attention on follow-up.  5. This was followed by PET/CT scan which showed: IMPRESSION: 1. Hypermetabolic gastrohepatic ligament, abdominal retroperitoneal and small bowel mesenteric adenopathy, most indicative of lymphoma. 2. Aortic atherosclerosis (ICD10-170.0). Coronary artery Calcification.  6. Patient underwent CT-guided biopsy of the mesenteric lymph node which showed:DIAGNOSIS:  A. LYMPH NODE, MESENTERIC; CT-GUIDED CORE BIOPSY:  - LARGE B-CELL LYMPHOMA, CD10 POSITIVE.   Comment:  There is a diffuse proliferation of large lymphocytes with irregular  nuclear contours, inconspicuous nucleoli, and scant cytoplasm. The flow  cytometry and IHC results are most consistent with diffuse large B-cell  lymphoma, not otherwise specified, germinal center B-cell type. However,  it is likely that Herricks for MYC, BCL2 and BCL6 gene rearrangements will  be ordered, so final classification will be based on the Otis result  7. Bone marrow biopsy was negative for lymphoma  Interval history- doing well. Denies any complaints  ECOG PS- 0 Pain scale- 0   Review of systems- Review of Systems  Constitutional: Negative for chills, fever, malaise/fatigue and weight loss.  HENT: Negative for congestion, ear discharge and nosebleeds.   Eyes: Negative for blurred vision.  Respiratory: Negative for cough, hemoptysis, sputum production, shortness of breath and wheezing.   Cardiovascular: Negative for chest pain, palpitations, orthopnea and claudication.  Gastrointestinal: Negative for abdominal pain, blood in stool, constipation, diarrhea, heartburn, melena, nausea and vomiting.  Genitourinary: Negative for dysuria, flank pain, frequency, hematuria and urgency.  Musculoskeletal: Negative for back pain, joint pain and myalgias.  Skin: Negative for rash.  Neurological: Negative for dizziness, tingling, focal weakness, seizures, weakness and headaches.  Endo/Heme/Allergies: Does not bruise/bleed  easily.  Psychiatric/Behavioral: Negative for depression and suicidal ideas. The  patient does not have insomnia.       No Known Allergies   Past Medical History:  Diagnosis Date  . Arthritis   . Basal cell carcinoma 2008   forehead  . Cancer ([place])   . Chronic renal insufficiency, stage III (moderate)   . Colon cancer ([place]) 1998  . Diabetes mellitus without complication ([place])   . History of kidney stones   . Hyperlipemia   . Hypertension   . Lyme disease   . Prostate cancer ([place]) 1999     Past Surgical History:  Procedure Laterality Date  . COLON SURGERY  1998  . COLONOSCOPY    . COLONOSCOPY WITH PROPOFOL N/A 12/29/2014   Procedure: COLONOSCOPY WITH PROPOFOL;  Surgeon: Lollie Sails, MD;  Location: [facility] ENDOSCOPY;  Service: Endoscopy;  Laterality: N/A;  . EYE SURGERY    . FRACTURE SURGERY    . IR FLUORO GUIDE PORT INSERTION RIGHT  09/01/2016  . LITHOTRIPSY    . PROSTATECTOMY  1998  . TONSILLECTOMY      Social History   Social History  . Marital status: Married    Spouse name: N/A  . Number of children: N/A  . Years of education: N/A   Occupational History  . Not on file.   Social History Main Topics  . Smoking status: Former Smoker    Packs/day: 1.00    Years: 8.00    Types: Cigarettes    Quit date: 08/13/1974  . Smokeless tobacco: Former Systems developer    Types: Snuff  . Alcohol use Yes     Comment: rarely-one or two beer  . Drug use: No  . Sexual activity: Not Currently   Other Topics Concern  . Not on file   Social History Narrative  . No narrative on file    Family History  Problem Relation Age of Onset  . Coronary artery disease Mother   . Diabetes Mother   . Prostate cancer Father   . Pancreatitis Father   . Bladder Cancer Sister   . Diabetes Brother   . Diabetes Brother   . Prostate cancer Brother   . Diabetes Brother   . Diabetes Maternal Grandmother      Current Outpatient Prescriptions:  .  allopurinol (ZYLOPRIM) 300 MG tablet,  Take 1 tablet (300 mg total) by mouth daily., Disp: 30 tablet, Rfl: 3 .  aspirin EC 81 MG tablet, Take 81 mg by mouth every other day. , Disp: , Rfl:  .  atorvastatin (LIPITOR) 40 MG tablet, Take 40 mg by mouth daily., Disp: , Rfl:  .  hydrochlorothiazide (HYDRODIURIL) 25 MG tablet, Take 25 mg by mouth daily., Disp: , Rfl:  .  lidocaine-prilocaine (EMLA) cream, Apply to affected area once, Disp: 30 g, Rfl: 3 .  lisinopril (PRINIVIL,ZESTRIL) 10 MG tablet, Take 10 mg by mouth daily., Disp: , Rfl:  .  LORazepam (ATIVAN) 0.5 MG tablet, Take 1 tablet (0.5 mg total) by mouth every 6 (six) hours as needed (Nausea or vomiting)., Disp: 30 tablet, Rfl: 0 .  Multiple Vitamin (MULTIVITAMIN) capsule, Take 1 capsule by mouth daily., Disp: , Rfl:  .  ondansetron (ZOFRAN) 8 MG tablet, Take 1 tablet (8 mg total) by mouth 2 (two) times daily as needed for refractory nausea / vomiting. Start on day 3 after cyclophosphamide chemotherapy., Disp: 30 tablet, Rfl: 1 .  predniSONE (DELTASONE) 20 MG tablet, Take 5 tablets (100 mg total) by mouth daily. Take on days 1-5 of chemotherapy., Disp: 5 tablet, Rfl:  0 .  Probiotic Product (PROBIOTIC PO), Take 1 capsule by mouth., Disp: , Rfl:  .  prochlorperazine (COMPAZINE) 10 MG tablet, Take 1 tablet (10 mg total) by mouth every 6 (six) hours as needed (Nausea or vomiting)., Disp: 30 tablet, Rfl: 6  Physical exam:  Vitals:   09/06/16 0843  BP: 132/75  Pulse: 76  Resp: 18  Temp: (!) 97.1 F (36.2 C)  TempSrc: Tympanic  Weight: 197 lb 12.8 oz (89.7 kg)   Physical Exam  Constitutional: He is oriented to person, place, and time and well-developed, well-nourished, and in no distress.  HENT:  Head: Normocephalic and atraumatic.  Eyes: Pupils are equal, round, and reactive to light. EOM are normal.  Neck: Normal range of motion.  Cardiovascular: Normal rate, regular rhythm and normal heart sounds.   Pulmonary/Chest: Effort normal and breath sounds normal.  Abdominal: Soft.  Bowel sounds are normal.  Neurological: He is alert and oriented to person, place, and time.  Skin: Skin is warm and dry.     CMP Latest Ref Rng & Units 09/01/2016  Glucose 65 - 99 mg/dL 141(H)  BUN 6 - 20 mg/dL 24(H)  Creatinine 0.61 - 1.24 mg/dL 0.98  Sodium 135 - 145 mmol/L 139  Potassium 3.5 - 5.1 mmol/L 3.6  Chloride 101 - 111 mmol/L 103  CO2 22 - 32 mmol/L 27  Calcium 8.9 - 10.3 mg/dL 9.0  Total Protein 6.5 - 8.1 g/dL -  Total Bilirubin 0.3 - 1.2 mg/dL -  Alkaline Phos 38 - 126 U/L -  AST 15 - 41 U/L -  ALT 17 - 63 U/L -   CBC Latest Ref Rng & Units 09/01/2016  WBC 3.8 - 10.6 K/uL 8.5  Hemoglobin 13.0 - 18.0 g/dL 14.6  Hematocrit 40.0 - 52.0 % 41.7  Platelets 150 - 440 K/uL 191    No images are attached to the encounter.  Nm Cardiac Muga Rest  Result Date: 09/05/2016 CLINICAL DATA:  Lymphoma.  Colon cancer and prostate cancer. EXAM: NUCLEAR MEDICINE CARDIAC BLOOD POOL IMAGING (MUGA) TECHNIQUE: Cardiac multi-gated acquisition was performed at rest following intravenous injection of Tc-1mlabeled red blood cells. RADIOPHARMACEUTICALS:  21.9 mCi Tc-91mDP in-vitro labeled red blood cells IV COMPARISON:  None. FINDINGS: The left ventricular ejection fraction is equal to 62.2%. Normal left ventricular wall motion. IMPRESSION: 1. Left ventricular ejection fraction is equal to 62.2%. Electronically Signed   By: TaKerby Moors.D.   On: 09/05/2016 08:21   Ct Biopsy  Result Date: 08/12/2016 INDICATION: Mesenteric adenopathy EXAM: CT BIOPSY MEDICATIONS: None. ANESTHESIA/SEDATION: Fentanyl 50 mcg IV; Versed 2 mg IV Moderate Sedation Time:  27 The patient was continuously monitored during the procedure by the interventional radiology nurse under my direct supervision. FLUOROSCOPY TIME:  None COMPLICATIONS: None immediate. PROCEDURE: Informed written consent was obtained from the patient after a thorough discussion of the procedural risks, benefits and alternatives. All questions were  addressed. Maximal Sterile Barrier Technique was utilized including caps, mask, sterile gowns, sterile gloves, sterile drape, hand hygiene and skin antiseptic. A timeout was performed prior to the initiation of the procedure. Under CT guidance, a(n) 17 gauge guide needle was advanced into the mesenteric lymph node. Subsequently 3 18 gauge core biopsies were obtained. Gel-Foam slurry was injected. The guide needle was removed. Post biopsy images demonstrate no hemorrhage. Patient tolerated the procedure well without complication. Vital sign monitoring by nursing staff during the procedure will continue as patient is in the special procedures unit for post  procedure observation. FINDINGS: The images document guide needle placement within the mesenteric lymph node. Post biopsy images demonstrate no hemorrhage. IMPRESSION: Successful CT-guided mesenteric lymph node core biopsy. Electronically Signed   By: Marybelle Killings M.D.   On: 08/12/2016 11:25   Ct Bone Marrow Biopsy & Aspiration  Result Date: 09/01/2016 INDICATION: History of lymphoma. Please perform CT-guided biopsy for tissue diagnostic purposes. EXAM: CT-GUIDED BONE MARROW BIOPSY AND ASPIRATION MEDICATIONS: None ANESTHESIA/SEDATION: Fentanyl 75 mcg IV; Versed 2.5 mg IV Sedation Time: 15 minutes; The patient was continuously monitored during the procedure by the interventional radiology nurse under my direct supervision. COMPLICATIONS: None immediate. PROCEDURE: Informed consent was obtained from the patient following an explanation of the procedure, risks, benefits and alternatives. The patient understands, agrees and consents for the procedure. All questions were addressed. A time out was performed prior to the initiation of the procedure. The patient was positioned prone and non-contrast localization CT was performed of the pelvis to demonstrate the iliac marrow spaces. The operative site was prepped and draped in the usual sterile fashion. Under sterile  conditions and local anesthesia, a 22 gauge spinal needle was utilized for procedural planning. Next, an 11 gauge coaxial bone biopsy needle was advanced into the left iliac marrow space. Needle position was confirmed with CT imaging. Initially, bone marrow aspiration was performed. Next, a bone marrow biopsy was obtained with the 11 gauge outer bone marrow device. The 11 gauge coaxial bone biopsy needle was re-advanced into a slightly different location within the left iliac marrow space, positioning was confirmed and an additional bone marrow biopsy was obtained. Samples were prepared with the cytotechnologist and deemed adequate. The needle was removed intact. Hemostasis was obtained with compression and a dressing was placed. The patient tolerated the procedure well without immediate post procedural complication. IMPRESSION: Successful CT guided left iliac bone marrow aspiration and core biopsy. Electronically Signed   By: Sandi Mariscal M.D.   On: 09/01/2016 10:53   Ir Fluoro Guide Port Insertion Right  Result Date: 09/01/2016 INDICATION: History of lymphoma. In need of durable intravenous access for chemotherapy administration. EXAM: IMPLANTED PORT A CATH PLACEMENT WITH ULTRASOUND AND FLUOROSCOPIC GUIDANCE COMPARISON:  PET-CT - 08/03/2016 MEDICATIONS: Ancef 2 gm IV; The antibiotic was administered within an appropriate time interval prior to skin puncture. ANESTHESIA/SEDATION: Moderate (conscious) sedation was employed during this procedure. A total of Versed 1 mg and Fentanyl 50 mcg was administered intravenously. Moderate Sedation Time: 26 minutes. The patient's level of consciousness and vital signs were monitored continuously by radiology nursing throughout the procedure under my direct supervision. CONTRAST:  None FLUOROSCOPY TIME:  30 seconds (650 mGy) COMPLICATIONS: None immediate. PROCEDURE: The procedure, risks, benefits, and alternatives were explained to the patient. Questions regarding the  procedure were encouraged and answered. The patient understands and consents to the procedure. The right neck and chest were prepped with chlorhexidine in a sterile fashion, and a sterile drape was applied covering the operative field. Maximum barrier sterile technique with sterile gowns and gloves were used for the procedure. A timeout was performed prior to the initiation of the procedure. Local anesthesia was provided with 1% lidocaine with epinephrine. After creating a small venotomy incision, a micropuncture kit was utilized to access the internal jugular vein. Real-time ultrasound guidance was utilized for vascular access including the acquisition of a permanent ultrasound image documenting patency of the accessed vessel. The microwire was utilized to measure appropriate catheter length. A subcutaneous port pocket was then created along the upper  chest wall utilizing a combination of sharp and blunt dissection. The pocket was irrigated with sterile saline. A single lumen clear vue power injectable port was chosen for placement. The 8 Fr catheter was tunneled from the port pocket site to the venotomy incision. The port was placed in the pocket. The external catheter was trimmed to appropriate length. At the venotomy, an 8 Fr peel-away sheath was placed over a guidewire under fluoroscopic guidance. The catheter was then placed through the sheath and the sheath was removed. Final catheter positioning was confirmed and documented with a fluoroscopic spot radiograph. The port was accessed with a Huber needle, aspirated and flushed with heparinized saline. The venotomy site was closed with an interrupted 4-0 Vicryl suture. The port pocket incision was closed with interrupted 2-0 Vicryl suture and the skin was opposed with a running subcuticular 4-0 Vicryl suture. Dermabond and Steri-strips were applied to both incisions. Dressings were placed. The patient tolerated the procedure well without immediate post  procedural complication. FINDINGS: After catheter placement, the tip lies within the superior cavoatrial junction. The catheter aspirates and flushes normally and is ready for immediate use. IMPRESSION: Successful placement of a right internal jugular approach power injectable Port-A-Cath. The catheter is ready for immediate use. Electronically Signed   By: Sandi Mariscal M.D.   On: 09/01/2016 15:44     Assessment and plan- Patient is a 76 y.o. male with newly diagnosed Stage II DLBCL GCB subtype. Not double hit. Here for assessment prior to cycle 1 of RCHOP  Counts ok to proceed with cycle 1 of RCHOP with neulasta support. Baseline MUGA scan normal. He falls in the low risk group with IPI of 1 which translates into 5 yr OS of 73% and CR rate of 87%. He does not meet criteria for CNS prophylaxis. Chemotherapy will be given with curative intent.   Patient knows to take tylenol, claritin for neulasta associated bone pain  I will see him in 1 weeks time for interim count check and assess chemo tolerance   Visit Diagnosis 1. Diffuse large B-cell lymphoma of intra-abdominal lymph nodes ([place])   2. Goals of care, counseling/discussion   3. Encounter for antineoplastic chemotherapy      Dr. Randa Evens, MD, MPH [facility] Pager- [phone] 09/06/2016 9:34 AM

## 2016-09-12 LAB — SURGICAL PATHOLOGY

## 2016-09-13 ENCOUNTER — Encounter: Payer: Self-pay | Admitting: Oncology

## 2016-09-13 ENCOUNTER — Inpatient Hospital Stay (HOSPITAL_BASED_OUTPATIENT_CLINIC_OR_DEPARTMENT_OTHER): Payer: Medicare HMO | Admitting: Oncology

## 2016-09-13 ENCOUNTER — Inpatient Hospital Stay: Payer: Medicare HMO

## 2016-09-13 VITALS — BP 115/77 | HR 78 | Temp 97.6°F | Ht 72.0 in | Wt 196.4 lb

## 2016-09-13 DIAGNOSIS — Z8052 Family history of malignant neoplasm of bladder: Secondary | ICD-10-CM

## 2016-09-13 DIAGNOSIS — K121 Other forms of stomatitis: Secondary | ICD-10-CM | POA: Diagnosis not present

## 2016-09-13 DIAGNOSIS — Z9221 Personal history of antineoplastic chemotherapy: Secondary | ICD-10-CM

## 2016-09-13 DIAGNOSIS — D709 Neutropenia, unspecified: Secondary | ICD-10-CM | POA: Diagnosis not present

## 2016-09-13 DIAGNOSIS — I129 Hypertensive chronic kidney disease with stage 1 through stage 4 chronic kidney disease, or unspecified chronic kidney disease: Secondary | ICD-10-CM | POA: Diagnosis not present

## 2016-09-13 DIAGNOSIS — N183 Chronic kidney disease, stage 3 (moderate): Secondary | ICD-10-CM | POA: Diagnosis not present

## 2016-09-13 DIAGNOSIS — Z79899 Other long term (current) drug therapy: Secondary | ICD-10-CM

## 2016-09-13 DIAGNOSIS — Z5111 Encounter for antineoplastic chemotherapy: Secondary | ICD-10-CM | POA: Diagnosis not present

## 2016-09-13 DIAGNOSIS — Z87891 Personal history of nicotine dependence: Secondary | ICD-10-CM

## 2016-09-13 DIAGNOSIS — Z7982 Long term (current) use of aspirin: Secondary | ICD-10-CM

## 2016-09-13 DIAGNOSIS — E785 Hyperlipidemia, unspecified: Secondary | ICD-10-CM | POA: Diagnosis not present

## 2016-09-13 DIAGNOSIS — C61 Malignant neoplasm of prostate: Secondary | ICD-10-CM

## 2016-09-13 DIAGNOSIS — Z85828 Personal history of other malignant neoplasm of skin: Secondary | ICD-10-CM

## 2016-09-13 DIAGNOSIS — Z923 Personal history of irradiation: Secondary | ICD-10-CM

## 2016-09-13 DIAGNOSIS — E119 Type 2 diabetes mellitus without complications: Secondary | ICD-10-CM

## 2016-09-13 DIAGNOSIS — I700 Atherosclerosis of aorta: Secondary | ICD-10-CM | POA: Diagnosis not present

## 2016-09-13 DIAGNOSIS — Z9079 Acquired absence of other genital organ(s): Secondary | ICD-10-CM | POA: Diagnosis not present

## 2016-09-13 DIAGNOSIS — I2510 Atherosclerotic heart disease of native coronary artery without angina pectoris: Secondary | ICD-10-CM

## 2016-09-13 DIAGNOSIS — Z8042 Family history of malignant neoplasm of prostate: Secondary | ICD-10-CM

## 2016-09-13 DIAGNOSIS — Z85048 Personal history of other malignant neoplasm of rectum, rectosigmoid junction, and anus: Secondary | ICD-10-CM | POA: Diagnosis not present

## 2016-09-13 DIAGNOSIS — C8333 Diffuse large B-cell lymphoma, intra-abdominal lymph nodes: Secondary | ICD-10-CM

## 2016-09-13 DIAGNOSIS — Z5112 Encounter for antineoplastic immunotherapy: Secondary | ICD-10-CM | POA: Diagnosis not present

## 2016-09-13 DIAGNOSIS — Z7689 Persons encountering health services in other specified circumstances: Secondary | ICD-10-CM | POA: Diagnosis not present

## 2016-09-13 LAB — CBC WITH DIFFERENTIAL/PLATELET
BASOS ABS: 0 10*3/uL (ref 0–0.1)
Basophils Relative: 1 %
Eosinophils Absolute: 0.3 10*3/uL (ref 0–0.7)
Eosinophils Relative: 16 %
HEMATOCRIT: 41.6 % (ref 40.0–52.0)
Hemoglobin: 14.5 g/dL (ref 13.0–18.0)
LYMPHS PCT: 35 %
Lymphs Abs: 0.7 10*3/uL — ABNORMAL LOW (ref 1.0–3.6)
MCH: 31.5 pg (ref 26.0–34.0)
MCHC: 34.7 g/dL (ref 32.0–36.0)
MCV: 90.7 fL (ref 80.0–100.0)
MONO ABS: 0.1 10*3/uL — AB (ref 0.2–1.0)
Monocytes Relative: 7 %
NEUTROS ABS: 0.8 10*3/uL — AB (ref 1.4–6.5)
Neutrophils Relative %: 41 %
Platelets: 133 10*3/uL — ABNORMAL LOW (ref 150–440)
RBC: 4.59 MIL/uL (ref 4.40–5.90)
RDW: 12.7 % (ref 11.5–14.5)
WBC: 1.9 10*3/uL — ABNORMAL LOW (ref 3.8–10.6)

## 2016-09-13 LAB — COMPREHENSIVE METABOLIC PANEL
ALK PHOS: 95 U/L (ref 38–126)
ALT: 20 U/L (ref 17–63)
AST: 16 U/L (ref 15–41)
Albumin: 3.6 g/dL (ref 3.5–5.0)
Anion gap: 8 (ref 5–15)
BILIRUBIN TOTAL: 1.2 mg/dL (ref 0.3–1.2)
BUN: 26 mg/dL — AB (ref 6–20)
CALCIUM: 9 mg/dL (ref 8.9–10.3)
CO2: 32 mmol/L (ref 22–32)
CREATININE: 1.25 mg/dL — AB (ref 0.61–1.24)
Chloride: 97 mmol/L — ABNORMAL LOW (ref 101–111)
GFR calc Af Amer: >60 mL/min (ref >60)
GFR, EST NON AFRICAN AMERICAN: 55 mL/min — AB (ref >60)
Glucose, Bld: 140 mg/dL — ABNORMAL HIGH (ref 65–99)
Potassium: 4.7 mmol/L (ref 3.5–5.1)
Sodium: 137 mmol/L (ref 135–145)
TOTAL PROTEIN: 6.5 g/dL (ref 6.5–8.1)

## 2016-09-13 NOTE — Progress Notes (Signed)
Patient here for treatment follow up. He has developed and mouth ulcer since his last visit. No N/V, appetite good.

## 2016-09-13 NOTE — Progress Notes (Signed)
Hematology/Oncology Consult note [facility]  Telephone:([phone] Fax:([phone]  Patient Care Team: Ezequiel Kayser, MD as PCP - General (Internal Medicine) Hollice Espy, MD as Consulting Physician (Urology)   Name of the patient: Christian Kelly  [id number]  [birth year]   Date of visit: 09/13/16 Diagnosis- 1. 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 Atleast Stage II DLBCL GCB. [place] for double hit pending  Chief complaint/ Reason for visit- assess tolerance to cycle 1 of RCHOP  Heme/Onc history: 1. Patient is a 76 yr old male with a h/o prostate cancer diagnosed in 1999s/p radical prostatectomy. Prior to that he had colon cancer in 1998 s/o surgery and adjuvant chemotherapy in 1998. With regards to prostate cancer- surgery was done at [facility] in [place] and he was followed at [place] subsequently. Patient think his Gleasons score was 7 at diagnosis. He has not required any radiation therapy or ADT so far. Patient still spends 4-5 months at [place].   2. Dr. Raechel Ache has been monitoring his PSArecently and his recent trend of PSA has been as follows: psa was 0.33 in feb 2017 and 0.63 in April 2018. We have 1 psa from 2015 when it was 0.3  3. Patient was referred to Dr. Erlene Quan urology and Dr. Baruch Gouty for salvage radiation.   4. Dr. Erlene Quan ordered CT abdomen which showed: IMPRESSION: 1. Status post prostatectomy, without locally recurrent disease. 2. Extensive abdominal adenopathy. Given the clinical history, most likely related to metastatic prostate carcinoma. Lymphoma could look similar. 3. Coronary artery atherosclerosis. Aortic atherosclerosis. 4. Vague upper sacral sclerosis is felt unlikely to represent metastatic disease, given lack of correlate on yesterday's bone scan. Correlate with radiation therapy, as radiation induced necrosis could have this appearance. Recommend attention  on follow-up.  5. This was followed by PET/CT scan which showed: IMPRESSION: 1. Hypermetabolic gastrohepatic ligament, abdominal retroperitoneal and small bowel mesenteric adenopathy, most indicative of lymphoma. 2. Aortic atherosclerosis (ICD10-170.0). Coronary artery Calcification.  6. Patient underwent CT-guided biopsy of the mesenteric lymph node which showed:DIAGNOSIS:  A. LYMPH NODE, MESENTERIC; CT-GUIDED CORE BIOPSY:  - LARGE B-CELL LYMPHOMA, CD10 POSITIVE.   Comment:  There is a diffuse proliferation of large lymphocytes with irregular  nuclear contours, inconspicuous nucleoli, and scant cytoplasm. The flow  cytometry and IHC results are most consistent with diffuse large B-cell  lymphoma, not otherwise specified, germinal center B-cell type. However,  it is likely that [place] for MYC, BCL2 and BCL6 gene rearrangements will  be ordered, so final classification will be based on the [place] result  7. Bone marrow biopsy was negative for lymphoma    Interval history- no nausea. Has a solitary mouth sore that has not been bothering him as much  ECOG PS- 1 Pain scale- 0   Review of systems- Review of Systems  Constitutional: Positive for malaise/fatigue.  HENT:       Mouth sore       Allergies  Allergen Reactions  . Tape Rash     Past Medical History:  Diagnosis Date  . Arthritis   . Basal cell carcinoma 2008   forehead  . Cancer ([place])   . Chronic renal insufficiency, stage III (moderate)   . Colon cancer ([place]) 1998  . Diabetes mellitus without complication ([place])   . History of kidney stones   . Hyperlipemia   . Hypertension   . Lyme disease   . Prostate cancer ([place]) 1999     Past Surgical History:  Procedure Laterality Date  . COLON SURGERY  1998  . COLONOSCOPY    . COLONOSCOPY WITH PROPOFOL N/A 12/29/2014   Procedure: COLONOSCOPY WITH PROPOFOL;  Surgeon: Lollie Sails, MD;  Location: [facility] ENDOSCOPY;  Service: Endoscopy;  Laterality: N/A;  .  EYE SURGERY    . FRACTURE SURGERY    . IR FLUORO GUIDE PORT INSERTION RIGHT  09/01/2016  . LITHOTRIPSY    . PROSTATECTOMY  1998  . TONSILLECTOMY      Social History   Social History  . Marital status: Married    Spouse name: N/A  . Number of children: N/A  . Years of education: N/A   Occupational History  . Not on file.   Social History Main Topics  . Smoking status: Former Smoker    Packs/day: 1.00    Years: 8.00    Types: Cigarettes    Quit date: 08/13/1974  . Smokeless tobacco: Former Systems developer    Types: Snuff  . Alcohol use Yes     Comment: rarely-one or two beer  . Drug use: No  . Sexual activity: Not Currently   Other Topics Concern  . Not on file   Social History Narrative  . No narrative on file    Family History  Problem Relation Age of Onset  . Coronary artery disease Mother   . Diabetes Mother   . Prostate cancer Father   . Pancreatitis Father   . Bladder Cancer Sister   . Diabetes Brother   . Diabetes Brother   . Prostate cancer Brother   . Diabetes Brother   . Diabetes Maternal Grandmother      Current Outpatient Prescriptions:  .  allopurinol (ZYLOPRIM) 300 MG tablet, Take 1 tablet (300 mg total) by mouth daily. (Patient not taking: Reported on 09/06/2016), Disp: 30 tablet, Rfl: 3 .  aspirin EC 81 MG tablet, Take 81 mg by mouth every other day. , Disp: , Rfl:  .  atorvastatin (LIPITOR) 40 MG tablet, Take 40 mg by mouth daily., Disp: , Rfl:  .  hydrochlorothiazide (HYDRODIURIL) 25 MG tablet, Take 25 mg by mouth daily., Disp: , Rfl:  .  lidocaine-prilocaine (EMLA) cream, Apply to affected area once, Disp: 30 g, Rfl: 3 .  lisinopril (PRINIVIL,ZESTRIL) 10 MG tablet, Take 10 mg by mouth daily., Disp: , Rfl:  .  LORazepam (ATIVAN) 0.5 MG tablet, Take 1 tablet (0.5 mg total) by mouth every 6 (six) hours as needed (Nausea or vomiting). (Patient not taking: Reported on 09/06/2016), Disp: 30 tablet, Rfl: 0 .  Multiple Vitamin (MULTIVITAMIN) capsule, Take 1  capsule by mouth daily., Disp: , Rfl:  .  omeprazole (PRILOSEC) 20 MG capsule, Take 1 capsule (20 mg total) by mouth daily., Disp: 30 capsule, Rfl: 1 .  ondansetron (ZOFRAN) 8 MG tablet, Take 1 tablet (8 mg total) by mouth 2 (two) times daily as needed for refractory nausea / vomiting. Start on day 3 after cyclophosphamide chemotherapy. (Patient not taking: Reported on 09/06/2016), Disp: 30 tablet, Rfl: 1 .  predniSONE (DELTASONE) 20 MG tablet, Take 5 tablets (100 mg total) by mouth daily. Take on days 1-5 of chemotherapy. (Patient not taking: Reported on 09/06/2016), Disp: 5 tablet, Rfl: 0 .  Probiotic Product (PROBIOTIC PO), Take 1 capsule by mouth., Disp: , Rfl:  .  prochlorperazine (COMPAZINE) 10 MG tablet, Take 1 tablet (10 mg total) by mouth every 6 (six) hours as needed (Nausea or vomiting). (Patient not taking: Reported on 09/06/2016), Disp: 30 tablet, Rfl: 6  Physical exam: There were no vitals filed for this visit. Physical Exam  Constitutional: He is oriented to person, place, and time and well-developed, well-nourished, and in no distress.  HENT:  Head: Normocephalic and atraumatic.  Isolated mouth ulcer noted over right anterior tonsillar pillar. No thrush  Eyes: Pupils are equal, round, and reactive to light. EOM are normal.  Neck: Normal range of motion.  Cardiovascular: Normal rate, regular rhythm and normal heart sounds.   Pulmonary/Chest: Effort normal and breath sounds normal.  Abdominal: Soft. Bowel sounds are normal.  Neurological: He is alert and oriented to person, place, and time.  Skin: Skin is warm and dry.     CMP Latest Ref Rng & Units 09/13/2016  Glucose 65 - 99 mg/dL 140(H)  BUN 6 - 20 mg/dL 26(H)  Creatinine 0.61 - 1.24 mg/dL 1.25(H)  Sodium 135 - 145 mmol/L 137  Potassium 3.5 - 5.1 mmol/L 4.7  Chloride 101 - 111 mmol/L 97(L)  CO2 22 - 32 mmol/L 32  Calcium 8.9 - 10.3 mg/dL 9.0  Total Protein 6.5 - 8.1 g/dL 6.5  Total Bilirubin 0.3 - 1.2 mg/dL 1.2  Alkaline  Phos 38 - 126 U/L 95  AST 15 - 41 U/L 16  ALT 17 - 63 U/L 20   CBC Latest Ref Rng & Units 09/13/2016  WBC 3.8 - 10.6 K/uL 1.9(L)  Hemoglobin 13.0 - 18.0 g/dL 14.5  Hematocrit 40.0 - 52.0 % 41.6  Platelets 150 - 440 K/uL 133(L)    No images are attached to the encounter.  Nm Cardiac Muga Rest  Result Date: 09/05/2016 CLINICAL DATA:  Lymphoma.  Colon cancer and prostate cancer. EXAM: NUCLEAR MEDICINE CARDIAC BLOOD POOL IMAGING (MUGA) TECHNIQUE: Cardiac multi-gated acquisition was performed at rest following intravenous injection of Tc-17mlabeled red blood cells. RADIOPHARMACEUTICALS:  21.9 mCi Tc-980mDP in-vitro labeled red blood cells IV COMPARISON:  None. FINDINGS: The left ventricular ejection fraction is equal to 62.2%. Normal left ventricular wall motion. IMPRESSION: 1. Left ventricular ejection fraction is equal to 62.2%. Electronically Signed   By: TaKerby Moors.D.   On: 09/05/2016 08:21   Ct Bone Marrow Biopsy & Aspiration  Result Date: 09/01/2016 INDICATION: History of lymphoma. Please perform CT-guided biopsy for tissue diagnostic purposes. EXAM: CT-GUIDED BONE MARROW BIOPSY AND ASPIRATION MEDICATIONS: None ANESTHESIA/SEDATION: Fentanyl 75 mcg IV; Versed 2.5 mg IV Sedation Time: 15 minutes; The patient was continuously monitored during the procedure by the interventional radiology nurse under my direct supervision. COMPLICATIONS: None immediate. PROCEDURE: Informed consent was obtained from the patient following an explanation of the procedure, risks, benefits and alternatives. The patient understands, agrees and consents for the procedure. All questions were addressed. A time out was performed prior to the initiation of the procedure. The patient was positioned prone and non-contrast localization CT was performed of the pelvis to demonstrate the iliac marrow spaces. The operative site was prepped and draped in the usual sterile fashion. Under sterile conditions and local anesthesia, a  22 gauge spinal needle was utilized for procedural planning. Next, an 11 gauge coaxial bone biopsy needle was advanced into the left iliac marrow space. Needle position was confirmed with CT imaging. Initially, bone marrow aspiration was performed. Next, a bone marrow biopsy was obtained with the 11 gauge outer bone marrow device. The 11 gauge coaxial bone biopsy needle was re-advanced into a slightly different location within the left iliac marrow space, positioning was confirmed and an additional bone marrow biopsy was obtained. Samples were prepared with  the cytotechnologist and deemed adequate. The needle was removed intact. Hemostasis was obtained with compression and a dressing was placed. The patient tolerated the procedure well without immediate post procedural complication. IMPRESSION: Successful CT guided left iliac bone marrow aspiration and core biopsy. Electronically Signed   By: Simonne Come M.D.   On: 09/01/2016 10:53   Ir Fluoro Guide Port Insertion Right  Result Date: 09/01/2016 INDICATION: History of lymphoma. In need of durable intravenous access for chemotherapy administration. EXAM: IMPLANTED PORT A CATH PLACEMENT WITH ULTRASOUND AND FLUOROSCOPIC GUIDANCE COMPARISON:  PET-CT - 08/03/2016 MEDICATIONS: Ancef 2 gm IV; The antibiotic was administered within an appropriate time interval prior to skin puncture. ANESTHESIA/SEDATION: Moderate (conscious) sedation was employed during this procedure. A total of Versed 1 mg and Fentanyl 50 mcg was administered intravenously. Moderate Sedation Time: 26 minutes. The patient's level of consciousness and vital signs were monitored continuously by radiology nursing throughout the procedure under my direct supervision. CONTRAST:  None FLUOROSCOPY TIME:  30 seconds (125 mGy) COMPLICATIONS: None immediate. PROCEDURE: The procedure, risks, benefits, and alternatives were explained to the patient. Questions regarding the procedure were encouraged and answered.  The patient understands and consents to the procedure. The right neck and chest were prepped with chlorhexidine in a sterile fashion, and a sterile drape was applied covering the operative field. Maximum barrier sterile technique with sterile gowns and gloves were used for the procedure. A timeout was performed prior to the initiation of the procedure. Local anesthesia was provided with 1% lidocaine with epinephrine. After creating a small venotomy incision, a micropuncture kit was utilized to access the internal jugular vein. Real-time ultrasound guidance was utilized for vascular access including the acquisition of a permanent ultrasound image documenting patency of the accessed vessel. The microwire was utilized to measure appropriate catheter length. A subcutaneous port pocket was then created along the upper chest wall utilizing a combination of sharp and blunt dissection. The pocket was irrigated with sterile saline. A single lumen clear vue power injectable port was chosen for placement. The 8 Fr catheter was tunneled from the port pocket site to the venotomy incision. The port was placed in the pocket. The external catheter was trimmed to appropriate length. At the venotomy, an 8 Fr peel-away sheath was placed over a guidewire under fluoroscopic guidance. The catheter was then placed through the sheath and the sheath was removed. Final catheter positioning was confirmed and documented with a fluoroscopic spot radiograph. The port was accessed with a Huber needle, aspirated and flushed with heparinized saline. The venotomy site was closed with an interrupted 4-0 Vicryl suture. The port pocket incision was closed with interrupted 2-0 Vicryl suture and the skin was opposed with a running subcuticular 4-0 Vicryl suture. Dermabond and Steri-strips were applied to both incisions. Dressings were placed. The patient tolerated the procedure well without immediate post procedural complication. FINDINGS: After catheter  placement, the tip lies within the superior cavoatrial junction. The catheter aspirates and flushes normally and is ready for immediate use. IMPRESSION: Successful placement of a right internal jugular approach power injectable Port-A-Cath. The catheter is ready for immediate use. Electronically Signed   By: Simonne Come M.D.   On: 09/01/2016 15:44     Assessment and plan- Patient is a 76 y.o. male with Stage II DLBCL s/p 1 cycle of RCHOP  He has tolerated cycle 1 of chemotherapy well so far. He has moderate neutropenia today. No fever. Already received neulasta and counts should recover soon which should also  help with his mouth sore. Try oragel of chloraseptic spray for the ulcer. Call if any fever or symptoms of infection. RTC in 2 weeks with labs for cycle 2 of RCHOP with neulasta support   Visit Diagnosis 1. Diffuse large B-cell lymphoma of intra-abdominal lymph nodes (HCC)      Dr. Randa Evens, MD, MPH [facility] Pager- [phone] 09/13/2016 11:06 AM

## 2016-09-14 ENCOUNTER — Other Ambulatory Visit: Payer: Self-pay

## 2016-09-14 ENCOUNTER — Emergency Department: Payer: Medicare HMO

## 2016-09-14 ENCOUNTER — Emergency Department
Admission: EM | Admit: 2016-09-14 | Discharge: 2016-09-14 | Disposition: A | Discharge status: Home / Self Care | Payer: Medicare HMO | Attending: Emergency Medicine | Admitting: Emergency Medicine

## 2016-09-14 ENCOUNTER — Encounter: Payer: Self-pay | Admitting: Radiology

## 2016-09-14 DIAGNOSIS — C8333 Diffuse large B-cell lymphoma, intra-abdominal lymph nodes: Secondary | ICD-10-CM | POA: Diagnosis not present

## 2016-09-14 DIAGNOSIS — Z87891 Personal history of nicotine dependence: Secondary | ICD-10-CM | POA: Insufficient documentation

## 2016-09-14 DIAGNOSIS — N183 Chronic kidney disease, stage 3 (moderate): Secondary | ICD-10-CM | POA: Insufficient documentation

## 2016-09-14 DIAGNOSIS — E1122 Type 2 diabetes mellitus with diabetic chronic kidney disease: Secondary | ICD-10-CM | POA: Insufficient documentation

## 2016-09-14 DIAGNOSIS — Z85038 Personal history of other malignant neoplasm of large intestine: Secondary | ICD-10-CM | POA: Diagnosis not present

## 2016-09-14 DIAGNOSIS — R079 Chest pain, unspecified: Secondary | ICD-10-CM | POA: Diagnosis not present

## 2016-09-14 DIAGNOSIS — Z85828 Personal history of other malignant neoplasm of skin: Secondary | ICD-10-CM | POA: Diagnosis not present

## 2016-09-14 DIAGNOSIS — Z79899 Other long term (current) drug therapy: Secondary | ICD-10-CM | POA: Insufficient documentation

## 2016-09-14 DIAGNOSIS — I129 Hypertensive chronic kidney disease with stage 1 through stage 4 chronic kidney disease, or unspecified chronic kidney disease: Secondary | ICD-10-CM | POA: Diagnosis not present

## 2016-09-14 DIAGNOSIS — Z7982 Long term (current) use of aspirin: Secondary | ICD-10-CM | POA: Insufficient documentation

## 2016-09-14 DIAGNOSIS — R0789 Other chest pain: Secondary | ICD-10-CM | POA: Diagnosis not present

## 2016-09-14 LAB — COMPREHENSIVE METABOLIC PANEL
ALBUMIN: 3.6 g/dL (ref 3.5–5.0)
ALK PHOS: 89 U/L (ref 38–126)
ALT: 20 U/L (ref 17–63)
AST: 25 U/L (ref 15–41)
Anion gap: 11 (ref 5–15)
BILIRUBIN TOTAL: 1 mg/dL (ref 0.3–1.2)
BUN: 26 mg/dL — ABNORMAL HIGH (ref 6–20)
CO2: 27 mmol/L (ref 22–32)
CREATININE: 1.26 mg/dL — AB (ref 0.61–1.24)
Calcium: 8.9 mg/dL (ref 8.9–10.3)
Chloride: 97 mmol/L — ABNORMAL LOW (ref 101–111)
GFR calc Af Amer: >60 mL/min (ref >60)
GFR, EST NON AFRICAN AMERICAN: 55 mL/min — AB (ref >60)
GLUCOSE: 159 mg/dL — AB (ref 65–99)
Potassium: 3.9 mmol/L (ref 3.5–5.1)
Sodium: 135 mmol/L (ref 135–145)
TOTAL PROTEIN: 6.5 g/dL (ref 6.5–8.1)

## 2016-09-14 LAB — CBC
HEMATOCRIT: 41.4 % (ref 40.0–52.0)
HEMOGLOBIN: 14.2 g/dL (ref 13.0–18.0)
MCH: 31.6 pg (ref 26.0–34.0)
MCHC: 34.4 g/dL (ref 32.0–36.0)
MCV: 91.8 fL (ref 80.0–100.0)
Platelets: 111 10*3/uL — ABNORMAL LOW (ref 150–440)
RBC: 4.5 MIL/uL (ref 4.40–5.90)
RDW: 12.4 % (ref 11.5–14.5)
WBC: 2.6 10*3/uL — AB (ref 3.8–10.6)

## 2016-09-14 LAB — TROPONIN I: Troponin I: <0.03 ng/mL (ref <0.03)

## 2016-09-14 MED ORDER — IOPAMIDOL (ISOVUE-370) INJECTION 76%
75.0000 mL | Freq: Once | INTRAVENOUS | Status: AC | PRN
  Administered 2016-09-14: 75 mL via INTRAVENOUS

## 2016-09-14 NOTE — ED Provider Notes (Signed)
[facility] Emergency Department Provider Note    First MD Initiated Contact with Patient 09/14/16 [phone]     (approximate)  I have reviewed the triage vital signs and the nursing notes.   HISTORY  Chief Complaint Chest Pain    HPI Christian Kelly is a 76 y.o. male with below list of chronic medical conditions including Lymphoma currently receiving chemotherapy presents to the emergency department with acute onset of bilateral lower chest pain at 12:30 AM this morning. Patient denies any radiation of the pain. Patient denies any accompanying symptoms no shortness of breath no dizziness no nausea no vomiting or diaphoresis. Patient has difficulty describing the pain just stating that it is "uncomfortable". Patient's current pain score is 0 out of 10   Past Medical History:  Diagnosis Date  . Arthritis   . Basal cell carcinoma 2008   forehead  . Cancer ([place])   . Chronic renal insufficiency, stage III (moderate)   . Colon cancer ([place]) 1998  . Diabetes mellitus without complication ([place])   . History of kidney stones   . Hyperlipemia   . Hypertension   . Lyme disease   . Prostate cancer [facility]) 1999    Patient Active Problem List   Diagnosis Date Noted  . Goals of care, counseling/discussion 09/06/2016  . Diffuse large B-cell lymphoma of intra-abdominal lymph nodes ([place]) 08/23/2016  . Essential hypertension 09/04/2014  . History of colon cancer 09/04/2014  . Hyperlipidemia, unspecified 09/04/2014  . Prostate cancer ([place]) 09/04/2014  . Type 2 diabetes mellitus with renal manifestations ([place]) 09/04/2014    Past Surgical History:  Procedure Laterality Date  . COLON SURGERY  1998  . COLONOSCOPY    . COLONOSCOPY WITH PROPOFOL N/A 12/29/2014   Procedure: COLONOSCOPY WITH PROPOFOL;  Surgeon: Lollie Sails, MD;  Location: [facility] ENDOSCOPY;  Service: Endoscopy;  Laterality: N/A;  . EYE SURGERY    . FRACTURE SURGERY    . IR FLUORO GUIDE PORT INSERTION RIGHT   09/01/2016  . LITHOTRIPSY    . PROSTATECTOMY  1998  . TONSILLECTOMY      Prior to Admission medications   Medication Sig Start Date End Date Taking? Authorizing Provider  allopurinol (ZYLOPRIM) 300 MG tablet Take 1 tablet (300 mg total) by mouth daily. 08/23/16  Yes Sindy Guadeloupe, MD  aspirin EC 81 MG tablet Take 81 mg by mouth every other day.    Yes [provider]  atorvastatin (LIPITOR) 40 MG tablet Take 40 mg by mouth daily.   Yes [provider]  hydrochlorothiazide (HYDRODIURIL) 25 MG tablet Take 25 mg by mouth daily.   Yes [provider]  lidocaine-prilocaine (EMLA) cream Apply to affected area once 08/23/16  Yes Sindy Guadeloupe, MD  lisinopril (PRINIVIL,ZESTRIL) 10 MG tablet Take 10 mg by mouth daily.   Yes [provider]  LORazepam (ATIVAN) 0.5 MG tablet Take 1 tablet (0.5 mg total) by mouth every 6 (six) hours as needed (Nausea or vomiting). 08/23/16  Yes Sindy Guadeloupe, MD  Multiple Vitamin (MULTIVITAMIN) capsule Take 1 capsule by mouth daily.   Yes [provider]  omeprazole (PRILOSEC) 20 MG capsule Take 1 capsule (20 mg total) by mouth daily. 09/06/16  Yes Sindy Guadeloupe, MD  ondansetron (ZOFRAN) 8 MG tablet Take 1 tablet (8 mg total) by mouth 2 (two) times daily as needed for refractory nausea / vomiting. Start on day 3 after cyclophosphamide chemotherapy. 08/23/16  Yes Sindy Guadeloupe, MD  predniSONE (  DELTASONE) 20 MG tablet Take 5 tablets (100 mg total) by mouth daily. Take on days 1-5 of chemotherapy. 08/23/16  Yes Sindy Guadeloupe, MD  Probiotic Product (PROBIOTIC PO) Take 1 capsule by mouth.   Yes [provider]  prochlorperazine (COMPAZINE) 10 MG tablet Take 1 tablet (10 mg total) by mouth every 6 (six) hours as needed (Nausea or vomiting). 08/23/16  Yes Sindy Guadeloupe, MD    Allergies Tape  Family History  Problem Relation Age of Onset  . Coronary artery disease Mother   . Diabetes Mother   . Prostate cancer Father   .  Pancreatitis Father   . Bladder Cancer Sister   . Diabetes Brother   . Diabetes Brother   . Prostate cancer Brother   . Diabetes Brother   . Diabetes Maternal Grandmother     Social History Social History  Substance Use Topics  . Smoking status: Former Smoker    Packs/day: 1.00    Years: 8.00    Types: Cigarettes    Quit date: 08/13/1974  . Smokeless tobacco: Former Systems developer    Types: Snuff  . Alcohol use Yes     Comment: rarely-one or two beer    Review of Systems Constitutional: No fever/chills Eyes: No visual changes. ENT: No sore throat. Cardiovascular: Positive for chest pain. Respiratory: Denies shortness of breath. Gastrointestinal: No abdominal pain.  No nausea, no vomiting.  No diarrhea.  No constipation. Genitourinary: Negative for dysuria. Musculoskeletal: Negative for neck pain.  Negative for back pain. Integumentary: Negative for rash. Neurological: Negative for headaches, focal weakness or numbness.   ____________________________________________   PHYSICAL EXAM:  VITAL SIGNS: ED Triage Vitals [09/14/16 0459]  Enc Vitals Group     BP 136/79     Pulse Rate 91     Resp 18     Temp 98.2 F (36.8 C)     Temp Source Oral     SpO2 96 %     Weight 88.9 kg (196 lb)     Height 1.829 m (6')     Head Circumference      Peak Flow      Pain Score      Pain Loc      Pain Edu?      Excl. in [place]?     Constitutional: Alert and oriented. Well appearing and in no acute distress. Eyes: Conjunctivae are normal.  Head: Atraumatic. Mouth/Throat: Mucous membranes are moist.  Oropharynx non-erythematous. Neck: No stridor.  Cardiovascular: Normal rate, regular rhythm. Good peripheral circulation. Grossly normal heart sounds. Respiratory: Normal respiratory effort.  No retractions. Lungs CTAB. Gastrointestinal: Soft and nontender. No distention.  Musculoskeletal: No lower extremity tenderness nor edema. No gross deformities of extremities. Neurologic:  Normal speech  and language. No gross focal neurologic deficits are appreciated.  Skin:  Skin is warm, dry and intact. No rash noted. Psychiatric: Mood and affect are normal. Speech and behavior are normal.  ____________________________________________   LABS (all labs ordered are listed, but only abnormal results are displayed)  Labs Reviewed  CBC - Abnormal; Notable for the following:       Result Value   WBC 2.6 (*)    Platelets 111 (*)    All other components within normal limits  COMPREHENSIVE METABOLIC PANEL - Abnormal; Notable for the following:    Chloride 97 (*)    Glucose, Bld 159 (*)    BUN 26 (*)    Creatinine, Ser 1.26 (*)    GFR  calc non Af Amer 55 (*)    All other components within normal limits  TROPONIN I   ____________________________________________  EKG  ED ECG REPORT I, [place] N BROWN, the attending physician, personally viewed and interpreted this ECG.   Date: 09/14/2016  EKG Time: 4:57 AM  Rate: 85  Rhythm: Normal sinus rhythm  Axis: Normal  Intervals: Normal  ST&T Change: None  ____________________________________________  RADIOLOGY I, [place] N BROWN, personally viewed and evaluated these images (plain radiographs) as part of my medical decision making, as well as reviewing the written report by the radiologist.  Ct Angio Chest Pe W And/or Wo Contrast  Result Date: 09/14/2016 CLINICAL DATA:  Non radiating chest pain today. EXAM: CT ANGIOGRAPHY CHEST WITH CONTRAST TECHNIQUE: Multidetector CT imaging of the chest was performed using the standard protocol during bolus administration of intravenous contrast. Multiplanar CT image reconstructions and MIPs were obtained to evaluate the vascular anatomy. CONTRAST:  75 mL Isovue 370 intravenous COMPARISON:  Radiographs 09/14/2016 FINDINGS: Cardiovascular: Satisfactory opacification of the pulmonary arteries to the segmental level. No evidence of pulmonary embolism. Normal heart size. No pericardial effusion.  Mediastinum/Nodes: No enlarged mediastinal, hilar, or axillary lymph nodes. Thyroid gland, trachea, and esophagus demonstrate no significant findings. Lungs/Pleura: Lungs are clear. No pleural effusion or pneumothorax. Upper Abdomen: No acute abnormality. Musculoskeletal: No chest wall abnormality. No acute or significant osseous findings. Review of the MIP images confirms the above findings. IMPRESSION: Negative for acute pulmonary embolism. No acute findings. Electronically Signed   By: Andreas Newport M.D.   On: 09/14/2016 06:20   Dg Chest Portable 1 View  Result Date: 09/14/2016 CLINICAL DATA:  Low chest pain, non radiating. EXAM: PORTABLE CHEST 1 VIEW COMPARISON:  None. FINDINGS: Right jugular port appears satisfactorily positioned, tip in the SVC approximately 1 cm below the azygos vein junction. The lungs are clear. The pulmonary vasculature is normal. No pleural effusion. Unremarkable hilar and mediastinal contours. IMPRESSION: No active disease. Electronically Signed   By: Andreas Newport M.D.   On: 09/14/2016 06:12      Procedures   ____________________________________________   INITIAL IMPRESSION / ASSESSMENT AND PLAN / ED COURSE  Pertinent labs & imaging results that were available during my care of the patient were reviewed by me and considered in my medical decision making (see chart for details).  76 year old male presenting to the emergency department with acute onset of bilateral lower chest pain at 12:30 AM this morning. EKG revealed no evidence of ischemia or infarction laboratory data unremarkable thus far with a negative troponin. Given increased risk of pulmonary emboli CT scan of the chest was performed which was also negative for PE or any acute findings. Patient has been chest pain-free during her entire ED stay. Plan to obtain a second troponin and if negative the patient should be able to be discharged home with outpatient follow-up. Patient's care transferred to  Dr. Burlene Arnt   Clinical Course as of Sep 14 737  Wed Sep 14, 2016  0726 ----------------------------------------- 7:26 AM on @EDTODAY @ -----------------------------------------  Signedout to me by dr brown at this time. Pt to have another troponin, if negative is to be discharged.  [JM]    Clinical Course User Index [JM] Schuyler Amor, MD    ____________________________________________  FINAL CLINICAL IMPRESSION(S) / ED DIAGNOSES  Final diagnoses:  None     MEDICATIONS GIVEN DURING THIS VISIT:  Medications  iopamidol (ISOVUE-370) 76 % injection 75 mL (75 mLs Intravenous Contrast Given 09/14/16 0558)  NEW OUTPATIENT MEDICATIONS STARTED DURING THIS VISIT:  New Prescriptions   No medications on file    Modified Medications   No medications on file    Discontinued Medications   No medications on file     Note:  This document was prepared using Dragon voice recognition software and may include unintentional dictation errors.    Gregor Hams, MD 09/14/16 [phone]

## 2016-09-14 NOTE — ED Provider Notes (Signed)
-----------------------------------------   9:18 AM on 09/14/2016 -----------------------------------------  Patient resting completely with no pain. He did have some epigastric pain that went up towards his chest earlier this morning that went away when he walked around and was worse when he lay flat. He does not have that anymore. He has had 2 sets of negative cardiac markers here. He has had a negative CT scan which includes portions of his upper abdomen. He is on chemotherapy for non-Hodgkin's lymphoma. I talked to his oncologist. She feels very comfortable having him go home, and recommends discharge without admission or further workup. Patient very comfortable with this. His abdomen is completely benign to my exam at this time he has no complaints of discomfort. Extensive return precautions and follow-up given and understood.At this time, there does not appear to be clinical evidence to support the diagnosis of pulmonary embolus, dissection, myocarditis, endocarditis, pericarditis, pericardial tamponade, acute coronary syndrome, pneumothorax, pneumonia, or any other acute intrathoracic pathology that will require admission or acute intervention. Nor is there evidence of any significant intra-abdominal pathology causing this discomfort.   Schuyler Amor, MD 09/14/16 [phone]

## 2016-09-14 NOTE — Discharge Instructions (Signed)
Return to the emergency room for fever, vomiting, abdominal pain, chest pain, shortness of breath, or if you feel worse in any way. Follow closely with your oncologist, your primary care doctor, and they referred cardiologist.

## 2016-09-14 NOTE — ED Notes (Signed)
E-signature box not working. Pt verbalized understanding of discharge instructions and denied questions. 

## 2016-09-14 NOTE — ED Triage Notes (Addendum)
Pt to triage via w/c with no distress noted; currently being tx for lymphoma, portacath in place; pt reports awoke at 1230 with lower CP, nonradiating with no accomp symptoms, no hx of same; denies pain at present

## 2016-09-16 ENCOUNTER — Encounter (HOSPITAL_COMMUNITY): Payer: Self-pay

## 2016-09-19 LAB — CHROMOSOME ANALYSIS, BONE MARROW

## 2016-09-26 ENCOUNTER — Other Ambulatory Visit: Payer: Self-pay | Admitting: Oncology

## 2016-09-26 DIAGNOSIS — C8333 Diffuse large B-cell lymphoma, intra-abdominal lymph nodes: Secondary | ICD-10-CM

## 2016-09-26 NOTE — Progress Notes (Signed)
Hematology/Oncology Consult note [facility]  Telephone:([phone] [phone]  Patient Care Team: Ezequiel Kayser, MD as PCP - General (Internal Medicine) Hollice Espy, MD as Consulting Physician (Urology)   Name of the patient: Christian Kelly  [id number]  1940-05-10   Date of visit: 09/26/16  Diagnosis- 1. 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 Stage II DLBCL GCB. FISH for double hit negative  Chief complaint/ Reason for visit- assess tolerance to cycle 1 of RCHOP  Heme/Onc history:1. Patient is a 76 yr old male with a h/o prostate cancer diagnosed in 1999s/p radical prostatectomy. Prior to that he had colon cancer in 1998 s/o surgery and adjuvant chemotherapy in 1998. With regards to prostate cancer- surgery was done at [facility] in [place] and he was followed at [place] subsequently. Patient think his Gleasons score was 7 at diagnosis. He has not required any radiation therapy or ADT so far. Patient still spends 4-5 months at [place].   2. Dr. Raechel Ache has been monitoring his PSArecently and his recent trend of PSA has been as follows: psa was 0.33 in feb 2017 and 0.63 in April 2018. We have 1 psa from 2015 when it was 0.3  3. Patient was referred to Dr. Erlene Quan urology and Dr. Baruch Gouty for salvage radiation.   4. Dr. Erlene Quan ordered CT abdomen which showed: IMPRESSION: 1. Status post prostatectomy, without locally recurrent disease. 2. Extensive abdominal adenopathy. Given the clinical history, most likely related to metastatic prostate carcinoma. Lymphoma could look similar. 3. Coronary artery atherosclerosis. Aortic atherosclerosis. 4. Vague upper sacral sclerosis is felt unlikely to represent metastatic disease, given lack of correlate on yesterday's bone scan. Correlate with radiation therapy, as radiation induced necrosis could have this appearance. Recommend attention on follow-up.  5.  This was followed by PET/CT scan which showed: IMPRESSION: 1. Hypermetabolic gastrohepatic ligament, abdominal retroperitoneal and small bowel mesenteric adenopathy, most indicative of lymphoma. 2. Aortic atherosclerosis (ICD10-170.0). Coronary artery Calcification.  6. Patient underwent CT-guided biopsy of the mesenteric lymph node which showed:DIAGNOSIS:  A. LYMPH NODE, MESENTERIC; CT-GUIDED CORE BIOPSY:  - LARGE B-CELL LYMPHOMA, CD10 POSITIVE.   Comment:  There is a diffuse proliferation of large lymphocytes with irregular  nuclear contours, inconspicuous nucleoli, and scant cytoplasm. The flow  cytometry and IHC results are most consistent with diffuse large B-cell  lymphoma, not otherwise specified, germinal center B-cell type. However,  it is likely that [place] for MYC, BCL2 and BCL6 gene rearrangements will  be ordered, so final classification will be based on the [place] result  7. Bone marrow biopsy was negative for lymphoma  8. 17% of nuclei were positive for BCL-2 rearrangement. 35% of nuclei had extra myc signals but no gene rearrangement for cmyc noted. This was consistent with evolved lymphoma or DLBCL. Not consistent with with double hit or double expressor   Interval history- doing well. Denies any complaints today. No fever. He went to [place] to see his family and was active that time.   ECOG PS- 0 Pain scale- 0  Review of systems- Review of Systems  Constitutional: Negative for chills, fever, malaise/fatigue and weight loss.  HENT: Negative for congestion, ear discharge and nosebleeds.   Eyes: Negative for blurred vision.  Respiratory: Negative for cough, hemoptysis, sputum production, shortness of breath and wheezing.   Cardiovascular: Negative for chest pain, palpitations, orthopnea and claudication.  Gastrointestinal: Negative for abdominal pain, blood in stool, constipation, diarrhea, heartburn, melena, nausea and vomiting.  Genitourinary: Negative  for dysuria,  flank pain, frequency, hematuria and urgency.  Musculoskeletal: Negative for back pain, joint pain and myalgias.  Skin: Negative for rash.  Neurological: Negative for dizziness, tingling, focal weakness, seizures, weakness and headaches.  Endo/Heme/Allergies: Does not bruise/bleed easily.  Psychiatric/Behavioral: Negative for depression and suicidal ideas. The patient does not have insomnia.       Allergies  Allergen Reactions  . Tape Rash     Past Medical History:  Diagnosis Date  . Arthritis   . Basal cell carcinoma 2008   forehead  . Cancer ([place])   . Chronic renal insufficiency, stage III (moderate)   . Colon cancer ([place]) 1998  . Diabetes mellitus without complication ([place])   . History of kidney stones   . Hyperlipemia   . Hypertension   . Lyme disease   . Prostate cancer ([place]) 1999     Past Surgical History:  Procedure Laterality Date  . COLON SURGERY  1998  . COLONOSCOPY    . COLONOSCOPY WITH PROPOFOL N/A 12/29/2014   Procedure: COLONOSCOPY WITH PROPOFOL;  Surgeon: Lollie Sails, MD;  Location: [facility] ENDOSCOPY;  Service: Endoscopy;  Laterality: N/A;  . EYE SURGERY    . FRACTURE SURGERY    . IR FLUORO GUIDE PORT INSERTION RIGHT  09/01/2016  . LITHOTRIPSY    . PROSTATECTOMY  1998  . TONSILLECTOMY      Social History   Social History  . Marital status: Married    Spouse name: N/A  . Number of children: N/A  . Years of education: N/A   Occupational History  . Not on file.   Social History Main Topics  . Smoking status: Former Smoker    Packs/day: 1.00    Years: 8.00    Types: Cigarettes    Quit date: 08/13/1974  . Smokeless tobacco: Former Systems developer    Types: Snuff  . Alcohol use Yes     Comment: rarely-one or two beer  . Drug use: No  . Sexual activity: Not Currently   Other Topics Concern  . Not on file   Social History Narrative  . No narrative on file    Family History  Problem Relation Age of Onset  . Coronary artery disease Mother   .  Diabetes Mother   . Prostate cancer Father   . Pancreatitis Father   . Bladder Cancer Sister   . Diabetes Brother   . Diabetes Brother   . Prostate cancer Brother   . Diabetes Brother   . Diabetes Maternal Grandmother      Current Outpatient Prescriptions:  .  allopurinol (ZYLOPRIM) 300 MG tablet, Take 1 tablet (300 mg total) by mouth daily., Disp: 30 tablet, Rfl: 3 .  aspirin EC 81 MG tablet, Take 81 mg by mouth every other day. , Disp: , Rfl:  .  atorvastatin (LIPITOR) 40 MG tablet, Take 40 mg by mouth daily., Disp: , Rfl:  .  hydrochlorothiazide (HYDRODIURIL) 25 MG tablet, Take 25 mg by mouth daily., Disp: , Rfl:  .  lidocaine-prilocaine (EMLA) cream, Apply to affected area once, Disp: 30 g, Rfl: 3 .  lisinopril (PRINIVIL,ZESTRIL) 10 MG tablet, Take 10 mg by mouth daily., Disp: , Rfl:  .  LORazepam (ATIVAN) 0.5 MG tablet, Take 1 tablet (0.5 mg total) by mouth every 6 (six) hours as needed (Nausea or vomiting)., Disp: 30 tablet, Rfl: 0 .  Multiple Vitamin (MULTIVITAMIN) capsule, Take 1 capsule by mouth daily., Disp: , Rfl:  .  omeprazole (PRILOSEC) 20 MG  capsule, Take 1 capsule (20 mg total) by mouth daily., Disp: 30 capsule, Rfl: 1 .  ondansetron (ZOFRAN) 8 MG tablet, Take 1 tablet (8 mg total) by mouth 2 (two) times daily as needed for refractory nausea / vomiting. Start on day 3 after cyclophosphamide chemotherapy., Disp: 30 tablet, Rfl: 1 .  predniSONE (DELTASONE) 20 MG tablet, Take 5 tablets (100 mg total) by mouth daily. Take on days 1-5 of chemotherapy., Disp: 5 tablet, Rfl: 0 .  Probiotic Product (PROBIOTIC PO), Take 1 capsule by mouth., Disp: , Rfl:  .  prochlorperazine (COMPAZINE) 10 MG tablet, Take 1 tablet (10 mg total) by mouth every 6 (six) hours as needed (Nausea or vomiting)., Disp: 30 tablet, Rfl: 6  Physical exam:  Vitals:   09/27/16 0907  BP: 126/76  Pulse: 76  Resp: 18  Temp: (!) 96.4 F (35.8 C)  TempSrc: Tympanic  Weight: 194 lb 9.6 oz (88.3 kg)  Height:  6' (1.829 m)   Physical Exam  Constitutional: He is oriented to person, place, and time and well-developed, well-nourished, and in no distress.  HENT:  Head: Normocephalic and atraumatic.  Eyes: Pupils are equal, round, and reactive to light. EOM are normal.  Neck: Normal range of motion.  Cardiovascular: Normal rate, regular rhythm and normal heart sounds.   Pulmonary/Chest: Effort normal and breath sounds normal.  Abdominal: Soft. Bowel sounds are normal.  Neurological: He is alert and oriented to person, place, and time.  Skin: Skin is warm and dry.     CMP Latest Ref Rng & Units 09/14/2016  Glucose 65 - 99 mg/dL 159(H)  BUN 6 - 20 mg/dL 26(H)  Creatinine 0.61 - 1.24 mg/dL 1.26(H)  Sodium 135 - 145 mmol/L 135  Potassium 3.5 - 5.1 mmol/L 3.9  Chloride 101 - 111 mmol/L 97(L)  CO2 22 - 32 mmol/L 27  Calcium 8.9 - 10.3 mg/dL 8.9  Total Protein 6.5 - 8.1 g/dL 6.5  Total Bilirubin 0.3 - 1.2 mg/dL 1.0  Alkaline Phos 38 - 126 U/L 89  AST 15 - 41 U/L 25  ALT 17 - 63 U/L 20   CBC Latest Ref Rng & Units 09/14/2016  WBC 3.8 - 10.6 K/uL 2.6(L)  Hemoglobin 13.0 - 18.0 g/dL 14.2  Hematocrit 40.0 - 52.0 % 41.4  Platelets 150 - 440 K/uL 111(L)    No images are attached to the encounter.  Ct Angio Chest Pe W And/or Wo Contrast  Result Date: 09/14/2016 CLINICAL DATA:  Non radiating chest pain today. EXAM: CT ANGIOGRAPHY CHEST WITH CONTRAST TECHNIQUE: Multidetector CT imaging of the chest was performed using the standard protocol during bolus administration of intravenous contrast. Multiplanar CT image reconstructions and MIPs were obtained to evaluate the vascular anatomy. CONTRAST:  75 mL Isovue 370 intravenous COMPARISON:  Radiographs 09/14/2016 FINDINGS: Cardiovascular: Satisfactory opacification of the pulmonary arteries to the segmental level. No evidence of pulmonary embolism. Normal heart size. No pericardial effusion. Mediastinum/Nodes: No enlarged mediastinal, hilar, or axillary lymph  nodes. Thyroid gland, trachea, and esophagus demonstrate no significant findings. Lungs/Pleura: Lungs are clear. No pleural effusion or pneumothorax. Upper Abdomen: No acute abnormality. Musculoskeletal: No chest wall abnormality. No acute or significant osseous findings. Review of the MIP images confirms the above findings. IMPRESSION: Negative for acute pulmonary embolism. No acute findings. Electronically Signed   By: Andreas Newport M.D.   On: 09/14/2016 06:20   Nm Cardiac Muga Rest  Result Date: 09/05/2016 CLINICAL DATA:  Lymphoma.  Colon cancer and prostate cancer. EXAM:  NUCLEAR MEDICINE CARDIAC BLOOD POOL IMAGING (MUGA) TECHNIQUE: Cardiac multi-gated acquisition was performed at rest following intravenous injection of Tc-72mlabeled red blood cells. RADIOPHARMACEUTICALS:  21.9 mCi Tc-92mDP in-vitro labeled red blood cells IV COMPARISON:  None. FINDINGS: The left ventricular ejection fraction is equal to 62.2%. Normal left ventricular wall motion. IMPRESSION: 1. Left ventricular ejection fraction is equal to 62.2%. Electronically Signed   By: TaKerby Moors.D.   On: 09/05/2016 08:21   Dg Chest Portable 1 View  Result Date: 09/14/2016 CLINICAL DATA:  Low chest pain, non radiating. EXAM: PORTABLE CHEST 1 VIEW COMPARISON:  None. FINDINGS: Right jugular port appears satisfactorily positioned, tip in the SVC approximately 1 cm below the azygos vein junction. The lungs are clear. The pulmonary vasculature is normal. No pleural effusion. Unremarkable hilar and mediastinal contours. IMPRESSION: No active disease. Electronically Signed   By: DaAndreas Newport.D.   On: 09/14/2016 06:12   Ct Bone Marrow Biopsy & Aspiration  Result Date: 09/01/2016 INDICATION: History of lymphoma. Please perform CT-guided biopsy for tissue diagnostic purposes. EXAM: CT-GUIDED BONE MARROW BIOPSY AND ASPIRATION MEDICATIONS: None ANESTHESIA/SEDATION: Fentanyl 75 mcg IV; Versed 2.5 mg IV Sedation Time: 15 minutes; The  patient was continuously monitored during the procedure by the interventional radiology nurse under my direct supervision. COMPLICATIONS: None immediate. PROCEDURE: Informed consent was obtained from the patient following an explanation of the procedure, risks, benefits and alternatives. The patient understands, agrees and consents for the procedure. All questions were addressed. A time out was performed prior to the initiation of the procedure. The patient was positioned prone and non-contrast localization CT was performed of the pelvis to demonstrate the iliac marrow spaces. The operative site was prepped and draped in the usual sterile fashion. Under sterile conditions and local anesthesia, a 22 gauge spinal needle was utilized for procedural planning. Next, an 11 gauge coaxial bone biopsy needle was advanced into the left iliac marrow space. Needle position was confirmed with CT imaging. Initially, bone marrow aspiration was performed. Next, a bone marrow biopsy was obtained with the 11 gauge outer bone marrow device. The 11 gauge coaxial bone biopsy needle was re-advanced into a slightly different location within the left iliac marrow space, positioning was confirmed and an additional bone marrow biopsy was obtained. Samples were prepared with the cytotechnologist and deemed adequate. The needle was removed intact. Hemostasis was obtained with compression and a dressing was placed. The patient tolerated the procedure well without immediate post procedural complication. IMPRESSION: Successful CT guided left iliac bone marrow aspiration and core biopsy. Electronically Signed   By: JoSandi Mariscal.D.   On: 09/01/2016 10:53   Ir Fluoro Guide Port Insertion Right  Result Date: 09/01/2016 INDICATION: History of lymphoma. In need of durable intravenous access for chemotherapy administration. EXAM: IMPLANTED PORT A CATH PLACEMENT WITH ULTRASOUND AND FLUOROSCOPIC GUIDANCE COMPARISON:  PET-CT - 08/03/2016 MEDICATIONS:  Ancef 2 gm IV; The antibiotic was administered within an appropriate time interval prior to skin puncture. ANESTHESIA/SEDATION: Moderate (conscious) sedation was employed during this procedure. A total of Versed 1 mg and Fentanyl 50 mcg was administered intravenously. Moderate Sedation Time: 26 minutes. The patient's level of consciousness and vital signs were monitored continuously by radiology nursing throughout the procedure under my direct supervision. CONTRAST:  None FLUOROSCOPY TIME:  30 seconds (12425Gy) COMPLICATIONS: None immediate. PROCEDURE: The procedure, risks, benefits, and alternatives were explained to the patient. Questions regarding the procedure were encouraged and answered. The patient understands and consents to  the procedure. The right neck and chest were prepped with chlorhexidine in a sterile fashion, and a sterile drape was applied covering the operative field. Maximum barrier sterile technique with sterile gowns and gloves were used for the procedure. A timeout was performed prior to the initiation of the procedure. Local anesthesia was provided with 1% lidocaine with epinephrine. After creating a small venotomy incision, a micropuncture kit was utilized to access the internal jugular vein. Real-time ultrasound guidance was utilized for vascular access including the acquisition of a permanent ultrasound image documenting patency of the accessed vessel. The microwire was utilized to measure appropriate catheter length. A subcutaneous port pocket was then created along the upper chest wall utilizing a combination of sharp and blunt dissection. The pocket was irrigated with sterile saline. A single lumen clear vue power injectable port was chosen for placement. The 8 Fr catheter was tunneled from the port pocket site to the venotomy incision. The port was placed in the pocket. The external catheter was trimmed to appropriate length. At the venotomy, an 8 Fr peel-away sheath was placed over a  guidewire under fluoroscopic guidance. The catheter was then placed through the sheath and the sheath was removed. Final catheter positioning was confirmed and documented with a fluoroscopic spot radiograph. The port was accessed with a Huber needle, aspirated and flushed with heparinized saline. The venotomy site was closed with an interrupted 4-0 Vicryl suture. The port pocket incision was closed with interrupted 2-0 Vicryl suture and the skin was opposed with a running subcuticular 4-0 Vicryl suture. Dermabond and Steri-strips were applied to both incisions. Dressings were placed. The patient tolerated the procedure well without immediate post procedural complication. FINDINGS: After catheter placement, the tip lies within the superior cavoatrial junction. The catheter aspirates and flushes normally and is ready for immediate use. IMPRESSION: Successful placement of a right internal jugular approach power injectable Port-A-Cath. The catheter is ready for immediate use. Electronically Signed   By: Sandi Mariscal M.D.   On: 09/01/2016 15:44     Assessment and plan- Patient is a 76 y.o. male with Stage II DLBCL here for on treatment assessment prior to cycle 2 of RCHOP  Counts ok to proceed with cycle 2 of RCHOP today with neulasta support. RTC in 3 weeks with cbc, cmp for cycle 3 of RCHOP. Will get interim scans after 3 cycles  Discussed FISH testing results which do not reveal double hit lymphoma. Ok to continue with RCHOP  He will call us in the interim fi theer are any questions or concerns   Visit Diagnosis 1. Diffuse large B-cell lymphoma of intra-abdominal lymph nodes ([place])   2. Encounter for antineoplastic chemotherapy   3. Encounter for monitoring rituximab therapy      Dr. Randa Evens, MD, MPH [facility] Pager- [phone] 09/27/2016 9:27 AM

## 2016-09-27 ENCOUNTER — Inpatient Hospital Stay: Payer: Medicare HMO | Attending: Oncology | Admitting: Oncology

## 2016-09-27 ENCOUNTER — Telehealth: Payer: Self-pay | Admitting: Oncology

## 2016-09-27 ENCOUNTER — Inpatient Hospital Stay: Payer: Medicare HMO

## 2016-09-27 ENCOUNTER — Encounter: Payer: Self-pay | Admitting: Oncology

## 2016-09-27 ENCOUNTER — Other Ambulatory Visit: Payer: Self-pay | Admitting: *Deleted

## 2016-09-27 ENCOUNTER — Other Ambulatory Visit: Payer: Self-pay | Admitting: Oncology

## 2016-09-27 VITALS — BP 126/76 | HR 76 | Temp 96.4°F | Resp 18 | Ht 72.0 in | Wt 194.6 lb

## 2016-09-27 DIAGNOSIS — Z85038 Personal history of other malignant neoplasm of large intestine: Secondary | ICD-10-CM | POA: Insufficient documentation

## 2016-09-27 DIAGNOSIS — Z79899 Other long term (current) drug therapy: Secondary | ICD-10-CM | POA: Diagnosis not present

## 2016-09-27 DIAGNOSIS — E119 Type 2 diabetes mellitus without complications: Secondary | ICD-10-CM | POA: Diagnosis not present

## 2016-09-27 DIAGNOSIS — Z5111 Encounter for antineoplastic chemotherapy: Secondary | ICD-10-CM | POA: Insufficient documentation

## 2016-09-27 DIAGNOSIS — C7989 Secondary malignant neoplasm of other specified sites: Secondary | ICD-10-CM

## 2016-09-27 DIAGNOSIS — Z9221 Personal history of antineoplastic chemotherapy: Secondary | ICD-10-CM | POA: Diagnosis not present

## 2016-09-27 DIAGNOSIS — C8333 Diffuse large B-cell lymphoma, intra-abdominal lymph nodes: Secondary | ICD-10-CM | POA: Diagnosis not present

## 2016-09-27 DIAGNOSIS — E785 Hyperlipidemia, unspecified: Secondary | ICD-10-CM | POA: Insufficient documentation

## 2016-09-27 DIAGNOSIS — Z87891 Personal history of nicotine dependence: Secondary | ICD-10-CM | POA: Insufficient documentation

## 2016-09-27 DIAGNOSIS — N183 Chronic kidney disease, stage 3 (moderate): Secondary | ICD-10-CM | POA: Insufficient documentation

## 2016-09-27 DIAGNOSIS — Z7689 Persons encountering health services in other specified circumstances: Secondary | ICD-10-CM | POA: Insufficient documentation

## 2016-09-27 DIAGNOSIS — I129 Hypertensive chronic kidney disease with stage 1 through stage 4 chronic kidney disease, or unspecified chronic kidney disease: Secondary | ICD-10-CM | POA: Insufficient documentation

## 2016-09-27 DIAGNOSIS — Z7982 Long term (current) use of aspirin: Secondary | ICD-10-CM | POA: Insufficient documentation

## 2016-09-27 DIAGNOSIS — Z87442 Personal history of urinary calculi: Secondary | ICD-10-CM | POA: Insufficient documentation

## 2016-09-27 DIAGNOSIS — I2510 Atherosclerotic heart disease of native coronary artery without angina pectoris: Secondary | ICD-10-CM | POA: Insufficient documentation

## 2016-09-27 DIAGNOSIS — C61 Malignant neoplasm of prostate: Secondary | ICD-10-CM | POA: Insufficient documentation

## 2016-09-27 DIAGNOSIS — Z85828 Personal history of other malignant neoplasm of skin: Secondary | ICD-10-CM | POA: Diagnosis not present

## 2016-09-27 DIAGNOSIS — Z5181 Encounter for therapeutic drug level monitoring: Secondary | ICD-10-CM

## 2016-09-27 DIAGNOSIS — Z79620 Long term (current) use of immunosuppressive biologic: Secondary | ICD-10-CM

## 2016-09-27 DIAGNOSIS — Z5112 Encounter for antineoplastic immunotherapy: Secondary | ICD-10-CM | POA: Diagnosis not present

## 2016-09-27 LAB — COMPREHENSIVE METABOLIC PANEL
ALT: 22 U/L (ref 17–63)
ANION GAP: 9 (ref 5–15)
AST: 26 U/L (ref 15–41)
Albumin: 3.7 g/dL (ref 3.5–5.0)
Alkaline Phosphatase: 98 U/L (ref 38–126)
BILIRUBIN TOTAL: 0.6 mg/dL (ref 0.3–1.2)
BUN: 24 mg/dL — AB (ref 6–20)
CO2: 26 mmol/L (ref 22–32)
Calcium: 9.1 mg/dL (ref 8.9–10.3)
Chloride: 102 mmol/L (ref 101–111)
Creatinine, Ser: 1.1 mg/dL (ref 0.61–1.24)
Glucose, Bld: 224 mg/dL — ABNORMAL HIGH (ref 65–99)
POTASSIUM: 4 mmol/L (ref 3.5–5.1)
Sodium: 137 mmol/L (ref 135–145)
TOTAL PROTEIN: 6.6 g/dL (ref 6.5–8.1)

## 2016-09-27 LAB — CBC WITH DIFFERENTIAL/PLATELET
Basophils Absolute: 0.1 10*3/uL (ref 0–0.1)
Basophils Relative: 1 %
EOS PCT: 1 %
Eosinophils Absolute: 0.1 10*3/uL (ref 0–0.7)
HEMATOCRIT: 37.8 % — AB (ref 40.0–52.0)
Hemoglobin: 13.5 g/dL (ref 13.0–18.0)
LYMPHS PCT: 9 %
Lymphs Abs: 0.9 10*3/uL — ABNORMAL LOW (ref 1.0–3.6)
MCH: 32.3 pg (ref 26.0–34.0)
MCHC: 35.8 g/dL (ref 32.0–36.0)
MCV: 90.1 fL (ref 80.0–100.0)
MONO ABS: 0.9 10*3/uL (ref 0.2–1.0)
MONOS PCT: 9 %
NEUTROS ABS: 8 10*3/uL — AB (ref 1.4–6.5)
Neutrophils Relative %: 80 %
Platelets: 305 10*3/uL (ref 150–440)
RBC: 4.2 MIL/uL — ABNORMAL LOW (ref 4.40–5.90)
RDW: 12.6 % (ref 11.5–14.5)
WBC: 9.9 10*3/uL (ref 3.8–10.6)

## 2016-09-27 LAB — LACTATE DEHYDROGENASE: LDH: 130 U/L (ref 98–192)

## 2016-09-27 MED ORDER — SODIUM CHLORIDE 0.9% FLUSH
10.0000 mL | INTRAVENOUS | Status: DC | PRN
  Filled 2016-09-27: qty 10

## 2016-09-27 MED ORDER — SODIUM CHLORIDE 0.9 % IV SOLN
Freq: Once | INTRAVENOUS | Status: AC
  Administered 2016-09-27: 10:00:00 via INTRAVENOUS
  Filled 2016-09-27: qty 1000

## 2016-09-27 MED ORDER — ACETAMINOPHEN 325 MG PO TABS
650.0000 mg | ORAL_TABLET | Freq: Once | ORAL | Status: AC
  Administered 2016-09-27: 650 mg via ORAL
  Filled 2016-09-27: qty 2

## 2016-09-27 MED ORDER — SODIUM CHLORIDE 0.9 % IV SOLN
2.0000 mg | Freq: Once | INTRAVENOUS | Status: AC
  Administered 2016-09-27: 2 mg via INTRAVENOUS
  Filled 2016-09-27: qty 2

## 2016-09-27 MED ORDER — DEXAMETHASONE SODIUM PHOSPHATE 10 MG/ML IJ SOLN
10.0000 mg | Freq: Once | INTRAMUSCULAR | Status: AC
  Administered 2016-09-27: 10 mg via INTRAVENOUS
  Filled 2016-09-27: qty 1

## 2016-09-27 MED ORDER — HEPARIN SOD (PORK) LOCK FLUSH 100 UNIT/ML IV SOLN
500.0000 [IU] | Freq: Once | INTRAVENOUS | Status: AC
  Administered 2016-09-27: 500 [IU] via INTRAVENOUS

## 2016-09-27 MED ORDER — PALONOSETRON HCL INJECTION 0.25 MG/5ML
0.2500 mg | Freq: Once | INTRAVENOUS | Status: AC
  Administered 2016-09-27: 0.25 mg via INTRAVENOUS
  Filled 2016-09-27: qty 5

## 2016-09-27 MED ORDER — DOXORUBICIN HCL CHEMO IV INJECTION 2 MG/ML
100.0000 mg | Freq: Once | INTRAVENOUS | Status: AC
  Administered 2016-09-27: 100 mg via INTRAVENOUS
  Filled 2016-09-27: qty 50

## 2016-09-27 MED ORDER — SODIUM CHLORIDE 0.9 % IV SOLN: 375.0000 mg/m2 | Freq: Once | INTRAVENOUS | Status: DC

## 2016-09-27 MED ORDER — SODIUM CHLORIDE 0.9 % IV SOLN
375.0000 mg/m2 | Freq: Once | INTRAVENOUS | Status: AC
  Administered 2016-09-27: 800 mg via INTRAVENOUS
  Filled 2016-09-27: qty 50

## 2016-09-27 MED ORDER — SODIUM CHLORIDE 0.9 % IV SOLN
750.0000 mg/m2 | Freq: Once | INTRAVENOUS | Status: AC
  Administered 2016-09-27: 1600 mg via INTRAVENOUS
  Filled 2016-09-27: qty 80

## 2016-09-27 MED ORDER — PEGFILGRASTIM 6 MG/0.6ML ~~LOC~~ PSKT
6.0000 mg | PREFILLED_SYRINGE | Freq: Once | SUBCUTANEOUS | Status: AC
  Administered 2016-09-27: 6 mg via SUBCUTANEOUS
  Filled 2016-09-27: qty 0.6

## 2016-09-27 MED ORDER — SODIUM CHLORIDE 0.9% FLUSH
10.0000 mL | INTRAVENOUS | Status: DC | PRN
  Administered 2016-09-27: 10 mL via INTRAVENOUS
  Filled 2016-09-27: qty 10

## 2016-09-27 MED ORDER — PREDNISONE 50 MG PO TABS: 100.0000 mg | ORAL_TABLET | Freq: Every day | ORAL | 0 refills | Status: DC

## 2016-09-27 MED ORDER — DIPHENHYDRAMINE HCL 25 MG PO CAPS
50.0000 mg | ORAL_CAPSULE | Freq: Once | ORAL | Status: AC
  Administered 2016-09-27: 50 mg via ORAL
  Filled 2016-09-27: qty 2

## 2016-09-27 MED ORDER — HEPARIN SOD (PORK) LOCK FLUSH 100 UNIT/ML IV SOLN
500.0000 [IU] | Freq: Once | INTRAVENOUS | Status: DC | PRN
  Filled 2016-09-27: qty 5

## 2016-09-27 NOTE — Progress Notes (Signed)
Pt here for next treatment. He is eating more healthier- less carbs and more protein and vegetables, appetite is good.

## 2016-09-27 NOTE — Telephone Encounter (Signed)
Lab/MD/R-CHOP  with NEULASTA ONPRO KIT, per 09/27/16 los. MF Dbl book @ 8:30am, per Judeen Hammans (verbal).

## 2016-10-14 ENCOUNTER — Ambulatory Visit: Payer: Medicare HMO | Admitting: Urology

## 2016-10-18 ENCOUNTER — Inpatient Hospital Stay: Payer: Medicare HMO

## 2016-10-18 ENCOUNTER — Inpatient Hospital Stay (HOSPITAL_BASED_OUTPATIENT_CLINIC_OR_DEPARTMENT_OTHER): Payer: Medicare HMO | Admitting: Oncology

## 2016-10-18 ENCOUNTER — Encounter: Payer: Self-pay | Admitting: Oncology

## 2016-10-18 VITALS — BP 112/67 | HR 85 | Temp 96.8°F | Resp 18 | Ht 72.0 in | Wt 191.9 lb

## 2016-10-18 DIAGNOSIS — Z79899 Other long term (current) drug therapy: Secondary | ICD-10-CM

## 2016-10-18 DIAGNOSIS — N183 Chronic kidney disease, stage 3 (moderate): Secondary | ICD-10-CM | POA: Diagnosis not present

## 2016-10-18 DIAGNOSIS — Z7689 Persons encountering health services in other specified circumstances: Secondary | ICD-10-CM | POA: Diagnosis not present

## 2016-10-18 DIAGNOSIS — I2510 Atherosclerotic heart disease of native coronary artery without angina pectoris: Secondary | ICD-10-CM | POA: Diagnosis not present

## 2016-10-18 DIAGNOSIS — Z9221 Personal history of antineoplastic chemotherapy: Secondary | ICD-10-CM

## 2016-10-18 DIAGNOSIS — E785 Hyperlipidemia, unspecified: Secondary | ICD-10-CM | POA: Diagnosis not present

## 2016-10-18 DIAGNOSIS — Z5112 Encounter for antineoplastic immunotherapy: Secondary | ICD-10-CM

## 2016-10-18 DIAGNOSIS — E119 Type 2 diabetes mellitus without complications: Secondary | ICD-10-CM

## 2016-10-18 DIAGNOSIS — C8333 Diffuse large B-cell lymphoma, intra-abdominal lymph nodes: Secondary | ICD-10-CM | POA: Diagnosis not present

## 2016-10-18 DIAGNOSIS — I129 Hypertensive chronic kidney disease with stage 1 through stage 4 chronic kidney disease, or unspecified chronic kidney disease: Secondary | ICD-10-CM

## 2016-10-18 DIAGNOSIS — Z87442 Personal history of urinary calculi: Secondary | ICD-10-CM | POA: Diagnosis not present

## 2016-10-18 DIAGNOSIS — Z85828 Personal history of other malignant neoplasm of skin: Secondary | ICD-10-CM

## 2016-10-18 DIAGNOSIS — C61 Malignant neoplasm of prostate: Secondary | ICD-10-CM | POA: Diagnosis not present

## 2016-10-18 DIAGNOSIS — Z7982 Long term (current) use of aspirin: Secondary | ICD-10-CM

## 2016-10-18 DIAGNOSIS — Z87891 Personal history of nicotine dependence: Secondary | ICD-10-CM

## 2016-10-18 DIAGNOSIS — Z85038 Personal history of other malignant neoplasm of large intestine: Secondary | ICD-10-CM | POA: Diagnosis not present

## 2016-10-18 DIAGNOSIS — Z5111 Encounter for antineoplastic chemotherapy: Secondary | ICD-10-CM | POA: Diagnosis not present

## 2016-10-18 LAB — COMPREHENSIVE METABOLIC PANEL
ALBUMIN: 3.5 g/dL (ref 3.5–5.0)
ALT: 22 U/L (ref 17–63)
ANION GAP: 9 (ref 5–15)
AST: 23 U/L (ref 15–41)
Alkaline Phosphatase: 86 U/L (ref 38–126)
BUN: 23 mg/dL — ABNORMAL HIGH (ref 6–20)
CHLORIDE: 99 mmol/L — AB (ref 101–111)
CO2: 28 mmol/L (ref 22–32)
Calcium: 9.1 mg/dL (ref 8.9–10.3)
Creatinine, Ser: 1.23 mg/dL (ref 0.61–1.24)
GFR calc Af Amer: >60 mL/min (ref >60)
GFR calc non Af Amer: 56 mL/min — ABNORMAL LOW (ref >60)
GLUCOSE: 185 mg/dL — AB (ref 65–99)
POTASSIUM: 3.8 mmol/L (ref 3.5–5.1)
SODIUM: 136 mmol/L (ref 135–145)
TOTAL PROTEIN: 6.7 g/dL (ref 6.5–8.1)
Total Bilirubin: 0.6 mg/dL (ref 0.3–1.2)

## 2016-10-18 LAB — CBC WITH DIFFERENTIAL/PLATELET
BASOS ABS: 0.1 10*3/uL (ref 0–0.1)
BASOS PCT: 1 %
EOS ABS: 0.1 10*3/uL (ref 0–0.7)
Eosinophils Relative: 1 %
HCT: 33.1 % — ABNORMAL LOW (ref 40.0–52.0)
Hemoglobin: 11.8 g/dL — ABNORMAL LOW (ref 13.0–18.0)
Lymphocytes Relative: 5 %
Lymphs Abs: 0.6 10*3/uL — ABNORMAL LOW (ref 1.0–3.6)
MCH: 32.1 pg (ref 26.0–34.0)
MCHC: 35.6 g/dL (ref 32.0–36.0)
MCV: 90.2 fL (ref 80.0–100.0)
MONO ABS: 1.5 10*3/uL — AB (ref 0.2–1.0)
MONOS PCT: 14 %
Neutro Abs: 8.5 10*3/uL — ABNORMAL HIGH (ref 1.4–6.5)
Neutrophils Relative %: 79 %
PLATELETS: 306 10*3/uL (ref 150–440)
RBC: 3.67 MIL/uL — ABNORMAL LOW (ref 4.40–5.90)
RDW: 13.6 % (ref 11.5–14.5)
WBC: 10.8 10*3/uL — ABNORMAL HIGH (ref 3.8–10.6)

## 2016-10-18 MED ORDER — DEXAMETHASONE SODIUM PHOSPHATE 10 MG/ML IJ SOLN
10.0000 mg | Freq: Once | INTRAMUSCULAR | Status: AC
  Administered 2016-10-18: 10 mg via INTRAVENOUS
  Filled 2016-10-18: qty 1

## 2016-10-18 MED ORDER — ACETAMINOPHEN 325 MG PO TABS
650.0000 mg | ORAL_TABLET | Freq: Once | ORAL | Status: AC
  Administered 2016-10-18: 650 mg via ORAL
  Filled 2016-10-18: qty 2

## 2016-10-18 MED ORDER — PALONOSETRON HCL INJECTION 0.25 MG/5ML
0.2500 mg | Freq: Once | INTRAVENOUS | Status: AC
  Administered 2016-10-18: 0.25 mg via INTRAVENOUS
  Filled 2016-10-18: qty 5

## 2016-10-18 MED ORDER — SODIUM CHLORIDE 0.9 % IV SOLN
375.0000 mg/m2 | Freq: Once | INTRAVENOUS | Status: AC
  Administered 2016-10-18: 800 mg via INTRAVENOUS
  Filled 2016-10-18: qty 50

## 2016-10-18 MED ORDER — SODIUM CHLORIDE 0.9 % IV SOLN
Freq: Once | INTRAVENOUS | Status: AC
  Administered 2016-10-18: 09:00:00 via INTRAVENOUS
  Filled 2016-10-18: qty 1000

## 2016-10-18 MED ORDER — PEGFILGRASTIM 6 MG/0.6ML ~~LOC~~ PSKT
6.0000 mg | PREFILLED_SYRINGE | Freq: Once | SUBCUTANEOUS | Status: AC
  Administered 2016-10-18: 6 mg via SUBCUTANEOUS
  Filled 2016-10-18: qty 0.6

## 2016-10-18 MED ORDER — SODIUM CHLORIDE 0.9 % IV SOLN: 375.0000 mg/m2 | Freq: Once | INTRAVENOUS | Status: DC

## 2016-10-18 MED ORDER — DIPHENHYDRAMINE HCL 25 MG PO CAPS
50.0000 mg | ORAL_CAPSULE | Freq: Once | ORAL | Status: AC
  Administered 2016-10-18: 50 mg via ORAL
  Filled 2016-10-18: qty 2

## 2016-10-18 MED ORDER — DOXORUBICIN HCL CHEMO IV INJECTION 2 MG/ML
100.0000 mg | Freq: Once | INTRAVENOUS | Status: AC
  Administered 2016-10-18: 100 mg via INTRAVENOUS
  Filled 2016-10-18: qty 50

## 2016-10-18 MED ORDER — VINCRISTINE SULFATE CHEMO INJECTION 1 MG/ML
2.0000 mg | Freq: Once | INTRAVENOUS | Status: AC
  Administered 2016-10-18: 2 mg via INTRAVENOUS
  Filled 2016-10-18: qty 2

## 2016-10-18 MED ORDER — SODIUM CHLORIDE 0.9 % IV SOLN
750.0000 mg/m2 | Freq: Once | INTRAVENOUS | Status: AC
  Administered 2016-10-18: 1600 mg via INTRAVENOUS
  Filled 2016-10-18: qty 80

## 2016-10-18 MED ORDER — HEPARIN SOD (PORK) LOCK FLUSH 100 UNIT/ML IV SOLN
500.0000 [IU] | Freq: Once | INTRAVENOUS | Status: AC | PRN
  Administered 2016-10-18: 500 [IU]
  Filled 2016-10-18: qty 5

## 2016-10-18 NOTE — Progress Notes (Signed)
Couple of nights he woke up to go to bathroom and hard to get back to sleep. Dizziness a couple of times when he went to get up. Advised of getting up slowly from chair to standing.

## 2016-10-18 NOTE — Progress Notes (Signed)
Hematology/Oncology Consult note [facility]  Telephone:([phone] Fax:(336) [phone]  Patient Care Team: Ezequiel Kayser, MD as PCP - General (Internal Medicine) Hollice Espy, MD as Consulting Physician (Urology)   Name of the patient: Christian Kelly  [id number]  24-Feb-1940   Date of visit: 10/18/16  Date of visit: 09/26/16  Diagnosis- 1. 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 Stage II DLBCL GCB. FISH for double hit negative  Chief complaint/ Reason for visit- on treatment assessment prior to cycle 3 of RCHOP  Heme/Onc history:1. Patient is a 76 yr old male with a h/o prostate cancer diagnosed in 1999s/p radical prostatectomy. Prior to that he had colon cancer in 1998 s/o surgery and adjuvant chemotherapy in 1998. With regards to prostate cancer- surgery was done at [facility] in [place] and he was followed at [place] subsequently. Patient think his Gleasons score was 7 at diagnosis. He has not required any radiation therapy or ADT so far. Patient still spends 4-5 months at [place].   2. Dr. Raechel Ache has been monitoring his PSArecently and his recent trend of PSA has been as follows: psa was 0.33 in feb 2017 and 0.63 in April 2018. We have 1 psa from 2015 when it was 0.3  3. Patient was referred to Dr. Erlene Quan urology and Dr. Baruch Gouty for salvage radiation.   4. Dr. Erlene Quan ordered CT abdomen which showed: IMPRESSION: 1. Status post prostatectomy, without locally recurrent disease. 2. Extensive abdominal adenopathy. Given the clinical history, most likely related to metastatic prostate carcinoma. Lymphoma could look similar. 3. Coronary artery atherosclerosis. Aortic atherosclerosis. 4. Vague upper sacral sclerosis is felt unlikely to represent metastatic disease, given lack of correlate on yesterday's bone scan. Correlate with radiation therapy, as radiation induced necrosis could have this appearance.  Recommend attention on follow-up.  5. This was followed by PET/CT scan which showed: IMPRESSION: 1. Hypermetabolic gastrohepatic ligament, abdominal retroperitoneal and small bowel mesenteric adenopathy, most indicative of lymphoma. 2. Aortic atherosclerosis (ICD10-170.0). Coronary artery Calcification.  6. Patient underwent CT-guided biopsy of the mesenteric lymph node which showed:DIAGNOSIS:  A. LYMPH NODE, MESENTERIC; CT-GUIDED CORE BIOPSY:  - LARGE B-CELL LYMPHOMA, CD10 POSITIVE.   Comment:  There is a diffuse proliferation of large lymphocytes with irregular  nuclear contours, inconspicuous nucleoli, and scant cytoplasm. The flow  cytometry and IHC results are most consistent with diffuse large B-cell  lymphoma, not otherwise specified, germinal center B-cell type. However,  it is likely that [place] for MYC, BCL2 and BCL6 gene rearrangements will  be ordered, so final classification will be based on the [place] result  7. Bone marrow biopsy was negative for lymphoma  8. 17% of nuclei were positive for BCL-2 rearrangement. 35% of nuclei had extra myc signals but no gene rearrangement for cmyc noted. This was consistent with evolved lymphoma or DLBCL. Not consistent with with double hit or double expressor   Interval history- doing well. Denies any complaints today  ECOG PS- 0 Pain scale- 0 Opioid associated constipation- no  Review of systems- Review of Systems  Constitutional: Negative for chills, fever, malaise/fatigue and weight loss.  HENT: Negative for congestion, ear discharge and nosebleeds.   Eyes: Negative for blurred vision.  Respiratory: Negative for cough, hemoptysis, sputum production, shortness of breath and wheezing.   Cardiovascular: Negative for chest pain, palpitations, orthopnea and claudication.  Gastrointestinal: Negative for abdominal pain, blood in stool, constipation, diarrhea, heartburn, melena, nausea and vomiting.  Genitourinary: Negative for  dysuria, flank pain,  frequency, hematuria and urgency.  Musculoskeletal: Negative for back pain, joint pain and myalgias.  Skin: Negative for rash.  Neurological: Negative for dizziness, tingling, focal weakness, seizures, weakness and headaches.  Endo/Heme/Allergies: Does not bruise/bleed easily.  Psychiatric/Behavioral: Negative for depression and suicidal ideas. The patient does not have insomnia.      Allergies  Allergen Reactions  . Tape Rash     Past Medical History:  Diagnosis Date  . Arthritis   . Basal cell carcinoma 2008   forehead  . Cancer ([place])   . Chronic renal insufficiency, stage III (moderate)   . Colon cancer ([place]) 1998  . Diabetes mellitus without complication ([place])   . History of kidney stones   . Hyperlipemia   . Hypertension   . Lyme disease   . Prostate cancer ([place]) 1999     Past Surgical History:  Procedure Laterality Date  . COLON SURGERY  1998  . COLONOSCOPY    . COLONOSCOPY WITH PROPOFOL N/A 12/29/2014   Procedure: COLONOSCOPY WITH PROPOFOL;  Surgeon: Lollie Sails, MD;  Location: [facility] ENDOSCOPY;  Service: Endoscopy;  Laterality: N/A;  . EYE SURGERY    . FRACTURE SURGERY    . IR FLUORO GUIDE PORT INSERTION RIGHT  09/01/2016  . LITHOTRIPSY    . PROSTATECTOMY  1998  . TONSILLECTOMY      Social History   Social History  . Marital status: Married    Spouse name: N/A  . Number of children: N/A  . Years of education: N/A   Occupational History  . Not on file.   Social History Main Topics  . Smoking status: Former Smoker    Packs/day: 1.00    Years: 8.00    Types: Cigarettes    Quit date: 08/13/1974  . Smokeless tobacco: Former Systems developer    Types: Snuff  . Alcohol use Yes     Comment: rarely-one or two beer  . Drug use: No  . Sexual activity: Not Currently   Other Topics Concern  . Not on file   Social History Narrative  . No narrative on file    Family History  Problem Relation Age of Onset  . Coronary artery disease  Mother   . Diabetes Mother   . Prostate cancer Father   . Pancreatitis Father   . Bladder Cancer Sister   . Diabetes Brother   . Diabetes Brother   . Prostate cancer Brother   . Diabetes Brother   . Diabetes Maternal Grandmother      Current Outpatient Prescriptions:  .  allopurinol (ZYLOPRIM) 300 MG tablet, Take 1 tablet (300 mg total) by mouth daily., Disp: 30 tablet, Rfl: 3 .  aspirin EC 81 MG tablet, Take 81 mg by mouth every other day. , Disp: , Rfl:  .  atorvastatin (LIPITOR) 40 MG tablet, Take 40 mg by mouth daily., Disp: , Rfl:  .  hydrochlorothiazide (HYDRODIURIL) 25 MG tablet, Take 25 mg by mouth daily., Disp: , Rfl:  .  lidocaine-prilocaine (EMLA) cream, Apply to affected area once, Disp: 30 g, Rfl: 3 .  lisinopril (PRINIVIL,ZESTRIL) 10 MG tablet, Take 10 mg by mouth daily., Disp: , Rfl:  .  LORazepam (ATIVAN) 0.5 MG tablet, Take 1 tablet (0.5 mg total) by mouth every 6 (six) hours as needed (Nausea or vomiting). (Patient not taking: Reported on 09/27/2016), Disp: 30 tablet, Rfl: 0 .  Multiple Vitamin (MULTIVITAMIN) capsule, Take 1 capsule by mouth daily., Disp: , Rfl:  .  omeprazole (PRILOSEC) 20 MG  capsule, Take 1 capsule (20 mg total) by mouth daily., Disp: 30 capsule, Rfl: 1 .  ondansetron (ZOFRAN) 8 MG tablet, Take 1 tablet (8 mg total) by mouth 2 (two) times daily as needed for refractory nausea / vomiting. Start on day 3 after cyclophosphamide chemotherapy., Disp: 30 tablet, Rfl: 1 .  predniSONE (DELTASONE) 50 MG tablet, Take 2 tablets (100 mg total) by mouth daily. Take on days 1-5 of each chemotherapy given every 3 weeks, Disp: 30 tablet, Rfl: 0 .  prochlorperazine (COMPAZINE) 10 MG tablet, Take 1 tablet (10 mg total) by mouth every 6 (six) hours as needed (Nausea or vomiting)., Disp: 30 tablet, Rfl: 6  Physical exam:  Vitals:   10/18/16 0852  BP: 112/67  Pulse: 85  Resp: 18  Temp: (!) 96.8 F (36 C)  TempSrc: Tympanic  Weight: 191 lb 14.4 oz (87 kg)  Height: 6'  (1.829 m)   Physical Exam  Constitutional: He is oriented to person, place, and time and well-developed, well-nourished, and in no distress.  HENT:  Head: Normocephalic and atraumatic.  Eyes: Pupils are equal, round, and reactive to light. EOM are normal.  Neck: Normal range of motion.  Cardiovascular: Normal rate, regular rhythm and normal heart sounds.   Pulmonary/Chest: Effort normal and breath sounds normal.  Abdominal: Soft. Bowel sounds are normal.  Neurological: He is alert and oriented to person, place, and time.  Skin: Skin is warm and dry.     CMP Latest Ref Rng & Units 09/27/2016  Glucose 65 - 99 mg/dL 224(H)  BUN 6 - 20 mg/dL 24(H)  Creatinine 0.61 - 1.24 mg/dL 1.10  Sodium 135 - 145 mmol/L 137  Potassium 3.5 - 5.1 mmol/L 4.0  Chloride 101 - 111 mmol/L 102  CO2 22 - 32 mmol/L 26  Calcium 8.9 - 10.3 mg/dL 9.1  Total Protein 6.5 - 8.1 g/dL 6.6  Total Bilirubin 0.3 - 1.2 mg/dL 0.6  Alkaline Phos 38 - 126 U/L 98  AST 15 - 41 U/L 26  ALT 17 - 63 U/L 22   CBC Latest Ref Rng & Units 09/27/2016  WBC 3.8 - 10.6 K/uL 9.9  Hemoglobin 13.0 - 18.0 g/dL 13.5  Hematocrit 40.0 - 52.0 % 37.8(L)  Platelets 150 - 440 K/uL 305      Assessment and plan- Patient is a 76 y.o. male with Stage II DLBCL here for consideration of cycle 3 of RCHOP.   Counts today are ok to proceed with cycle 3 of RCHOP with neulasta support. Will do PET scan in 2 weeks and return to clinic in 3 weeks for consideration of cycle 4 with labs at that time.   Ok to d/c allopurinol at this time  He will call us in the interim if there are any questions or concerns.    Visit Diagnosis 1. Diffuse large B-cell lymphoma of intra-abdominal lymph nodes ([place])   2. Encounter for antineoplastic chemotherapy   3. Encounter for antineoplastic immunotherapy      Dr. Randa Evens, MD, MPH [facility] at [facility] Pager- [phone] 10/18/2016 1:03 PM

## 2016-11-01 ENCOUNTER — Ambulatory Visit
Admission: RE | Admit: 2016-11-01 | Discharge: 2016-11-01 | Disposition: A | Discharge status: Home / Self Care | Payer: Medicare HMO | Source: Ambulatory Visit | Attending: Oncology | Admitting: Oncology

## 2016-11-01 DIAGNOSIS — R1909 Other intra-abdominal and pelvic swelling, mass and lump: Secondary | ICD-10-CM | POA: Diagnosis not present

## 2016-11-01 DIAGNOSIS — C8330 Diffuse large B-cell lymphoma, unspecified site: Secondary | ICD-10-CM | POA: Diagnosis not present

## 2016-11-01 DIAGNOSIS — C8333 Diffuse large B-cell lymphoma, intra-abdominal lymph nodes: Secondary | ICD-10-CM | POA: Insufficient documentation

## 2016-11-01 LAB — GLUCOSE, CAPILLARY
Glucose-Capillary: 201 mg/dL — ABNORMAL HIGH (ref 65–99)
Glucose-Capillary: 189 mg/dL — ABNORMAL HIGH (ref 65–99)

## 2016-11-01 MED ORDER — FLUDEOXYGLUCOSE F - 18 (FDG) INJECTION
12.0000 | Freq: Once | INTRAVENOUS | Status: AC | PRN
  Administered 2016-11-01: 12.8 via INTRAVENOUS

## 2016-11-05 DIAGNOSIS — S61219A Laceration without foreign body of unspecified finger without damage to nail, initial encounter: Secondary | ICD-10-CM | POA: Diagnosis not present

## 2016-11-05 DIAGNOSIS — S61011A Laceration without foreign body of right thumb without damage to nail, initial encounter: Secondary | ICD-10-CM | POA: Diagnosis not present

## 2016-11-07 NOTE — Progress Notes (Signed)
Hematology/Oncology Consult note [facility]  Telephone:([phone] Fax:(336) [phone]  Patient Care Team: Ezequiel Kayser, MD as PCP - General (Internal Medicine) Hollice Espy, MD as Consulting Physician (Urology)   Name of the patient: Christian Kelly  [id number]  [birth year]   Date of visit: 11/07/16  Diagnosis- 1. 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 Stage II DLBCL GCB. FISH for double hit negative   Chief complaint/ Reason for visit- 1. Discuss interim pet ct results.  2. on treatment assessment prior to cycle 4 of RCHOP  Heme/Onc history: 1. Patient is a 76 yr old male with a h/o prostate cancer diagnosed in 1999s/p radical prostatectomy. Prior to that he had colon cancer in 1998 s/o surgery and adjuvant chemotherapy in 1998. With regards to prostate cancer- surgery was done at [facility] in [place] and he was followed at [place] subsequently. Patient think his Gleasons score was 7 at diagnosis. He has not required any radiation therapy or ADT so far. Patient still spends 4-5 months at [place].   2. Dr. Raechel Ache has been monitoring his PSArecently and his recent trend of PSA has been as follows: psa was 0.33 in feb 2017 and 0.63 in April 2018. We have 1 psa from 2015 when it was 0.3  3. Patient was referred to Dr. Erlene Quan urology and Dr. Baruch Gouty for salvage radiation.   4. Dr. Erlene Quan ordered CT abdomen which showed: IMPRESSION: 1. Status post prostatectomy, without locally recurrent disease. 2. Extensive abdominal adenopathy. Given the clinical history, most likely related to metastatic prostate carcinoma. Lymphoma could look similar. 3. Coronary artery atherosclerosis. Aortic atherosclerosis. 4. Vague upper sacral sclerosis is felt unlikely to represent metastatic disease, given lack of correlate on yesterday's bone scan. Correlate with radiation therapy, as radiation induced necrosis could have  this appearance. Recommend attention on follow-up.  5. This was followed by PET/CT scan which showed: IMPRESSION: 1. Hypermetabolic gastrohepatic ligament, abdominal retroperitoneal and small bowel mesenteric adenopathy, most indicative of lymphoma. 2. Aortic atherosclerosis (ICD10-170.0). Coronary artery Calcification.  6. Patient underwent CT-guided biopsy of the mesenteric lymph node which showed:DIAGNOSIS:  A. LYMPH NODE, MESENTERIC; CT-GUIDED CORE BIOPSY:  - LARGE B-CELL LYMPHOMA, CD10 POSITIVE.   Comment:  There is a diffuse proliferation of large lymphocytes with irregular  nuclear contours, inconspicuous nucleoli, and scant cytoplasm. The flow  cytometry and IHC results are most consistent with diffuse large B-cell  lymphoma, not otherwise specified, germinal center B-cell type. However,  it is likely that [place] for MYC, BCL2 and BCL6 gene rearrangements will  be ordered, so final classification will be based on the [place] result  7. Bone marrow biopsy was negative for lymphoma  8. 17% of nuclei were positive for BCL-2 rearrangement. 35% of nuclei had extra myc signals but no gene rearrangement for cmyc noted. This was consistent with evolved lymphoma or DLBCL. Not consistentwithwith double hit or double expressor  9. Interim scans after 3 cycles of RCHOP showed: IMPRESSION: 2.2 x 5.3 cm jejunal mesenteric nodal mass along the anterior mid abdomen, significantly improved.   Interval history- Patient injured his right thumb while working on power tools and had to get it sutured. Feels fatigued. He has lost 9 pounds of weight in the last month. Feels occasional nausea controlled with zofran  ECOG PS- 1 Pain scale- 0   Review of systems- Review of Systems  Constitutional: Positive for malaise/fatigue and weight loss. Negative for chills and fever.  HENT: Negative for congestion, ear  discharge and nosebleeds.   Eyes: Negative for blurred vision.  Respiratory: Negative  for cough, hemoptysis, sputum production, shortness of breath and wheezing.   Cardiovascular: Negative for chest pain, palpitations, orthopnea and claudication.  Gastrointestinal: Positive for nausea. Negative for abdominal pain, blood in stool, constipation, diarrhea, heartburn, melena and vomiting.  Genitourinary: Negative for dysuria, flank pain, frequency, hematuria and urgency.  Musculoskeletal: Negative for back pain, joint pain and myalgias.  Skin: Negative for rash.  Neurological: Negative for dizziness, tingling, focal weakness, seizures, weakness and headaches.  Endo/Heme/Allergies: Does not bruise/bleed easily.  Psychiatric/Behavioral: Negative for depression and suicidal ideas. The patient does not have insomnia.       Allergies  Allergen Reactions  . Tape Rash     Past Medical History:  Diagnosis Date  . Arthritis   . Basal cell carcinoma 2008   forehead  . Cancer ([place])   . Chronic renal insufficiency, stage III (moderate)   . Colon cancer ([place]) 1998  . Diabetes mellitus without complication ([place])    Controlled with diet only as of 11/01/2016  . History of kidney stones   . Hyperlipemia   . Hypertension   . Lyme disease   . Prostate cancer ([place]) 1999     Past Surgical History:  Procedure Laterality Date  . COLON SURGERY  1998  . COLONOSCOPY    . COLONOSCOPY WITH PROPOFOL N/A 12/29/2014   Procedure: COLONOSCOPY WITH PROPOFOL;  Surgeon: Lollie Sails, MD;  [facility] ENDOSCOPY;  Service: Endoscopy;  Laterality: N/A;  . EYE SURGERY    . FRACTURE SURGERY    . IR FLUORO GUIDE PORT INSERTION RIGHT  09/01/2016  . LITHOTRIPSY    . PROSTATECTOMY  1998  . TONSILLECTOMY      Social History   Social History  . Marital status: Married    Spouse name: N/A  . Number of children: N/A  . Years of education: N/A   Occupational History  . Not on file.   Social History Main Topics  . Smoking status: Former Smoker    Packs/day: 1.00    Years: 8.00     Types: Cigarettes    Quit date: 08/13/1974  . Smokeless tobacco: Former Systems developer    Types: Snuff  . Alcohol use Yes     Comment: rarely-one or two beer  . Drug use: No  . Sexual activity: Not Currently   Other Topics Concern  . Not on file   Social History Narrative  . No narrative on file    Family History  Problem Relation Age of Onset  . Coronary artery disease Mother   . Diabetes Mother   . Prostate cancer Father   . Pancreatitis Father   . Bladder Cancer Sister   . Diabetes Brother   . Diabetes Brother   . Prostate cancer Brother   . Diabetes Brother   . Diabetes Maternal Grandmother      Current Outpatient Prescriptions:  .  allopurinol (ZYLOPRIM) 300 MG tablet, Take 1 tablet (300 mg total) by mouth daily., Disp: 30 tablet, Rfl: 3 .  aspirin EC 81 MG tablet, Take 81 mg by mouth every other day. , Disp: , Rfl:  .  atorvastatin (LIPITOR) 40 MG tablet, Take 40 mg by mouth daily., Disp: , Rfl:  .  hydrochlorothiazide (HYDRODIURIL) 25 MG tablet, Take 25 mg by mouth daily., Disp: , Rfl:  .  lidocaine-prilocaine (EMLA) cream, Apply to affected area once, Disp: 30 g, Rfl: 3 .  lisinopril (PRINIVIL,ZESTRIL)  10 MG tablet, Take 10 mg by mouth daily., Disp: , Rfl:  .  LORazepam (ATIVAN) 0.5 MG tablet, Take 1 tablet (0.5 mg total) by mouth every 6 (six) hours as needed (Nausea or vomiting). (Patient not taking: Reported on 09/27/2016), Disp: 30 tablet, Rfl: 0 .  Multiple Vitamin (MULTIVITAMIN) capsule, Take 1 capsule by mouth daily., Disp: , Rfl:  .  omeprazole (PRILOSEC) 20 MG capsule, Take 1 capsule (20 mg total) by mouth daily., Disp: 30 capsule, Rfl: 1 .  ondansetron (ZOFRAN) 8 MG tablet, Take 1 tablet (8 mg total) by mouth 2 (two) times daily as needed for refractory nausea / vomiting. Start on day 3 after cyclophosphamide chemotherapy. (Patient not taking: Reported on 10/18/2016), Disp: 30 tablet, Rfl: 1 .  predniSONE (DELTASONE) 50 MG tablet, Take 2 tablets (100 mg total) by mouth  daily. Take on days 1-5 of each chemotherapy given every 3 weeks, Disp: 30 tablet, Rfl: 0 .  prochlorperazine (COMPAZINE) 10 MG tablet, Take 1 tablet (10 mg total) by mouth every 6 (six) hours as needed (Nausea or vomiting). (Patient not taking: Reported on 10/18/2016), Disp: 30 tablet, Rfl: 6  Physical exam:  Vitals:   11/08/16 0855  BP: 112/72  Pulse: 99  Resp: 18  Temp: (!) 96.5 F (35.8 C)  TempSrc: Tympanic  Weight: 186 lb (84.4 kg)   Physical Exam  Constitutional: He is oriented to person, place, and time and well-developed, well-nourished, and in no distress.  Mildly fatigued  HENT:  Head: Normocephalic and atraumatic.  Eyes: Pupils are equal, round, and reactive to light. EOM are normal.  Neck: Normal range of motion.  Cardiovascular: Normal rate, regular rhythm and normal heart sounds.   Pulmonary/Chest: Effort normal and breath sounds normal.  Abdominal: Soft. Bowel sounds are normal.  Musculoskeletal:  Dressing in place over right thumb  Neurological: He is alert and oriented to person, place, and time.  Skin: Skin is warm and dry.     CMP Latest Ref Rng & Units 11/08/2016  Glucose 65 - 99 mg/dL 326(H)  BUN 6 - 20 mg/dL 26(H)  Creatinine 0.61 - 1.24 mg/dL 1.29(H)  Sodium 135 - 145 mmol/L 135  Potassium 3.5 - 5.1 mmol/L 3.9  Chloride 101 - 111 mmol/L 98(L)  CO2 22 - 32 mmol/L 26  Calcium 8.9 - 10.3 mg/dL 9.1  Total Protein 6.5 - 8.1 g/dL 6.7  Total Bilirubin 0.3 - 1.2 mg/dL 0.7  Alkaline Phos 38 - 126 U/L 82  AST 15 - 41 U/L 27  ALT 17 - 63 U/L 22   CBC Latest Ref Rng & Units 11/08/2016  WBC 3.8 - 10.6 K/uL 9.7  Hemoglobin 13.0 - 18.0 g/dL 10.8(L)  Hematocrit 40.0 - 52.0 % 30.8(L)  Platelets 150 - 440 K/uL 365    No images are attached to the encounter.  Nm Pet Image Restag (ps) Skull Base To Thigh  Result Date: 11/01/2016 CLINICAL DATA:  Subsequent treatment strategy for diffuse large B-cell lymphoma. EXAM: NUCLEAR MEDICINE PET SKULL BASE TO THIGH  TECHNIQUE: 12.8 mCi F-18 FDG was injected intravenously. Full-ring PET imaging was performed from the skull base to thigh after the radiotracer. CT data was obtained and used for attenuation correction and anatomic localization. FASTING BLOOD GLUCOSE:  Value: 189 mg/dl COMPARISON:  CT chest dated 09/14/2016.  PET-CT dated 08/03/2016. FINDINGS: NECK: No hypermetabolic cervical lymphadenopathy. CHEST: No hypermetabolic thoracic lymphadenopathy. No suspicious pulmonary nodules. The heart is normal in size. No thoracic aortic aneurysm. Atherosclerotic calcifications aortic  arch. Coronary atherosclerosis of the LAD and left circumflex. Right chest port terminates the cavoatrial junction. ABDOMEN/PELVIS: 2.2 x 5.3 cm mesenteric nodal mass in the anterior mid abdomen (series 3/ image 204) with max SUV 2.6, previously 4.5 x 9.3 cm in aggregate with max SUV 11.5. Additional small jejunal mesentery lymph nodes. Spleen is normal in size. Status post left hemicolectomy with suture line in the left lower pelvis (series 3/ image 253). Status post prostatectomy. Atherosclerotic calcifications of the abdominal aorta and branch vessels. No evidence of abdominal aortic aneurysm. Mild hepatic steatosis. SKELETON: No focal hypermetabolic activity to suggest skeletal metastasis. Mild diffuse hypermetabolism, particularly involving the pelvis. IMPRESSION: 2.2 x 5.3 cm jejunal mesenteric nodal mass along the anterior mid abdomen, significantly improved. Otherwise, no findings suspicious for active lymphoma. Electronically Signed   By: Julian Hy M.D.   On: 11/01/2016 12:15     Assessment and plan- Patient is a 76 y.o. male with Stage II DLBCL here for consideration of cycle 4 of RCHOP.  Discussed results of interim PET/CT scan with the patient which shows significant decrease in the size of mesenteric mass and SUV uptake. No other areas of hypermetabolism seen. I will complete 3 more cycles of RCHOP at this time. And repeat  PET after 6 cycles of RCHOP   Counts today are ok to proceed with cycle 4 of RCHOP with neulasta support. Return to clinic in 3 weeks for consideration of cycle 5 and check cbc, cmp that time  Hyperglycemia- patient not a known diabetic. Prior blood sugars between 130-200. I have asked him to rec check blood sugar at home and if still high in 300's, he should defer startign prednisone until tomorrow. Also speak to PCP regarding hyperglycemia if blood sugars remain >200.  Chemo induced anemia- will check iron studies, b12 and folate with next set of labs  Chemo induced nausea- use prn zofran   Visit Diagnosis 1. Diffuse large B-cell lymphoma of intra-abdominal lymph nodes ([place])   2. Encounter for antineoplastic chemotherapy   3. Encounter for monitoring rituximab therapy   4. Chemotherapy-induced nausea   5. Anemia due to antineoplastic chemotherapy   6. Hyperglycemia      Dr. Randa Evens, MD, MPH [facility] at [facility] Pager- [phone] 11/08/2016 9:33 AM

## 2016-11-08 ENCOUNTER — Inpatient Hospital Stay: Payer: Medicare HMO

## 2016-11-08 ENCOUNTER — Inpatient Hospital Stay: Payer: Medicare HMO | Attending: Oncology | Admitting: Oncology

## 2016-11-08 ENCOUNTER — Encounter: Payer: Self-pay | Admitting: Oncology

## 2016-11-08 VITALS — BP 100/66 | HR 78 | Resp 18

## 2016-11-08 VITALS — BP 112/72 | HR 99 | Temp 96.5°F | Resp 18 | Wt 186.0 lb

## 2016-11-08 DIAGNOSIS — Z5111 Encounter for antineoplastic chemotherapy: Secondary | ICD-10-CM | POA: Diagnosis not present

## 2016-11-08 DIAGNOSIS — Z85828 Personal history of other malignant neoplasm of skin: Secondary | ICD-10-CM | POA: Insufficient documentation

## 2016-11-08 DIAGNOSIS — C8333 Diffuse large B-cell lymphoma, intra-abdominal lymph nodes: Secondary | ICD-10-CM

## 2016-11-08 DIAGNOSIS — C8338 Diffuse large B-cell lymphoma, lymph nodes of multiple sites: Secondary | ICD-10-CM | POA: Insufficient documentation

## 2016-11-08 DIAGNOSIS — Z5112 Encounter for antineoplastic immunotherapy: Secondary | ICD-10-CM | POA: Insufficient documentation

## 2016-11-08 DIAGNOSIS — Z7982 Long term (current) use of aspirin: Secondary | ICD-10-CM | POA: Insufficient documentation

## 2016-11-08 DIAGNOSIS — E785 Hyperlipidemia, unspecified: Secondary | ICD-10-CM | POA: Diagnosis not present

## 2016-11-08 DIAGNOSIS — C61 Malignant neoplasm of prostate: Secondary | ICD-10-CM | POA: Insufficient documentation

## 2016-11-08 DIAGNOSIS — E1165 Type 2 diabetes mellitus with hyperglycemia: Secondary | ICD-10-CM | POA: Insufficient documentation

## 2016-11-08 DIAGNOSIS — Z87442 Personal history of urinary calculi: Secondary | ICD-10-CM | POA: Insufficient documentation

## 2016-11-08 DIAGNOSIS — R110 Nausea: Secondary | ICD-10-CM | POA: Insufficient documentation

## 2016-11-08 DIAGNOSIS — Z79620 Long term (current) use of immunosuppressive biologic: Secondary | ICD-10-CM

## 2016-11-08 DIAGNOSIS — D6481 Anemia due to antineoplastic chemotherapy: Secondary | ICD-10-CM

## 2016-11-08 DIAGNOSIS — G9589 Other specified diseases of spinal cord: Secondary | ICD-10-CM | POA: Insufficient documentation

## 2016-11-08 DIAGNOSIS — I2510 Atherosclerotic heart disease of native coronary artery without angina pectoris: Secondary | ICD-10-CM | POA: Diagnosis not present

## 2016-11-08 DIAGNOSIS — I129 Hypertensive chronic kidney disease with stage 1 through stage 4 chronic kidney disease, or unspecified chronic kidney disease: Secondary | ICD-10-CM | POA: Diagnosis not present

## 2016-11-08 DIAGNOSIS — R531 Weakness: Secondary | ICD-10-CM | POA: Diagnosis not present

## 2016-11-08 DIAGNOSIS — N183 Chronic kidney disease, stage 3 (moderate): Secondary | ICD-10-CM | POA: Insufficient documentation

## 2016-11-08 DIAGNOSIS — R634 Abnormal weight loss: Secondary | ICD-10-CM | POA: Diagnosis not present

## 2016-11-08 DIAGNOSIS — Z8042 Family history of malignant neoplasm of prostate: Secondary | ICD-10-CM

## 2016-11-08 DIAGNOSIS — R5383 Other fatigue: Secondary | ICD-10-CM | POA: Diagnosis not present

## 2016-11-08 DIAGNOSIS — Z79899 Other long term (current) drug therapy: Secondary | ICD-10-CM | POA: Insufficient documentation

## 2016-11-08 DIAGNOSIS — R739 Hyperglycemia, unspecified: Secondary | ICD-10-CM

## 2016-11-08 DIAGNOSIS — Z5181 Encounter for therapeutic drug level monitoring: Secondary | ICD-10-CM

## 2016-11-08 DIAGNOSIS — I700 Atherosclerosis of aorta: Secondary | ICD-10-CM | POA: Insufficient documentation

## 2016-11-08 DIAGNOSIS — Z8052 Family history of malignant neoplasm of bladder: Secondary | ICD-10-CM | POA: Diagnosis not present

## 2016-11-08 DIAGNOSIS — Z87891 Personal history of nicotine dependence: Secondary | ICD-10-CM | POA: Insufficient documentation

## 2016-11-08 DIAGNOSIS — Z85038 Personal history of other malignant neoplasm of large intestine: Secondary | ICD-10-CM | POA: Diagnosis not present

## 2016-11-08 DIAGNOSIS — T451X5A Adverse effect of antineoplastic and immunosuppressive drugs, initial encounter: Secondary | ICD-10-CM

## 2016-11-08 LAB — COMPREHENSIVE METABOLIC PANEL
ALK PHOS: 82 U/L (ref 38–126)
ALT: 22 U/L (ref 17–63)
ANION GAP: 11 (ref 5–15)
AST: 27 U/L (ref 15–41)
Albumin: 3.2 g/dL — ABNORMAL LOW (ref 3.5–5.0)
BUN: 26 mg/dL — ABNORMAL HIGH (ref 6–20)
CALCIUM: 9.1 mg/dL (ref 8.9–10.3)
CO2: 26 mmol/L (ref 22–32)
Chloride: 98 mmol/L — ABNORMAL LOW (ref 101–111)
Creatinine, Ser: 1.29 mg/dL — ABNORMAL HIGH (ref 0.61–1.24)
GFR calc non Af Amer: 53 mL/min — ABNORMAL LOW (ref >60)
Glucose, Bld: 326 mg/dL — ABNORMAL HIGH (ref 65–99)
Potassium: 3.9 mmol/L (ref 3.5–5.1)
SODIUM: 135 mmol/L (ref 135–145)
TOTAL PROTEIN: 6.7 g/dL (ref 6.5–8.1)
Total Bilirubin: 0.7 mg/dL (ref 0.3–1.2)

## 2016-11-08 LAB — CBC WITH DIFFERENTIAL/PLATELET
Basophils Absolute: 0.1 10*3/uL (ref 0–0.1)
Basophils Relative: 1 %
EOS ABS: 0.1 10*3/uL (ref 0–0.7)
EOS PCT: 1 %
HCT: 30.8 % — ABNORMAL LOW (ref 40.0–52.0)
HEMOGLOBIN: 10.8 g/dL — AB (ref 13.0–18.0)
LYMPHS ABS: 0.4 10*3/uL — AB (ref 1.0–3.6)
Lymphocytes Relative: 4 %
MCH: 32.2 pg (ref 26.0–34.0)
MCHC: 35.2 g/dL (ref 32.0–36.0)
MCV: 91.3 fL (ref 80.0–100.0)
MONOS PCT: 15 %
Monocytes Absolute: 1.5 10*3/uL — ABNORMAL HIGH (ref 0.2–1.0)
NEUTROS PCT: 79 %
Neutro Abs: 7.6 10*3/uL — ABNORMAL HIGH (ref 1.4–6.5)
Platelets: 365 10*3/uL (ref 150–440)
RBC: 3.37 MIL/uL — ABNORMAL LOW (ref 4.40–5.90)
RDW: 14.7 % — ABNORMAL HIGH (ref 11.5–14.5)
WBC: 9.7 10*3/uL (ref 3.8–10.6)

## 2016-11-08 MED ORDER — SODIUM CHLORIDE 0.9 % IV SOLN
Freq: Once | INTRAVENOUS | Status: AC
  Administered 2016-11-08: 10:00:00 via INTRAVENOUS
  Filled 2016-11-08: qty 1000

## 2016-11-08 MED ORDER — SODIUM CHLORIDE 0.9 % IV SOLN: 375.0000 mg/m2 | Freq: Once | INTRAVENOUS | Status: DC

## 2016-11-08 MED ORDER — HEPARIN SOD (PORK) LOCK FLUSH 100 UNIT/ML IV SOLN
500.0000 [IU] | Freq: Once | INTRAVENOUS | Status: AC | PRN
  Administered 2016-11-08: 500 [IU]
  Filled 2016-11-08: qty 5

## 2016-11-08 MED ORDER — ACETAMINOPHEN 325 MG PO TABS
650.0000 mg | ORAL_TABLET | Freq: Once | ORAL | Status: AC
  Administered 2016-11-08: 650 mg via ORAL
  Filled 2016-11-08: qty 2

## 2016-11-08 MED ORDER — PEGFILGRASTIM 6 MG/0.6ML ~~LOC~~ PSKT
6.0000 mg | PREFILLED_SYRINGE | Freq: Once | SUBCUTANEOUS | Status: AC
  Administered 2016-11-08: 6 mg via SUBCUTANEOUS
  Filled 2016-11-08: qty 0.6

## 2016-11-08 MED ORDER — DEXAMETHASONE SODIUM PHOSPHATE 10 MG/ML IJ SOLN
10.0000 mg | Freq: Once | INTRAMUSCULAR | Status: AC
  Administered 2016-11-08: 10 mg via INTRAVENOUS
  Filled 2016-11-08: qty 1

## 2016-11-08 MED ORDER — DOXORUBICIN HCL CHEMO IV INJECTION 2 MG/ML
100.0000 mg | Freq: Once | INTRAVENOUS | Status: AC
  Administered 2016-11-08: 100 mg via INTRAVENOUS
  Filled 2016-11-08: qty 50

## 2016-11-08 MED ORDER — SODIUM CHLORIDE 0.9 % IV SOLN
750.0000 mg/m2 | Freq: Once | INTRAVENOUS | Status: AC
  Administered 2016-11-08: 1600 mg via INTRAVENOUS
  Filled 2016-11-08: qty 80

## 2016-11-08 MED ORDER — DIPHENHYDRAMINE HCL 25 MG PO CAPS
50.0000 mg | ORAL_CAPSULE | Freq: Once | ORAL | Status: AC
  Administered 2016-11-08: 50 mg via ORAL
  Filled 2016-11-08: qty 2

## 2016-11-08 MED ORDER — PALONOSETRON HCL INJECTION 0.25 MG/5ML
0.2500 mg | Freq: Once | INTRAVENOUS | Status: AC
  Administered 2016-11-08: 0.25 mg via INTRAVENOUS
  Filled 2016-11-08: qty 5

## 2016-11-08 MED ORDER — VINCRISTINE SULFATE CHEMO INJECTION 1 MG/ML
2.0000 mg | Freq: Once | INTRAVENOUS | Status: AC
  Administered 2016-11-08: 2 mg via INTRAVENOUS
  Filled 2016-11-08: qty 2

## 2016-11-08 MED ORDER — RITUXIMAB CHEMO INJECTION 500 MG/50ML
375.0000 mg/m2 | Freq: Once | INTRAVENOUS | Status: AC
  Administered 2016-11-08: 800 mg via INTRAVENOUS
  Filled 2016-11-08: qty 50

## 2016-11-08 NOTE — Progress Notes (Signed)
Here for follow up. Pt using power tools on week end Sat 9/15 and " cut " part of R hand thumb -requiring 5 sutures. Now on antibiotics Keflex 500mg  x 5 d. Weak and w some nausea he stated

## 2016-11-11 DIAGNOSIS — E1122 Type 2 diabetes mellitus with diabetic chronic kidney disease: Secondary | ICD-10-CM | POA: Diagnosis not present

## 2016-11-11 DIAGNOSIS — N182 Chronic kidney disease, stage 2 (mild): Secondary | ICD-10-CM | POA: Diagnosis not present

## 2016-11-11 DIAGNOSIS — S62524G Nondisplaced fracture of distal phalanx of right thumb, subsequent encounter for fracture with delayed healing: Secondary | ICD-10-CM | POA: Diagnosis not present

## 2016-11-11 DIAGNOSIS — C8333 Diffuse large B-cell lymphoma, intra-abdominal lymph nodes: Secondary | ICD-10-CM | POA: Diagnosis not present

## 2016-11-13 ENCOUNTER — Other Ambulatory Visit: Payer: Self-pay | Admitting: Oncology

## 2016-11-18 DIAGNOSIS — I10 Essential (primary) hypertension: Secondary | ICD-10-CM | POA: Diagnosis not present

## 2016-11-18 DIAGNOSIS — C8333 Diffuse large B-cell lymphoma, intra-abdominal lymph nodes: Secondary | ICD-10-CM | POA: Diagnosis not present

## 2016-11-18 DIAGNOSIS — Z4802 Encounter for removal of sutures: Secondary | ICD-10-CM | POA: Diagnosis not present

## 2016-11-22 ENCOUNTER — Other Ambulatory Visit: Payer: Self-pay | Admitting: *Deleted

## 2016-11-22 DIAGNOSIS — C8333 Diffuse large B-cell lymphoma, intra-abdominal lymph nodes: Secondary | ICD-10-CM

## 2016-11-22 MED ORDER — PREDNISONE 50 MG PO TABS: 100.0000 mg | ORAL_TABLET | Freq: Every day | ORAL | 0 refills | Status: DC

## 2016-11-28 NOTE — Progress Notes (Signed)
Hematology/Oncology Consult note [facility]  Telephone:([phone] [phone]  Patient Care Team: Ezequiel Kayser, MD as PCP - General (Internal Medicine) Hollice Espy, MD as Consulting Physician (Urology)   Name of the patient: Christian Kelly  [id number]  [birth year]   Date of visit: 11/28/16  Diagnosis- 1. 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 Stage II DLBCL GCB. FISH for double hit negative  Chief complaint/ Reason for visit- on treatment assessment prior to cycle 5 of RCHOP  Heme/Onc history: 1. Patient is a 76 yr old male with a h/o prostate cancer diagnosed in 1999s/p radical prostatectomy. Prior to that he had colon cancer in 1998 s/o surgery and adjuvant chemotherapy in 1998. With regards to prostate cancer- surgery was done at [facility] in [place] and he was followed at [place] subsequently. Patient think his Gleasons score was 7 at diagnosis. He has not required any radiation therapy or ADT so far. Patient still spends 4-5 months at [place].   2. Dr. Raechel Ache has been monitoring his PSArecently and his recent trend of PSA has been as follows: psa was 0.33 in feb 2017 and 0.63 in April 2018. We have 1 psa from 2015 when it was 0.3  3. Patient was referred to Dr. Erlene Quan urology and Dr. Baruch Gouty for salvage radiation.   4. Dr. Erlene Quan ordered CT abdomen which showed: IMPRESSION: 1. Status post prostatectomy, without locally recurrent disease. 2. Extensive abdominal adenopathy. Given the clinical history, most likely related to metastatic prostate carcinoma. Lymphoma could look similar. 3. Coronary artery atherosclerosis. Aortic atherosclerosis. 4. Vague upper sacral sclerosis is felt unlikely to represent metastatic disease, given lack of correlate on yesterday's bone scan. Correlate with radiation therapy, as radiation induced necrosis could have this appearance. Recommend attention  on follow-up.  5. This was followed by PET/CT scan which showed: IMPRESSION: 1. Hypermetabolic gastrohepatic ligament, abdominal retroperitoneal and small bowel mesenteric adenopathy, most indicative of lymphoma. 2. Aortic atherosclerosis (ICD10-170.0). Coronary artery Calcification.  6. Patient underwent CT-guided biopsy of the mesenteric lymph node which showed:DIAGNOSIS:  A. LYMPH NODE, MESENTERIC; CT-GUIDED CORE BIOPSY:  - LARGE B-CELL LYMPHOMA, CD10 POSITIVE.   Comment:  There is a diffuse proliferation of large lymphocytes with irregular  nuclear contours, inconspicuous nucleoli, and scant cytoplasm. The flow  cytometry and IHC results are most consistent with diffuse large B-cell  lymphoma, not otherwise specified, germinal center B-cell type. However,  it is likely that [place] for MYC, BCL2 and BCL6 gene rearrangements will  be ordered, so final classification will be based on the [place] result  7. Bone marrow biopsy was negative for lymphoma  8. 17% of nuclei were positive for BCL-2 rearrangement. 35% of nuclei had extra myc signals but no gene rearrangement for cmyc noted. This was consistent with evolved lymphoma or DLBCL. Not consistentwithwith double hit or double expressor  9. Interim scans after 3 cycles of RCHOP showed: IMPRESSION: 2.2 x 5.3 cm jejunal mesenteric nodal mass along the anterior mid abdomen, significantly improved.   Interval history- he has been feeling more tired. Denies any lightheadedness or falls. He did not take his BP meds this morning. Blood sugars were well controlled lastw eek. Appetite has not been good and he has been drinking glucerna. Also reports belching and bloating  ECOG PS- 1 Pain scale- 0   Review of systems- Review of Systems  Constitutional: Positive for malaise/fatigue. Negative for chills, fever and weight loss.  HENT: Negative for congestion, ear discharge and  nosebleeds.   Eyes: Negative for blurred vision.   Respiratory: Negative for cough, hemoptysis, sputum production, shortness of breath and wheezing.   Cardiovascular: Negative for chest pain, palpitations, orthopnea and claudication.  Gastrointestinal: Negative for abdominal pain, blood in stool, constipation, diarrhea, heartburn, melena, nausea and vomiting.       Belching and bloating  Genitourinary: Negative for dysuria, flank pain, frequency, hematuria and urgency.  Musculoskeletal: Negative for back pain, joint pain and myalgias.  Skin: Negative for rash.  Neurological: Positive for weakness. Negative for dizziness, tingling, focal weakness, seizures and headaches.  Endo/Heme/Allergies: Does not bruise/bleed easily.  Psychiatric/Behavioral: Negative for depression and suicidal ideas. The patient does not have insomnia.       Allergies  Allergen Reactions  . Tape Rash     Past Medical History:  Diagnosis Date  . Arthritis   . Basal cell carcinoma 2008   forehead  . Cancer ([place])   . Chronic renal insufficiency, stage III (moderate)   . Colon cancer ([place]) 1998  . Diabetes mellitus without complication ([place])    Controlled with diet only as of 11/01/2016  . History of kidney stones   . Hyperlipemia   . Hypertension   . Lyme disease   . Prostate cancer ([place]) 1999     Past Surgical History:  Procedure Laterality Date  . COLON SURGERY  1998  . COLONOSCOPY    . COLONOSCOPY WITH PROPOFOL N/A 12/29/2014   Procedure: COLONOSCOPY WITH PROPOFOL;  Surgeon: Lollie Sails, MD;  Location: [facility] ENDOSCOPY;  Service: Endoscopy;  Laterality: N/A;  . EYE SURGERY    . FRACTURE SURGERY    . IR FLUORO GUIDE PORT INSERTION RIGHT  09/01/2016  . LITHOTRIPSY    . PROSTATECTOMY  1998  . TONSILLECTOMY      Social History   Social History  . Marital status: Married    Spouse name: N/A  . Number of children: N/A  . Years of education: N/A   Occupational History  . Not on file.   Social History Main Topics  . Smoking status:  Former Smoker    Packs/day: 1.00    Years: 8.00    Types: Cigarettes    Quit date: 08/13/1974  . Smokeless tobacco: Former Systems developer    Types: Snuff  . Alcohol use Yes     Comment: rarely-one or two beer  . Drug use: No  . Sexual activity: Not Currently   Other Topics Concern  . Not on file   Social History Narrative  . No narrative on file    Family History  Problem Relation Age of Onset  . Coronary artery disease Mother   . Diabetes Mother   . Prostate cancer Father   . Pancreatitis Father   . Bladder Cancer Sister   . Diabetes Brother   . Diabetes Brother   . Prostate cancer Brother   . Diabetes Brother   . Diabetes Maternal Grandmother      Current Outpatient Prescriptions:  .  ACCU-CHEK FASTCLIX LANCETS MISC, , Disp: , Rfl:  .  allopurinol (ZYLOPRIM) 300 MG tablet, Take 1 tablet (300 mg total) by mouth daily. (Patient not taking: Reported on 11/08/2016), Disp: 30 tablet, Rfl: 3 .  aspirin EC 81 MG tablet, Take 81 mg by mouth every other day. , Disp: , Rfl:  .  atorvastatin (LIPITOR) 40 MG tablet, Take 40 mg by mouth daily., Disp: , Rfl:  .  Blood Glucose Monitoring Suppl (TRUE METRIX AIR GLUCOSE METER) w/Device  KIT, , Disp: , Rfl:  .  cephALEXin (KEFLEX) 500 MG capsule, Take 500 mg by mouth 4 (four) times daily., Disp: , Rfl:  .  glucose blood (TRUE METRIX BLOOD GLUCOSE TEST) test strip, Use once daily. Use as instructed., Disp: , Rfl:  .  hydrochlorothiazide (HYDRODIURIL) 25 MG tablet, Take 25 mg by mouth daily., Disp: , Rfl:  .  lidocaine-prilocaine (EMLA) cream, Apply to affected area once, Disp: 30 g, Rfl: 3 .  lisinopril (PRINIVIL,ZESTRIL) 10 MG tablet, Take 10 mg by mouth daily., Disp: , Rfl:  .  LORazepam (ATIVAN) 0.5 MG tablet, Take 1 tablet (0.5 mg total) by mouth every 6 (six) hours as needed (Nausea or vomiting)., Disp: 30 tablet, Rfl: 0 .  Multiple Vitamin (MULTIVITAMIN) capsule, Take 1 capsule by mouth daily., Disp: , Rfl:  .  omeprazole (PRILOSEC) 20 MG  capsule, TAKE 1 CAPSULE BY MOUTH ONCE DAILY, Disp: 30 capsule, Rfl: 1 .  ondansetron (ZOFRAN) 8 MG tablet, Take 1 tablet (8 mg total) by mouth 2 (two) times daily as needed for refractory nausea / vomiting. Start on day 3 after cyclophosphamide chemotherapy. (Patient not taking: Reported on 10/18/2016), Disp: 30 tablet, Rfl: 1 .  predniSONE (DELTASONE) 50 MG tablet, Take 2 tablets (100 mg total) by mouth daily. Take on days 1-5 of each chemotherapy given every 3 weeks, Disp: 30 tablet, Rfl: 0 .  prochlorperazine (COMPAZINE) 10 MG tablet, Take 1 tablet (10 mg total) by mouth every 6 (six) hours as needed (Nausea or vomiting). (Patient not taking: Reported on 10/18/2016), Disp: 30 tablet, Rfl: 6  Physical exam: There were no vitals filed for this visit. Physical Exam  Constitutional: He is oriented to person, place, and time.  Fatigued. Appears in no acute distress  HENT:  Head: Normocephalic and atraumatic.  Eyes: Pupils are equal, round, and reactive to light. EOM are normal.  Neck: Normal range of motion.  Cardiovascular: Regular rhythm and normal heart sounds.   tachycardic  Pulmonary/Chest: Effort normal and breath sounds normal.  Abdominal: Soft. Bowel sounds are normal.  Neurological: He is alert and oriented to person, place, and time.  Skin: Skin is warm and dry.     CMP Latest Ref Rng & Units 11/08/2016  Glucose 65 - 99 mg/dL 326(H)  BUN 6 - 20 mg/dL 26(H)  Creatinine 0.61 - 1.24 mg/dL 1.29(H)  Sodium 135 - 145 mmol/L 135  Potassium 3.5 - 5.1 mmol/L 3.9  Chloride 101 - 111 mmol/L 98(L)  CO2 22 - 32 mmol/L 26  Calcium 8.9 - 10.3 mg/dL 9.1  Total Protein 6.5 - 8.1 g/dL 6.7  Total Bilirubin 0.3 - 1.2 mg/dL 0.7  Alkaline Phos 38 - 126 U/L 82  AST 15 - 41 U/L 27  ALT 17 - 63 U/L 22   CBC Latest Ref Rng & Units 11/08/2016  WBC 3.8 - 10.6 K/uL 9.7  Hemoglobin 13.0 - 18.0 g/dL 10.8(L)  Hematocrit 40.0 - 52.0 % 30.8(L)  Platelets 150 - 440 K/uL 365    No images are attached to  the encounter.  Nm Pet Image Restag (ps) Skull Base To Thigh  Result Date: 11/01/2016 CLINICAL DATA:  Subsequent treatment strategy for diffuse large B-cell lymphoma. EXAM: NUCLEAR MEDICINE PET SKULL BASE TO THIGH TECHNIQUE: 12.8 mCi F-18 FDG was injected intravenously. Full-ring PET imaging was performed from the skull base to thigh after the radiotracer. CT data was obtained and used for attenuation correction and anatomic localization. FASTING BLOOD GLUCOSE:  Value: 189 mg/dl  COMPARISON:  CT chest dated 09/14/2016.  PET-CT dated 08/03/2016. FINDINGS: NECK: No hypermetabolic cervical lymphadenopathy. CHEST: No hypermetabolic thoracic lymphadenopathy. No suspicious pulmonary nodules. The heart is normal in size. No thoracic aortic aneurysm. Atherosclerotic calcifications aortic arch. Coronary atherosclerosis of the LAD and left circumflex. Right chest port terminates the cavoatrial junction. ABDOMEN/PELVIS: 2.2 x 5.3 cm mesenteric nodal mass in the anterior mid abdomen (series 3/ image 204) with max SUV 2.6, previously 4.5 x 9.3 cm in aggregate with max SUV 11.5. Additional small jejunal mesentery lymph nodes. Spleen is normal in size. Status post left hemicolectomy with suture line in the left lower pelvis (series 3/ image 253). Status post prostatectomy. Atherosclerotic calcifications of the abdominal aorta and branch vessels. No evidence of abdominal aortic aneurysm. Mild hepatic steatosis. SKELETON: No focal hypermetabolic activity to suggest skeletal metastasis. Mild diffuse hypermetabolism, particularly involving the pelvis. IMPRESSION: 2.2 x 5.3 cm jejunal mesenteric nodal mass along the anterior mid abdomen, significantly improved. Otherwise, no findings suspicious for active lymphoma. Electronically Signed   By: Julian Hy M.D.   On: 11/01/2016 12:15     Assessment and plan- Patient is a 76 y.o. male here for consideration of cycle 5 of RCHOP.  Hypotension- will give him 1L NS today.  Arrange for fluids next week as well. He will hold off on taking his BP meds at this point and check daily BP. If systolic BP >413, he can restart lisinopril but still hold the HCTZ. No overt signs of sepsis. Hypotension/ tachycardia likely due to dehydration  Counts ok to proceed with ccyle 5 of RCHOP today. RTC in 3 weeks with cbc, cmp for cycle 6. Repeat PET CT after 6 cycles  Chemo induced anemia- stable continue to monitor  Weight loss- he has lost 10 pounds of weight over last 2 months. Encouraged him to use glucerna 3 times a day  RTC in 1 week for fluids and 3 weeks for cycle 6 of RCHOP plus onpro neulasta    Visit Diagnosis 1. Diffuse large B-cell lymphoma of intra-abdominal lymph nodes ([place])   2. Encounter for antineoplastic chemotherapy   3. Encounter for monitoring rituximab therapy   4. Antineoplastic chemotherapy induced anemia   5. Hypotension, unspecified hypotension type      Dr. Randa Evens, MD, MPH [place] at [facility] Pager- [phone] 9:22 AM

## 2016-11-29 ENCOUNTER — Other Ambulatory Visit: Payer: Self-pay | Admitting: Oncology

## 2016-11-29 ENCOUNTER — Inpatient Hospital Stay: Payer: Medicare HMO | Attending: Oncology | Admitting: Oncology

## 2016-11-29 ENCOUNTER — Encounter: Payer: Self-pay | Admitting: Oncology

## 2016-11-29 ENCOUNTER — Inpatient Hospital Stay: Payer: Medicare HMO

## 2016-11-29 VITALS — BP 111/72 | HR 98 | Resp 20

## 2016-11-29 VITALS — BP 99/67 | HR 94 | Temp 96.1°F | Wt 181.0 lb

## 2016-11-29 DIAGNOSIS — C61 Malignant neoplasm of prostate: Secondary | ICD-10-CM | POA: Diagnosis not present

## 2016-11-29 DIAGNOSIS — R630 Anorexia: Secondary | ICD-10-CM | POA: Diagnosis not present

## 2016-11-29 DIAGNOSIS — N183 Chronic kidney disease, stage 3 (moderate): Secondary | ICD-10-CM | POA: Diagnosis not present

## 2016-11-29 DIAGNOSIS — E119 Type 2 diabetes mellitus without complications: Secondary | ICD-10-CM | POA: Diagnosis not present

## 2016-11-29 DIAGNOSIS — E785 Hyperlipidemia, unspecified: Secondary | ICD-10-CM

## 2016-11-29 DIAGNOSIS — C8333 Diffuse large B-cell lymphoma, intra-abdominal lymph nodes: Secondary | ICD-10-CM | POA: Diagnosis not present

## 2016-11-29 DIAGNOSIS — Z87891 Personal history of nicotine dependence: Secondary | ICD-10-CM | POA: Diagnosis not present

## 2016-11-29 DIAGNOSIS — I129 Hypertensive chronic kidney disease with stage 1 through stage 4 chronic kidney disease, or unspecified chronic kidney disease: Secondary | ICD-10-CM | POA: Diagnosis not present

## 2016-11-29 DIAGNOSIS — I2510 Atherosclerotic heart disease of native coronary artery without angina pectoris: Secondary | ICD-10-CM | POA: Diagnosis not present

## 2016-11-29 DIAGNOSIS — I951 Orthostatic hypotension: Secondary | ICD-10-CM

## 2016-11-29 DIAGNOSIS — Z79899 Other long term (current) drug therapy: Secondary | ICD-10-CM

## 2016-11-29 DIAGNOSIS — I959 Hypotension, unspecified: Secondary | ICD-10-CM

## 2016-11-29 DIAGNOSIS — I700 Atherosclerosis of aorta: Secondary | ICD-10-CM | POA: Diagnosis not present

## 2016-11-29 DIAGNOSIS — M129 Arthropathy, unspecified: Secondary | ICD-10-CM | POA: Diagnosis not present

## 2016-11-29 DIAGNOSIS — R140 Abdominal distension (gaseous): Secondary | ICD-10-CM

## 2016-11-29 DIAGNOSIS — Z7689 Persons encountering health services in other specified circumstances: Secondary | ICD-10-CM | POA: Diagnosis not present

## 2016-11-29 DIAGNOSIS — D6481 Anemia due to antineoplastic chemotherapy: Secondary | ICD-10-CM

## 2016-11-29 DIAGNOSIS — M7989 Other specified soft tissue disorders: Secondary | ICD-10-CM | POA: Insufficient documentation

## 2016-11-29 DIAGNOSIS — Z85828 Personal history of other malignant neoplasm of skin: Secondary | ICD-10-CM

## 2016-11-29 DIAGNOSIS — Z5181 Encounter for therapeutic drug level monitoring: Secondary | ICD-10-CM

## 2016-11-29 DIAGNOSIS — Z87442 Personal history of urinary calculi: Secondary | ICD-10-CM

## 2016-11-29 DIAGNOSIS — T451X5A Adverse effect of antineoplastic and immunosuppressive drugs, initial encounter: Secondary | ICD-10-CM

## 2016-11-29 DIAGNOSIS — Z5112 Encounter for antineoplastic immunotherapy: Secondary | ICD-10-CM | POA: Insufficient documentation

## 2016-11-29 DIAGNOSIS — R531 Weakness: Secondary | ICD-10-CM | POA: Diagnosis not present

## 2016-11-29 DIAGNOSIS — R634 Abnormal weight loss: Secondary | ICD-10-CM

## 2016-11-29 DIAGNOSIS — Z85038 Personal history of other malignant neoplasm of large intestine: Secondary | ICD-10-CM

## 2016-11-29 DIAGNOSIS — R5383 Other fatigue: Secondary | ICD-10-CM

## 2016-11-29 DIAGNOSIS — Z923 Personal history of irradiation: Secondary | ICD-10-CM | POA: Diagnosis not present

## 2016-11-29 DIAGNOSIS — Z7982 Long term (current) use of aspirin: Secondary | ICD-10-CM

## 2016-11-29 DIAGNOSIS — R599 Enlarged lymph nodes, unspecified: Secondary | ICD-10-CM

## 2016-11-29 DIAGNOSIS — Z79620 Long term (current) use of immunosuppressive biologic: Secondary | ICD-10-CM

## 2016-11-29 DIAGNOSIS — Z5111 Encounter for antineoplastic chemotherapy: Secondary | ICD-10-CM | POA: Diagnosis not present

## 2016-11-29 LAB — CBC WITH DIFFERENTIAL/PLATELET
BASOS PCT: 2 %
Basophils Absolute: 0.1 10*3/uL (ref 0–0.1)
EOS ABS: 0.1 10*3/uL (ref 0–0.7)
EOS PCT: 1 %
HCT: 30.8 % — ABNORMAL LOW (ref 40.0–52.0)
Hemoglobin: 10.5 g/dL — ABNORMAL LOW (ref 13.0–18.0)
Lymphocytes Relative: 8 %
Lymphs Abs: 0.6 10*3/uL — ABNORMAL LOW (ref 1.0–3.6)
MCH: 32.1 pg (ref 26.0–34.0)
MCHC: 34 g/dL (ref 32.0–36.0)
MCV: 94.4 fL (ref 80.0–100.0)
MONO ABS: 1.4 10*3/uL — AB (ref 0.2–1.0)
MONOS PCT: 18 %
NEUTROS PCT: 71 %
Neutro Abs: 5.6 10*3/uL (ref 1.4–6.5)
PLATELETS: 314 10*3/uL (ref 150–440)
RBC: 3.26 MIL/uL — ABNORMAL LOW (ref 4.40–5.90)
RDW: 15.4 % — AB (ref 11.5–14.5)
WBC: 7.8 10*3/uL (ref 3.8–10.6)

## 2016-11-29 LAB — COMPREHENSIVE METABOLIC PANEL
ALT: 28 U/L (ref 17–63)
AST: 28 U/L (ref 15–41)
Albumin: 3.2 g/dL — ABNORMAL LOW (ref 3.5–5.0)
Alkaline Phosphatase: 79 U/L (ref 38–126)
Anion gap: 13 (ref 5–15)
BUN: 29 mg/dL — ABNORMAL HIGH (ref 6–20)
CO2: 25 mmol/L (ref 22–32)
CREATININE: 1.16 mg/dL (ref 0.61–1.24)
Calcium: 9.3 mg/dL (ref 8.9–10.3)
Chloride: 99 mmol/L — ABNORMAL LOW (ref 101–111)
GFR calc non Af Amer: >60 mL/min (ref >60)
GLUCOSE: 221 mg/dL — AB (ref 65–99)
Potassium: 3.8 mmol/L (ref 3.5–5.1)
SODIUM: 137 mmol/L (ref 135–145)
Total Bilirubin: 0.7 mg/dL (ref 0.3–1.2)
Total Protein: 6.4 g/dL — ABNORMAL LOW (ref 6.5–8.1)

## 2016-11-29 LAB — IRON AND TIBC
IRON: 45 ug/dL (ref 45–182)
Saturation Ratios: 19 % (ref 17.9–39.5)
TIBC: 235 ug/dL — AB (ref 250–450)
UIBC: 190 ug/dL

## 2016-11-29 LAB — FOLATE: FOLATE: 29 ng/mL (ref >5.9)

## 2016-11-29 LAB — FERRITIN: Ferritin: 1422 ng/mL — ABNORMAL HIGH (ref 24–336)

## 2016-11-29 LAB — VITAMIN B12: Vitamin B-12: 2476 pg/mL — ABNORMAL HIGH (ref 180–914)

## 2016-11-29 MED ORDER — HEPARIN SOD (PORK) LOCK FLUSH 100 UNIT/ML IV SOLN
500.0000 [IU] | Freq: Once | INTRAVENOUS | Status: AC
  Administered 2016-11-29: 500 [IU] via INTRAVENOUS
  Filled 2016-11-29: qty 5

## 2016-11-29 MED ORDER — PALONOSETRON HCL INJECTION 0.25 MG/5ML
0.2500 mg | Freq: Once | INTRAVENOUS | Status: AC
  Administered 2016-11-29: 0.25 mg via INTRAVENOUS
  Filled 2016-11-29: qty 5

## 2016-11-29 MED ORDER — ACETAMINOPHEN 325 MG PO TABS
650.0000 mg | ORAL_TABLET | Freq: Once | ORAL | Status: AC
  Administered 2016-11-29: 650 mg via ORAL
  Filled 2016-11-29: qty 2

## 2016-11-29 MED ORDER — SODIUM CHLORIDE 0.9 % IV SOLN
375.0000 mg/m2 | Freq: Once | INTRAVENOUS | Status: AC
  Administered 2016-11-29: 800 mg via INTRAVENOUS
  Filled 2016-11-29: qty 50

## 2016-11-29 MED ORDER — SODIUM CHLORIDE 0.9 % IV SOLN
750.0000 mg/m2 | Freq: Once | INTRAVENOUS | Status: AC
  Administered 2016-11-29: 1600 mg via INTRAVENOUS
  Filled 2016-11-29: qty 80

## 2016-11-29 MED ORDER — SODIUM CHLORIDE 0.9% FLUSH
10.0000 mL | INTRAVENOUS | Status: DC | PRN
  Administered 2016-11-29: 10 mL via INTRAVENOUS
  Filled 2016-11-29: qty 10

## 2016-11-29 MED ORDER — DOXORUBICIN HCL CHEMO IV INJECTION 2 MG/ML
100.0000 mg | Freq: Once | INTRAVENOUS | Status: AC
  Administered 2016-11-29: 100 mg via INTRAVENOUS
  Filled 2016-11-29: qty 50

## 2016-11-29 MED ORDER — PEGFILGRASTIM 6 MG/0.6ML ~~LOC~~ PSKT
6.0000 mg | PREFILLED_SYRINGE | Freq: Once | SUBCUTANEOUS | Status: AC
  Administered 2016-11-29: 6 mg via SUBCUTANEOUS
  Filled 2016-11-29: qty 0.6

## 2016-11-29 MED ORDER — SODIUM CHLORIDE 0.9 % IV SOLN
Freq: Once | INTRAVENOUS | Status: AC
  Administered 2016-11-29: 09:00:00 via INTRAVENOUS
  Filled 2016-11-29: qty 1000

## 2016-11-29 MED ORDER — SODIUM CHLORIDE 0.9 % IV SOLN: 375.0000 mg/m2 | Freq: Once | INTRAVENOUS | Status: DC

## 2016-11-29 MED ORDER — VINCRISTINE SULFATE CHEMO INJECTION 1 MG/ML
2.0000 mg | Freq: Once | INTRAVENOUS | Status: AC
  Administered 2016-11-29: 2 mg via INTRAVENOUS
  Filled 2016-11-29: qty 2

## 2016-11-29 MED ORDER — DIPHENHYDRAMINE HCL 25 MG PO CAPS
50.0000 mg | ORAL_CAPSULE | Freq: Once | ORAL | Status: AC
  Administered 2016-11-29: 50 mg via ORAL
  Filled 2016-11-29: qty 2

## 2016-11-29 MED ORDER — SODIUM CHLORIDE 0.9 % IV SOLN
Freq: Once | INTRAVENOUS | Status: AC
  Administered 2016-11-29: 11:00:00 via INTRAVENOUS
  Filled 2016-11-29: qty 1000

## 2016-11-29 MED ORDER — DEXAMETHASONE SODIUM PHOSPHATE 10 MG/ML IJ SOLN
10.0000 mg | Freq: Once | INTRAMUSCULAR | Status: AC
  Administered 2016-11-29: 10 mg via INTRAVENOUS
  Filled 2016-11-29: qty 1

## 2016-11-29 MED ORDER — SODIUM CHLORIDE 0.9 % IV SOLN
INTRAVENOUS | Status: DC
Start: 2016-11-29 — End: 2016-11-29
  Filled 2016-11-29: qty 1000

## 2016-11-29 NOTE — Progress Notes (Signed)
Patient states that he has been feeling awful the last couple of weeks. He really feels bad when he is up and moving around. His blood pressure has been running on the low side, when rechecked today it was 92/65. He is taking lisinopril and HCTZ, which he has been on for many years. Standing BP was 89/63 and pulse went up to 135. He feels tired all the time and is having stomach issues, including bloating and indigestion. He has appointment with Dr. Raechel Ache on Friday for follow up.

## 2016-12-06 ENCOUNTER — Inpatient Hospital Stay: Payer: Medicare HMO

## 2016-12-19 NOTE — Progress Notes (Signed)
Hematology/Oncology Consult note [facility]  Telephone:([phone] [phone]  Patient Care Team: Ezequiel Kayser, MD as PCP - General (Internal Medicine) Hollice Espy, MD as Consulting Physician (Urology)   Name of the patient: Christian Kelly  [id number]  1940-05-15   Date of visit: 12/19/16  Diagnosis- 1. 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 Stage II DLBCL GCB. FISH for double hit negative  Chief complaint/ Reason for visit- on treatment assessment prior to cycle 5 of RCHOP  Heme/Onc history: 1. Patient is a 76 yr old male with a h/o prostate cancer diagnosed in 1999s/p radical prostatectomy. Prior to that he had colon cancer in 1998 s/o surgery and adjuvant chemotherapy in 1998. With regards to prostate cancer- surgery was done at [facility] in [place] and he was followed at [place] subsequently. Patient think his Gleasons score was 7 at diagnosis. He has not required any radiation therapy or ADT so far. Patient still spends 4-5 months at [place].   2. Dr. Raechel Ache has been monitoring his PSArecently and his recent trend of PSA has been as follows: psa was 0.33 in feb 2017 and 0.63 in April 2018. We have 1 psa from 2015 when it was 0.3  3. Patient was referred to Dr. Erlene Quan urology and Dr. Baruch Gouty for salvage radiation.   4. Dr. Erlene Quan ordered CT abdomen which showed: IMPRESSION: 1. Status post prostatectomy, without locally recurrent disease. 2. Extensive abdominal adenopathy. Given the clinical history, most likely related to metastatic prostate carcinoma. Lymphoma could look similar. 3. Coronary artery atherosclerosis. Aortic atherosclerosis. 4. Vague upper sacral sclerosis is felt unlikely to represent metastatic disease, given lack of correlate on yesterday's bone scan. Correlate with radiation therapy, as radiation induced necrosis could have this appearance. Recommend attention  on follow-up.  5. This was followed by PET/CT scan which showed: IMPRESSION: 1. Hypermetabolic gastrohepatic ligament, abdominal retroperitoneal and small bowel mesenteric adenopathy, most indicative of lymphoma. 2. Aortic atherosclerosis (ICD10-170.0). Coronary artery Calcification.  6. Patient underwent CT-guided biopsy of the mesenteric lymph node which showed:DIAGNOSIS:  A. LYMPH NODE, MESENTERIC; CT-GUIDED CORE BIOPSY:  - LARGE B-CELL LYMPHOMA, CD10 POSITIVE.   Comment:  There is a diffuse proliferation of large lymphocytes with irregular  nuclear contours, inconspicuous nucleoli, and scant cytoplasm. The flow  cytometry and IHC results are most consistent with diffuse large B-cell  lymphoma, not otherwise specified, germinal center B-cell type. However,  it is likely that [place] for MYC, BCL2 and BCL6 gene rearrangements will  be ordered, so final classification will be based on the [place] result  7. Bone marrow biopsy was negative for lymphoma  8. 17% of nuclei were positive for BCL-2 rearrangement. 35% of nuclei had extra myc signals but no gene rearrangement for cmyc noted. This was consistent with evolved lymphoma or DLBCL. Not consistentwithwith double hit or double expressor  9. Interim scans after 3 cycles of RCHOP showed: IMPRESSION: 2.2 x 5.3 cm jejunal mesenteric nodal mass along the anterior mid abdomen, significantly improved.   Interval history- BP running in 120's after lisinopril was stopped. He did start back on HCTZ due to leg swelling. Feels tired. Denies other complaints  ECOG PS- 1 Pain scale- 0 Opioid associated constipation- 0  Review of systems- Review of Systems  Constitutional: Positive for malaise/fatigue. Negative for chills, fever and weight loss.  HENT: Negative for congestion, ear discharge and nosebleeds.   Eyes: Negative for blurred vision.  Respiratory: Negative for cough, hemoptysis, sputum production, shortness  of breath and  wheezing.   Cardiovascular: Negative for chest pain, palpitations, orthopnea and claudication.  Gastrointestinal: Negative for abdominal pain, blood in stool, constipation, diarrhea, heartburn, melena, nausea and vomiting.  Genitourinary: Negative for dysuria, flank pain, frequency, hematuria and urgency.  Musculoskeletal: Negative for back pain, joint pain and myalgias.  Skin: Negative for rash.  Neurological: Negative for dizziness, tingling, focal weakness, seizures, weakness and headaches.  Endo/Heme/Allergies: Does not bruise/bleed easily.  Psychiatric/Behavioral: Negative for depression and suicidal ideas. The patient does not have insomnia.       Allergies  Allergen Reactions  . Tape Rash     Past Medical History:  Diagnosis Date  . Arthritis   . Basal cell carcinoma 2008   forehead  . Cancer ([place])   . Chronic renal insufficiency, stage III (moderate) ([facility])   . Colon cancer ([place]) 1998  . Diabetes mellitus without complication ([place])    Controlled with diet only as of 11/01/2016  . History of kidney stones   . Hyperlipemia   . Hypertension   . Lyme disease   . Prostate cancer ([place]) 1999     Past Surgical History:  Procedure Laterality Date  . COLON SURGERY  1998  . COLONOSCOPY    . COLONOSCOPY WITH PROPOFOL N/A 12/29/2014   Procedure: COLONOSCOPY WITH PROPOFOL;  Surgeon: Lollie Sails, MD;  Location: [facility] ENDOSCOPY;  Service: Endoscopy;  Laterality: N/A;  . EYE SURGERY    . FRACTURE SURGERY    . IR FLUORO GUIDE PORT INSERTION RIGHT  09/01/2016  . LITHOTRIPSY    . PROSTATECTOMY  1998  . TONSILLECTOMY      Social History   Social History  . Marital status: Married    Spouse name: N/A  . Number of children: N/A  . Years of education: N/A   Occupational History  . Not on file.   Social History Main Topics  . Smoking status: Former Smoker    Packs/day: 1.00    Years: 8.00    Types: Cigarettes    Quit date: 08/13/1974  . Smokeless tobacco: Former Systems developer     Types: Snuff  . Alcohol use Yes     Comment: rarely-one or two beer  . Drug use: No  . Sexual activity: Not Currently   Other Topics Concern  . Not on file   Social History Narrative  . No narrative on file    Family History  Problem Relation Age of Onset  . Coronary artery disease Mother   . Diabetes Mother   . Prostate cancer Father   . Pancreatitis Father   . Bladder Cancer Sister   . Diabetes Brother   . Diabetes Brother   . Prostate cancer Brother   . Diabetes Brother   . Diabetes Maternal Grandmother      Current Outpatient Prescriptions:  .  ACCU-CHEK FASTCLIX LANCETS MISC, , Disp: , Rfl:  .  aspirin EC 81 MG tablet, Take 81 mg by mouth every other day. , Disp: , Rfl:  .  atorvastatin (LIPITOR) 40 MG tablet, Take 40 mg by mouth daily., Disp: , Rfl:  .  Blood Glucose Monitoring Suppl (TRUE METRIX AIR GLUCOSE METER) w/Device KIT, , Disp: , Rfl:  .  hydrochlorothiazide (HYDRODIURIL) 25 MG tablet, Take 25 mg by mouth daily., Disp: , Rfl:  .  lidocaine-prilocaine (EMLA) cream, Apply to affected area once, Disp: 30 g, Rfl: 3 .  lisinopril (PRINIVIL,ZESTRIL) 10 MG tablet, Take 10 mg by mouth daily., Disp: , Rfl:  .  LORazepam (ATIVAN) 0.5 MG tablet, Take 1 tablet (0.5 mg total) by mouth every 6 (six) hours as needed (Nausea or vomiting). (Patient not taking: Reported on 11/29/2016), Disp: 30 tablet, Rfl: 0 .  Multiple Vitamin (MULTIVITAMIN) capsule, Take 1 capsule by mouth daily., Disp: , Rfl:  .  omeprazole (PRILOSEC) 20 MG capsule, TAKE 1 CAPSULE BY MOUTH ONCE DAILY, Disp: 30 capsule, Rfl: 1 .  ondansetron (ZOFRAN) 8 MG tablet, Take 1 tablet (8 mg total) by mouth 2 (two) times daily as needed for refractory nausea / vomiting. Start on day 3 after cyclophosphamide chemotherapy., Disp: 30 tablet, Rfl: 1 .  predniSONE (DELTASONE) 50 MG tablet, Take 2 tablets (100 mg total) by mouth daily. Take on days 1-5 of each chemotherapy given every 3 weeks, Disp: 30 tablet, Rfl: 0 .   prochlorperazine (COMPAZINE) 10 MG tablet, Take 1 tablet (10 mg total) by mouth every 6 (six) hours as needed (Nausea or vomiting). (Patient not taking: Reported on 10/18/2016), Disp: 30 tablet, Rfl: 6  Physical exam:  Vitals:   12/20/16 0851  BP: 125/78  Pulse: 92  Resp: 14  Temp: (!) 97.2 F (36.2 C)  TempSrc: Tympanic  Weight: 185 lb (83.9 kg)   Physical Exam  Constitutional: He is oriented to person, place, and time and well-developed, well-nourished, and in no distress.  HENT:  Head: Normocephalic and atraumatic.  Eyes: Pupils are equal, round, and reactive to light. EOM are normal.  Neck: Normal range of motion.  Cardiovascular: Normal rate, regular rhythm and normal heart sounds.   Pulmonary/Chest: Effort normal and breath sounds normal.  Abdominal: Soft. Bowel sounds are normal.  Neurological: He is alert and oriented to person, place, and time.  Skin: Skin is warm and dry.     CMP Latest Ref Rng & Units 12/20/2016  Glucose 65 - 99 mg/dL 213(H)  BUN 6 - 20 mg/dL 17  Creatinine 0.61 - 1.24 mg/dL 1.00  Sodium 135 - 145 mmol/L 139  Potassium 3.5 - 5.1 mmol/L 3.2(L)  Chloride 101 - 111 mmol/L 103  CO2 22 - 32 mmol/L 26  Calcium 8.9 - 10.3 mg/dL 8.7(L)  Total Protein 6.5 - 8.1 g/dL 6.1(L)  Total Bilirubin 0.3 - 1.2 mg/dL 0.7  Alkaline Phos 38 - 126 U/L 98  AST 15 - 41 U/L 32  ALT 17 - 63 U/L 26   CBC Latest Ref Rng & Units 12/20/2016  WBC 3.8 - 10.6 K/uL 6.1  Hemoglobin 13.0 - 18.0 g/dL 10.3(L)  Hematocrit 40.0 - 52.0 % 30.4(L)  Platelets 150 - 440 K/uL 232     Assessment and plan- Patient is a 76 y.o. male here for consideration of cycle 6 of RCHOP.  Counts ok to proceed with cycle 6 of RCHOP chemotherapy today. This will be his last cycle. Will get a repeat PET/CT 2 weeks from now.  I will see him back in 3 weeks with cbc, cmp and PSA  Prostate cancer- repeat PSA in 2 weeks. He has evidence of slowly rising PSA. No evidence of metastatic prostate cancer. He  is s/p prostatectomy and will be a canddiate for salvage radiation therapy with Dr. Baruch Gouty  Chemo induced anemia- hb stable around 10. Continue to monitor   Visit Diagnosis 1. Diffuse large B-cell lymphoma of intra-abdominal lymph nodes ([place])   2. Encounter for antineoplastic chemotherapy   3. Visit for monitoring Rituxan therapy      Dr. Randa Evens, MD, MPH [facility] at [facility] Pager- [phone]  12/20/2016 .9:47 AM

## 2016-12-20 ENCOUNTER — Inpatient Hospital Stay: Payer: Medicare HMO

## 2016-12-20 ENCOUNTER — Encounter: Payer: Self-pay | Admitting: Oncology

## 2016-12-20 ENCOUNTER — Inpatient Hospital Stay (HOSPITAL_BASED_OUTPATIENT_CLINIC_OR_DEPARTMENT_OTHER): Payer: Medicare HMO | Admitting: Oncology

## 2016-12-20 VITALS — BP 125/78 | HR 92 | Temp 97.2°F | Resp 14 | Wt 185.0 lb

## 2016-12-20 DIAGNOSIS — C61 Malignant neoplasm of prostate: Secondary | ICD-10-CM

## 2016-12-20 DIAGNOSIS — D6481 Anemia due to antineoplastic chemotherapy: Secondary | ICD-10-CM | POA: Diagnosis not present

## 2016-12-20 DIAGNOSIS — Z5181 Encounter for therapeutic drug level monitoring: Secondary | ICD-10-CM

## 2016-12-20 DIAGNOSIS — I129 Hypertensive chronic kidney disease with stage 1 through stage 4 chronic kidney disease, or unspecified chronic kidney disease: Secondary | ICD-10-CM

## 2016-12-20 DIAGNOSIS — C8333 Diffuse large B-cell lymphoma, intra-abdominal lymph nodes: Secondary | ICD-10-CM

## 2016-12-20 DIAGNOSIS — R634 Abnormal weight loss: Secondary | ICD-10-CM

## 2016-12-20 DIAGNOSIS — R599 Enlarged lymph nodes, unspecified: Secondary | ICD-10-CM

## 2016-12-20 DIAGNOSIS — R630 Anorexia: Secondary | ICD-10-CM | POA: Diagnosis not present

## 2016-12-20 DIAGNOSIS — R140 Abdominal distension (gaseous): Secondary | ICD-10-CM | POA: Diagnosis not present

## 2016-12-20 DIAGNOSIS — Z5112 Encounter for antineoplastic immunotherapy: Secondary | ICD-10-CM | POA: Diagnosis not present

## 2016-12-20 DIAGNOSIS — N183 Chronic kidney disease, stage 3 (moderate): Secondary | ICD-10-CM

## 2016-12-20 DIAGNOSIS — M7989 Other specified soft tissue disorders: Secondary | ICD-10-CM

## 2016-12-20 DIAGNOSIS — Z5111 Encounter for antineoplastic chemotherapy: Secondary | ICD-10-CM

## 2016-12-20 DIAGNOSIS — I700 Atherosclerosis of aorta: Secondary | ICD-10-CM

## 2016-12-20 DIAGNOSIS — Z7982 Long term (current) use of aspirin: Secondary | ICD-10-CM

## 2016-12-20 DIAGNOSIS — Z923 Personal history of irradiation: Secondary | ICD-10-CM

## 2016-12-20 DIAGNOSIS — Z7689 Persons encountering health services in other specified circumstances: Secondary | ICD-10-CM | POA: Diagnosis not present

## 2016-12-20 DIAGNOSIS — I2510 Atherosclerotic heart disease of native coronary artery without angina pectoris: Secondary | ICD-10-CM

## 2016-12-20 DIAGNOSIS — Z87442 Personal history of urinary calculi: Secondary | ICD-10-CM

## 2016-12-20 DIAGNOSIS — E119 Type 2 diabetes mellitus without complications: Secondary | ICD-10-CM

## 2016-12-20 DIAGNOSIS — R531 Weakness: Secondary | ICD-10-CM

## 2016-12-20 DIAGNOSIS — I959 Hypotension, unspecified: Secondary | ICD-10-CM

## 2016-12-20 DIAGNOSIS — R5383 Other fatigue: Secondary | ICD-10-CM

## 2016-12-20 DIAGNOSIS — M129 Arthropathy, unspecified: Secondary | ICD-10-CM

## 2016-12-20 DIAGNOSIS — E785 Hyperlipidemia, unspecified: Secondary | ICD-10-CM

## 2016-12-20 DIAGNOSIS — Z79899 Other long term (current) drug therapy: Secondary | ICD-10-CM

## 2016-12-20 DIAGNOSIS — Z87891 Personal history of nicotine dependence: Secondary | ICD-10-CM

## 2016-12-20 DIAGNOSIS — Z85828 Personal history of other malignant neoplasm of skin: Secondary | ICD-10-CM

## 2016-12-20 DIAGNOSIS — Z85038 Personal history of other malignant neoplasm of large intestine: Secondary | ICD-10-CM

## 2016-12-20 LAB — CBC WITH DIFFERENTIAL/PLATELET
Basophils Absolute: 0.1 10*3/uL (ref 0–0.1)
Basophils Relative: 2 %
Eosinophils Absolute: 0 10*3/uL (ref 0–0.7)
Eosinophils Relative: 1 %
HCT: 30.4 % — ABNORMAL LOW (ref 40.0–52.0)
HEMOGLOBIN: 10.3 g/dL — AB (ref 13.0–18.0)
LYMPHS PCT: 12 %
Lymphs Abs: 0.7 10*3/uL — ABNORMAL LOW (ref 1.0–3.6)
MCH: 32.1 pg (ref 26.0–34.0)
MCHC: 33.8 g/dL (ref 32.0–36.0)
MCV: 94.9 fL (ref 80.0–100.0)
MONOS PCT: 16 %
Monocytes Absolute: 1 10*3/uL (ref 0.2–1.0)
NEUTROS ABS: 4.2 10*3/uL (ref 1.4–6.5)
NEUTROS PCT: 69 %
Platelets: 232 10*3/uL (ref 150–440)
RBC: 3.2 MIL/uL — ABNORMAL LOW (ref 4.40–5.90)
RDW: 15.5 % — ABNORMAL HIGH (ref 11.5–14.5)
WBC: 6.1 10*3/uL (ref 3.8–10.6)

## 2016-12-20 LAB — COMPREHENSIVE METABOLIC PANEL
ALT: 26 U/L (ref 17–63)
AST: 32 U/L (ref 15–41)
Albumin: 3.3 g/dL — ABNORMAL LOW (ref 3.5–5.0)
Alkaline Phosphatase: 98 U/L (ref 38–126)
Anion gap: 10 (ref 5–15)
BUN: 17 mg/dL (ref 6–20)
CHLORIDE: 103 mmol/L (ref 101–111)
CO2: 26 mmol/L (ref 22–32)
Calcium: 8.7 mg/dL — ABNORMAL LOW (ref 8.9–10.3)
Creatinine, Ser: 1 mg/dL (ref 0.61–1.24)
Glucose, Bld: 213 mg/dL — ABNORMAL HIGH (ref 65–99)
POTASSIUM: 3.2 mmol/L — AB (ref 3.5–5.1)
SODIUM: 139 mmol/L (ref 135–145)
Total Bilirubin: 0.7 mg/dL (ref 0.3–1.2)
Total Protein: 6.1 g/dL — ABNORMAL LOW (ref 6.5–8.1)

## 2016-12-20 MED ORDER — SODIUM CHLORIDE 0.9 % IV SOLN
375.0000 mg/m2 | Freq: Once | INTRAVENOUS | Status: AC
  Administered 2016-12-20: 800 mg via INTRAVENOUS
  Filled 2016-12-20: qty 50

## 2016-12-20 MED ORDER — DOXORUBICIN HCL CHEMO IV INJECTION 2 MG/ML
100.0000 mg | Freq: Once | INTRAVENOUS | Status: AC
  Administered 2016-12-20: 100 mg via INTRAVENOUS
  Filled 2016-12-20: qty 50

## 2016-12-20 MED ORDER — SODIUM CHLORIDE 0.9 % IV SOLN
Freq: Once | INTRAVENOUS | Status: AC
  Administered 2016-12-20: 10:00:00 via INTRAVENOUS
  Filled 2016-12-20: qty 1000

## 2016-12-20 MED ORDER — PALONOSETRON HCL INJECTION 0.25 MG/5ML
0.2500 mg | Freq: Once | INTRAVENOUS | Status: AC
  Administered 2016-12-20: 0.25 mg via INTRAVENOUS
  Filled 2016-12-20: qty 5

## 2016-12-20 MED ORDER — RITUXIMAB CHEMO INJECTION 500 MG/50ML
375.0000 mg/m2 | Freq: Once | INTRAVENOUS | Status: DC
Start: 2016-12-20 — End: 2016-12-20

## 2016-12-20 MED ORDER — SODIUM CHLORIDE 0.9 % IV SOLN: 10.0000 mg | Freq: Once | INTRAVENOUS | Status: DC

## 2016-12-20 MED ORDER — DEXAMETHASONE SODIUM PHOSPHATE 10 MG/ML IJ SOLN
10.0000 mg | Freq: Once | INTRAMUSCULAR | Status: AC
  Administered 2016-12-20: 10 mg via INTRAVENOUS

## 2016-12-20 MED ORDER — DEXAMETHASONE SODIUM PHOSPHATE 10 MG/ML IJ SOLN
10.0000 mg | Freq: Once | INTRAMUSCULAR | Status: DC
  Filled 2016-12-20: qty 1

## 2016-12-20 MED ORDER — DIPHENHYDRAMINE HCL 25 MG PO CAPS
50.0000 mg | ORAL_CAPSULE | Freq: Once | ORAL | Status: AC
  Administered 2016-12-20: 50 mg via ORAL
  Filled 2016-12-20: qty 2

## 2016-12-20 MED ORDER — HEPARIN SOD (PORK) LOCK FLUSH 100 UNIT/ML IV SOLN
500.0000 [IU] | Freq: Once | INTRAVENOUS | Status: AC
  Administered 2016-12-20: 500 [IU] via INTRAVENOUS
  Filled 2016-12-20: qty 5

## 2016-12-20 MED ORDER — VINCRISTINE SULFATE CHEMO INJECTION 1 MG/ML
2.0000 mg | Freq: Once | INTRAVENOUS | Status: AC
  Administered 2016-12-20: 2 mg via INTRAVENOUS
  Filled 2016-12-20: qty 2

## 2016-12-20 MED ORDER — PEGFILGRASTIM 6 MG/0.6ML ~~LOC~~ PSKT
6.0000 mg | PREFILLED_SYRINGE | Freq: Once | SUBCUTANEOUS | Status: AC
  Administered 2016-12-20: 6 mg via SUBCUTANEOUS
  Filled 2016-12-20: qty 0.6

## 2016-12-20 MED ORDER — SODIUM CHLORIDE 0.9% FLUSH
10.0000 mL | INTRAVENOUS | Status: DC | PRN
  Administered 2016-12-20: 10 mL via INTRAVENOUS
  Filled 2016-12-20: qty 10

## 2016-12-20 MED ORDER — SODIUM CHLORIDE 0.9 % IV SOLN
750.0000 mg/m2 | Freq: Once | INTRAVENOUS | Status: AC
  Administered 2016-12-20: 1600 mg via INTRAVENOUS
  Filled 2016-12-20: qty 80

## 2016-12-20 MED ORDER — ACETAMINOPHEN 325 MG PO TABS
650.0000 mg | ORAL_TABLET | Freq: Once | ORAL | Status: AC
  Administered 2016-12-20: 650 mg via ORAL
  Filled 2016-12-20: qty 2

## 2016-12-20 NOTE — Progress Notes (Signed)
Patient is here today for follow up with labs and treatment with RCHOP. He states that he is feeling well today and denies having any pain.

## 2017-01-03 DIAGNOSIS — L821 Other seborrheic keratosis: Secondary | ICD-10-CM | POA: Diagnosis not present

## 2017-01-03 DIAGNOSIS — Z85828 Personal history of other malignant neoplasm of skin: Secondary | ICD-10-CM | POA: Diagnosis not present

## 2017-01-03 DIAGNOSIS — Z86018 Personal history of other benign neoplasm: Secondary | ICD-10-CM | POA: Diagnosis not present

## 2017-01-03 DIAGNOSIS — L578 Other skin changes due to chronic exposure to nonionizing radiation: Secondary | ICD-10-CM | POA: Diagnosis not present

## 2017-01-04 ENCOUNTER — Ambulatory Visit
Admission: RE | Admit: 2017-01-04 | Discharge: 2017-01-04 | Disposition: A | Discharge status: Home / Self Care | Payer: Medicare HMO | Source: Ambulatory Visit | Attending: Oncology | Admitting: Oncology

## 2017-01-04 DIAGNOSIS — R918 Other nonspecific abnormal finding of lung field: Secondary | ICD-10-CM | POA: Diagnosis not present

## 2017-01-04 DIAGNOSIS — C8333 Diffuse large B-cell lymphoma, intra-abdominal lymph nodes: Secondary | ICD-10-CM | POA: Diagnosis not present

## 2017-01-04 DIAGNOSIS — I700 Atherosclerosis of aorta: Secondary | ICD-10-CM | POA: Insufficient documentation

## 2017-01-04 DIAGNOSIS — I2510 Atherosclerotic heart disease of native coronary artery without angina pectoris: Secondary | ICD-10-CM | POA: Insufficient documentation

## 2017-01-04 DIAGNOSIS — C8330 Diffuse large B-cell lymphoma, unspecified site: Secondary | ICD-10-CM | POA: Diagnosis not present

## 2017-01-04 LAB — GLUCOSE, CAPILLARY
GLUCOSE-CAPILLARY: 188 mg/dL — AB (ref 65–99)
GLUCOSE-CAPILLARY: 201 mg/dL — AB (ref 65–99)

## 2017-01-04 MED ORDER — FLUDEOXYGLUCOSE F - 18 (FDG) INJECTION
12.5200 | Freq: Once | INTRAVENOUS | Status: AC | PRN
  Administered 2017-01-04: 12.52 via INTRAVENOUS

## 2017-01-06 DIAGNOSIS — E876 Hypokalemia: Secondary | ICD-10-CM | POA: Diagnosis not present

## 2017-01-06 DIAGNOSIS — N182 Chronic kidney disease, stage 2 (mild): Secondary | ICD-10-CM | POA: Diagnosis not present

## 2017-01-06 DIAGNOSIS — E782 Mixed hyperlipidemia: Secondary | ICD-10-CM | POA: Diagnosis not present

## 2017-01-06 DIAGNOSIS — D126 Benign neoplasm of colon, unspecified: Secondary | ICD-10-CM | POA: Diagnosis not present

## 2017-01-06 DIAGNOSIS — Z23 Encounter for immunization: Secondary | ICD-10-CM | POA: Diagnosis not present

## 2017-01-06 DIAGNOSIS — Z79899 Other long term (current) drug therapy: Secondary | ICD-10-CM | POA: Diagnosis not present

## 2017-01-06 DIAGNOSIS — Z0000 Encounter for general adult medical examination without abnormal findings: Secondary | ICD-10-CM | POA: Diagnosis not present

## 2017-01-06 DIAGNOSIS — I10 Essential (primary) hypertension: Secondary | ICD-10-CM | POA: Diagnosis not present

## 2017-01-06 DIAGNOSIS — E1122 Type 2 diabetes mellitus with diabetic chronic kidney disease: Secondary | ICD-10-CM | POA: Diagnosis not present

## 2017-01-09 NOTE — Progress Notes (Signed)
Hematology/Oncology Consult note [facility]  Telephone:([phone] [phone]  Patient Care Team: Ezequiel Kayser, MD as PCP - General (Internal Medicine) Hollice Espy, MD as Consulting Physician (Urology)   Name of the patient: Christian Kelly  [id number]  1941/01/16   Date of visit: 01/09/17  Diagnosis- 1. 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 Stage II DLBCL GCB. FISH for double hit negative  Chief complaint/ Reason for visit- on treatment assessment prior to cycle 5of RCHOP  Heme/Onc history:1. Patient is a 76 yr old male with a h/o prostate cancer diagnosed in 1999s/p radical prostatectomy. Prior to that he had colon cancer in 1998 s/o surgery and adjuvant chemotherapy in 1998. With regards to prostate cancer- surgery was done at [facility] in [place] and he was followed at [place] subsequently. Patient think his Gleasons score was 7 at diagnosis. He has not required any radiation therapy or ADT so far. Patient still spends 4-5 months at [place].   2. Dr. Raechel Ache has been monitoring his PSArecently and his recent trend of PSA has been as follows: psa was 0.33 in feb 2017 and 0.63 in April 2018. We have 1 psa from 2015 when it was 0.3  3. Patient was referred to Dr. Erlene Quan urology and Dr. Baruch Gouty for salvage radiation.   4. Dr. Erlene Quan ordered CT abdomen which showed: IMPRESSION: 1. Status post prostatectomy, without locally recurrent disease. 2. Extensive abdominal adenopathy. Given the clinical history, most likely related to metastatic prostate carcinoma. Lymphoma could look similar. 3. Coronary artery atherosclerosis. Aortic atherosclerosis. 4. Vague upper sacral sclerosis is felt unlikely to represent metastatic disease, given lack of correlate on yesterday's bone scan. Correlate with radiation therapy, as radiation induced necrosis could have this appearance. Recommend attention  on follow-up.  5. This was followed by PET/CT scan which showed: IMPRESSION: 1. Hypermetabolic gastrohepatic ligament, abdominal retroperitoneal and small bowel mesenteric adenopathy, most indicative of lymphoma. 2. Aortic atherosclerosis (ICD10-170.0). Coronary artery Calcification.  6. Patient underwent CT-guided biopsy of the mesenteric lymph node which showed:DIAGNOSIS:  A. LYMPH NODE, MESENTERIC; CT-GUIDED CORE BIOPSY:  - LARGE B-CELL LYMPHOMA, CD10 POSITIVE.   Comment:  There is a diffuse proliferation of large lymphocytes with irregular  nuclear contours, inconspicuous nucleoli, and scant cytoplasm. The flow  cytometry and IHC results are most consistent with diffuse large B-cell  lymphoma, not otherwise specified, germinal center B-cell type. However,  it is likely that [place] for MYC, BCL2 and BCL6 gene rearrangements will  be ordered, so final classification will be based on the [place] result  7. Bone marrow biopsy was negative for lymphoma  8. 17% of nuclei were positive for BCL-2 rearrangement. 35% of nuclei had extra myc signals but no gene rearrangement for cmyc noted. This was consistent with evolved lymphoma or DLBCL. Not consistentwithwith double hit or double expressor  9. Interim scans after 3 cycles of RCHOP showed: IMPRESSION: 2.2 x 5.3 cm jejunal mesenteric nodal mass along the anterior mid abdomen, significantly improved.   10.  PET/CT scan after 6 cycles of therapy showed mesenteric nodule mass was 5.9 x 2.3 cm with an SUV of 2.7.  This was discussed at tumor board.  Likelihood of this representing residual lymphoma is low given the low SUV uptake which has been similar to prior PET scan 3 months ago.  Plan is to proceed with consolidation radiation treatment followed by repeat PET/CT scans 3 months after that  Interval history- appetite and energy levels are improving.  Still feels fatigued.  ECOG PS- 1 Pain scale- 0   Review of systems- Review of  Systems  Constitutional: Negative for chills, fever, malaise/fatigue and weight loss.  HENT: Negative for congestion, ear discharge and nosebleeds.   Eyes: Negative for blurred vision.  Respiratory: Negative for cough, hemoptysis, sputum production, shortness of breath and wheezing.   Cardiovascular: Negative for chest pain, palpitations, orthopnea and claudication.  Gastrointestinal: Negative for abdominal pain, blood in stool, constipation, diarrhea, heartburn, melena, nausea and vomiting.  Genitourinary: Negative for dysuria, flank pain, frequency, hematuria and urgency.  Musculoskeletal: Negative for back pain, joint pain and myalgias.  Skin: Negative for rash.  Neurological: Negative for dizziness, tingling, focal weakness, seizures, weakness and headaches.  Endo/Heme/Allergies: Does not bruise/bleed easily.  Psychiatric/Behavioral: Negative for depression and suicidal ideas. The patient does not have insomnia.       Allergies  Allergen Reactions  . Tape Rash     Past Medical History:  Diagnosis Date  . Arthritis   . Basal cell carcinoma 2008   forehead  . Cancer ([place])   . Chronic renal insufficiency, stage III (moderate) (HCC)   . Colon cancer ([place]) 1998  . Diabetes mellitus without complication ([place])    Controlled with diet only as of 11/01/2016  . History of kidney stones   . Hyperlipemia   . Hypertension   . Lyme disease   . Prostate cancer ([place]) 1999     Past Surgical History:  Procedure Laterality Date  . COLON SURGERY  1998  . COLONOSCOPY    . COLONOSCOPY WITH PROPOFOL N/A 12/29/2014   Performed by Lollie Sails, MD at [place]  . EYE SURGERY    . FRACTURE SURGERY    . IR FLUORO GUIDE PORT INSERTION RIGHT  09/01/2016  . LITHOTRIPSY    . PROSTATECTOMY  1998  . TONSILLECTOMY      Social History   Socioeconomic History  . Marital status: Married    Spouse name: Not on file  . Number of children: Not on file  . Years of education: Not on  file  . Highest education level: Not on file  Social Needs  . Financial resource strain: Not on file  . Food insecurity - worry: Not on file  . Food insecurity - inability: Not on file  . Transportation needs - medical: Not on file  . Transportation needs - non-medical: Not on file  Occupational History  . Not on file  Tobacco Use  . Smoking status: Former Smoker    Packs/day: 1.00    Years: 8.00    Pack years: 8.00    Types: Cigarettes    Last attempt to quit: 08/13/1974    Years since quitting: 42.4  . Smokeless tobacco: Former Systems developer    Types: Snuff  Substance and Sexual Activity  . Alcohol use: Yes    Comment: rarely-one or two beer  . Drug use: No  . Sexual activity: Not Currently  Other Topics Concern  . Not on file  Social History Narrative  . Not on file    Family History  Problem Relation Age of Onset  . Coronary artery disease Mother   . Diabetes Mother   . Prostate cancer Father   . Pancreatitis Father   . Bladder Cancer Sister   . Diabetes Brother   . Diabetes Brother   . Prostate cancer Brother   . Diabetes Brother   . Diabetes Maternal Grandmother      Current Outpatient Medications:  .  ACCU-CHEK FASTCLIX LANCETS MISC, , Disp: , Rfl:  .  aspirin EC 81 MG tablet, Take 81 mg by mouth every other day. , Disp: , Rfl:  .  atorvastatin (LIPITOR) 40 MG tablet, Take 40 mg by mouth daily., Disp: , Rfl:  .  Blood Glucose Monitoring Suppl (TRUE METRIX AIR GLUCOSE METER) w/Device KIT, , Disp: , Rfl:  .  hydrochlorothiazide (HYDRODIURIL) 25 MG tablet, Take 25 mg by mouth daily., Disp: , Rfl:  .  lidocaine-prilocaine (EMLA) cream, Apply to affected area once, Disp: 30 g, Rfl: 3 .  LORazepam (ATIVAN) 0.5 MG tablet, Take 1 tablet (0.5 mg total) by mouth every 6 (six) hours as needed (Nausea or vomiting)., Disp: 30 tablet, Rfl: 0 .  Multiple Vitamin (MULTIVITAMIN) capsule, Take 1 capsule by mouth daily., Disp: , Rfl:  .  omeprazole (PRILOSEC) 20 MG capsule, TAKE 1  CAPSULE BY MOUTH ONCE DAILY, Disp: 30 capsule, Rfl: 1 .  ondansetron (ZOFRAN) 8 MG tablet, Take 1 tablet (8 mg total) by mouth 2 (two) times daily as needed for refractory nausea / vomiting. Start on day 3 after cyclophosphamide chemotherapy., Disp: 30 tablet, Rfl: 1 .  predniSONE (DELTASONE) 50 MG tablet, Take 2 tablets (100 mg total) by mouth daily. Take on days 1-5 of each chemotherapy given every 3 weeks, Disp: 30 tablet, Rfl: 0 .  prochlorperazine (COMPAZINE) 10 MG tablet, Take 1 tablet (10 mg total) by mouth every 6 (six) hours as needed (Nausea or vomiting)., Disp: 30 tablet, Rfl: 6  Physical exam:  Vitals:   01/10/17 1014  BP: 138/81  Pulse: 80  Temp: (!) 97 F (36.1 C)  TempSrc: Tympanic  Weight: 184 lb 9.6 oz (83.7 kg)   Physical Exam  Constitutional: He is oriented to person, place, and time and well-developed, well-nourished, and in no distress.  HENT:  Head: Normocephalic and atraumatic.  Eyes: EOM are normal. Pupils are equal, round, and reactive to light.  Neck: Normal range of motion.  Cardiovascular: Normal rate, regular rhythm and normal heart sounds.  Pulmonary/Chest: Effort normal and breath sounds normal.  Abdominal: Soft. Bowel sounds are normal.  Neurological: He is alert and oriented to person, place, and time.  Skin: Skin is warm and dry.     CMP Latest Ref Rng & Units 12/20/2016  Glucose 65 - 99 mg/dL 213(H)  BUN 6 - 20 mg/dL 17  Creatinine 0.61 - 1.24 mg/dL 1.00  Sodium 135 - 145 mmol/L 139  Potassium 3.5 - 5.1 mmol/L 3.2(L)  Chloride 101 - 111 mmol/L 103  CO2 22 - 32 mmol/L 26  Calcium 8.9 - 10.3 mg/dL 8.7(L)  Total Protein 6.5 - 8.1 g/dL 6.1(L)  Total Bilirubin 0.3 - 1.2 mg/dL 0.7  Alkaline Phos 38 - 126 U/L 98  AST 15 - 41 U/L 32  ALT 17 - 63 U/L 26   CBC Latest Ref Rng & Units 12/20/2016  WBC 3.8 - 10.6 K/uL 6.1  Hemoglobin 13.0 - 18.0 g/dL 10.3(L)  Hematocrit 40.0 - 52.0 % 30.4(L)  Platelets 150 - 440 K/uL 232    No images are attached  to the encounter.  Nm Pet Image Restag (ps) Skull Base To Thigh  Result Date: 01/04/2017 CLINICAL DATA:  Subsequent treatment strategy for diffuse large B-cell lymphoma. Last chemotherapy 2 weeks ago. Diabetes. EXAM: NUCLEAR MEDICINE PET SKULL BASE TO THIGH TECHNIQUE: 12.5 mCi F-18 FDG was injected intravenously. Full-ring PET imaging was performed from the skull base to thigh after the radiotracer. CT data  was obtained and used for attenuation correction and anatomic localization. FASTING BLOOD GLUCOSE:  Value: 188 mg/dl COMPARISON:  11/01/2016 FINDINGS: NECK: No areas of abnormal hypermetabolism.  No cervical adenopathy. CHEST: No pulmonary parenchymal or thoracic nodal hypermetabolism. Mild cardiomegaly. Right Port-A-Cath which terminates in the low SVC. Multivessel coronary artery atherosclerosis. No thoracic adenopathy. Vague nodules along the right minor fissure including a possible 8 mm ground-glass nodule on image 114/series 3. Felt to be similar to on the prior. 2 mm subpleural left lower lobe pulmonary nodule is unchanged, including on image 121/series 3. ABDOMEN/PELVIS: Small bowel mesenteric nodal conglomerate again identified. Measures on the order of 5.9 x 2.3 cm and a S.U.V. max of 2.7 on image 200/ series 3. Compare 5.3 x 2.2 cm and a S.U.V. max of 2.6 on the prior. No other abdominopelvic abnormal hypermetabolism identified. Normal adrenal glands. Abdominal aortic atherosclerosis. Interpolar left renal cyst. Left renal sinus cysts. Sigmoid surgical sutures. Pelvic sidewall dissection. Prostatectomy. SKELETON: No abnormal marrow activity. IMPRESSION: 1. Similar isolated mildly hypermetabolic small bowel mesenteric nodal conglomerate. This is similar in size to minimally enlarged compared to the prior exam. Therefore, this could represent treated lymphoma or residual disease. 2. Bilateral pulmonary nodules are nonspecific and not significantly changed. 3. Coronary artery atherosclerosis. Aortic  Atherosclerosis (ICD10-I70.0). Electronically Signed   By: Abigail Miyamoto M.D.   On: 01/04/2017 12:49     Assessment and plan- Patient is a 76 y.o. male with stage II diffuse large B-cell lymphoma GCB status post 6 cycles of R CHOP chemotherapy  I have personally reviewed patient's PET/CT scan I have discussed the findings with the patient in detail.  Patient's case was also discussed at the tumor board.  After 6 cycles of treatment there has been interval decrease in the size of the mass and significant decrease in the SUV uptake which is currently measuring at SUV of 2.7.  I explained to the patient that in many cases the lymphoma mass can persist but that does not always indicate metabolically active disease.  As per discussions of the tumor board plan is to proceed with consolidative radiation treatment to the mesenteric nodal mass and I will refer him to Dr. Donella Stade for the same.  I will get a repeat PET/CT scan 3 months upon completion of radiation treatment  2.  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 Donella Stade will also consider him for radiation therapy to his prostate at this time.  PSA is currently pending from today  I will see him back in 1 month's time with a CBC and CMP. He will get port flush at that time   Visit Diagnosis 1. Prostate cancer ([place])   2. Diffuse large B-cell lymphoma of intra-abdominal lymph nodes (HCC)      Dr. Randa Evens, MD, MPH [facility] at [facility] Pager- [phone] 01/10/2017 10:37 AM

## 2017-01-10 ENCOUNTER — Inpatient Hospital Stay: Payer: Medicare HMO | Attending: Oncology

## 2017-01-10 ENCOUNTER — Inpatient Hospital Stay (HOSPITAL_BASED_OUTPATIENT_CLINIC_OR_DEPARTMENT_OTHER): Payer: Medicare HMO | Admitting: Oncology

## 2017-01-10 ENCOUNTER — Encounter: Payer: Self-pay | Admitting: Oncology

## 2017-01-10 ENCOUNTER — Other Ambulatory Visit: Payer: Self-pay | Admitting: *Deleted

## 2017-01-10 ENCOUNTER — Other Ambulatory Visit: Payer: Self-pay

## 2017-01-10 VITALS — BP 138/81 | HR 80 | Temp 97.0°F | Wt 184.6 lb

## 2017-01-10 DIAGNOSIS — I700 Atherosclerosis of aorta: Secondary | ICD-10-CM | POA: Insufficient documentation

## 2017-01-10 DIAGNOSIS — Z87891 Personal history of nicotine dependence: Secondary | ICD-10-CM

## 2017-01-10 DIAGNOSIS — Z85038 Personal history of other malignant neoplasm of large intestine: Secondary | ICD-10-CM | POA: Insufficient documentation

## 2017-01-10 DIAGNOSIS — Z923 Personal history of irradiation: Secondary | ICD-10-CM | POA: Diagnosis not present

## 2017-01-10 DIAGNOSIS — C8333 Diffuse large B-cell lymphoma, intra-abdominal lymph nodes: Secondary | ICD-10-CM

## 2017-01-10 DIAGNOSIS — I2510 Atherosclerotic heart disease of native coronary artery without angina pectoris: Secondary | ICD-10-CM | POA: Diagnosis not present

## 2017-01-10 DIAGNOSIS — Z8052 Family history of malignant neoplasm of bladder: Secondary | ICD-10-CM | POA: Diagnosis not present

## 2017-01-10 DIAGNOSIS — E785 Hyperlipidemia, unspecified: Secondary | ICD-10-CM

## 2017-01-10 DIAGNOSIS — E1122 Type 2 diabetes mellitus with diabetic chronic kidney disease: Secondary | ICD-10-CM

## 2017-01-10 DIAGNOSIS — Z7982 Long term (current) use of aspirin: Secondary | ICD-10-CM | POA: Insufficient documentation

## 2017-01-10 DIAGNOSIS — Z79899 Other long term (current) drug therapy: Secondary | ICD-10-CM | POA: Insufficient documentation

## 2017-01-10 DIAGNOSIS — Z8042 Family history of malignant neoplasm of prostate: Secondary | ICD-10-CM | POA: Diagnosis not present

## 2017-01-10 DIAGNOSIS — Z85828 Personal history of other malignant neoplasm of skin: Secondary | ICD-10-CM

## 2017-01-10 DIAGNOSIS — Z9221 Personal history of antineoplastic chemotherapy: Secondary | ICD-10-CM | POA: Insufficient documentation

## 2017-01-10 DIAGNOSIS — C61 Malignant neoplasm of prostate: Secondary | ICD-10-CM | POA: Diagnosis not present

## 2017-01-10 DIAGNOSIS — M129 Arthropathy, unspecified: Secondary | ICD-10-CM

## 2017-01-10 DIAGNOSIS — C8338 Diffuse large B-cell lymphoma, lymph nodes of multiple sites: Secondary | ICD-10-CM | POA: Insufficient documentation

## 2017-01-10 LAB — COMPREHENSIVE METABOLIC PANEL
ALBUMIN: 3.7 g/dL (ref 3.5–5.0)
ALT: 35 U/L (ref 17–63)
AST: 33 U/L (ref 15–41)
Alkaline Phosphatase: 91 U/L (ref 38–126)
Anion gap: 10 (ref 5–15)
BILIRUBIN TOTAL: 0.7 mg/dL (ref 0.3–1.2)
BUN: 20 mg/dL (ref 6–20)
CHLORIDE: 101 mmol/L (ref 101–111)
CO2: 29 mmol/L (ref 22–32)
Calcium: 9.2 mg/dL (ref 8.9–10.3)
Creatinine, Ser: 1.01 mg/dL (ref 0.61–1.24)
GFR calc Af Amer: >60 mL/min (ref >60)
GFR calc non Af Amer: >60 mL/min (ref >60)
GLUCOSE: 196 mg/dL — AB (ref 65–99)
POTASSIUM: 4.1 mmol/L (ref 3.5–5.1)
SODIUM: 140 mmol/L (ref 135–145)
Total Protein: 6.5 g/dL (ref 6.5–8.1)

## 2017-01-10 LAB — CBC WITH DIFFERENTIAL/PLATELET
BASOS ABS: 0 10*3/uL (ref 0–0.1)
BASOS PCT: 0 %
EOS ABS: 0 10*3/uL (ref 0–0.7)
Eosinophils Relative: 1 %
HEMATOCRIT: 31.6 % — AB (ref 40.0–52.0)
HEMOGLOBIN: 10.6 g/dL — AB (ref 13.0–18.0)
LYMPHS PCT: 12 %
Lymphs Abs: 0.6 10*3/uL — ABNORMAL LOW (ref 1.0–3.6)
MCH: 31.9 pg (ref 26.0–34.0)
MCHC: 33.5 g/dL (ref 32.0–36.0)
MCV: 95.1 fL (ref 80.0–100.0)
MONO ABS: 1.1 10*3/uL — AB (ref 0.2–1.0)
MONOS PCT: 20 %
NEUTROS ABS: 3.4 10*3/uL (ref 1.4–6.5)
Neutrophils Relative %: 67 %
Platelets: 220 10*3/uL (ref 150–440)
RBC: 3.32 MIL/uL — ABNORMAL LOW (ref 4.40–5.90)
RDW: 15.4 % — AB (ref 11.5–14.5)
WBC: 5.2 10*3/uL (ref 3.8–10.6)

## 2017-01-10 LAB — HEMOGLOBIN A1C
Hgb A1c MFr Bld: 7.8 % — ABNORMAL HIGH (ref 4.8–5.6)
MEAN PLASMA GLUCOSE: 177.16 mg/dL

## 2017-01-10 LAB — LIPID PANEL
CHOLESTEROL: 191 mg/dL (ref 0–200)
HDL: 35 mg/dL — ABNORMAL LOW (ref >40)
LDL Cholesterol: 79 mg/dL (ref 0–99)
TRIGLYCERIDES: 385 mg/dL — AB (ref <150)
Total CHOL/HDL Ratio: 5.5 RATIO
VLDL: 77 mg/dL — AB (ref 0–40)

## 2017-01-10 LAB — PSA: PROSTATIC SPECIFIC ANTIGEN: 0.96 ng/mL (ref 0.00–4.00)

## 2017-01-17 ENCOUNTER — Other Ambulatory Visit: Payer: Self-pay | Admitting: *Deleted

## 2017-01-17 ENCOUNTER — Encounter: Payer: Self-pay | Admitting: Radiation Oncology

## 2017-01-17 ENCOUNTER — Ambulatory Visit
Admission: RE | Admit: 2017-01-17 | Discharge: 2017-01-17 | Disposition: A | Discharge status: Home / Self Care | Payer: Medicare HMO | Source: Ambulatory Visit | Attending: Radiation Oncology | Admitting: Radiation Oncology

## 2017-01-17 ENCOUNTER — Other Ambulatory Visit: Payer: Self-pay

## 2017-01-17 VITALS — BP 126/79 | HR 95 | Temp 96.7°F | Resp 18 | Wt 187.3 lb

## 2017-01-17 DIAGNOSIS — N183 Chronic kidney disease, stage 3 (moderate): Secondary | ICD-10-CM | POA: Insufficient documentation

## 2017-01-17 DIAGNOSIS — M129 Arthropathy, unspecified: Secondary | ICD-10-CM | POA: Diagnosis not present

## 2017-01-17 DIAGNOSIS — C8333 Diffuse large B-cell lymphoma, intra-abdominal lymph nodes: Secondary | ICD-10-CM

## 2017-01-17 DIAGNOSIS — Z85828 Personal history of other malignant neoplasm of skin: Secondary | ICD-10-CM | POA: Diagnosis not present

## 2017-01-17 DIAGNOSIS — I129 Hypertensive chronic kidney disease with stage 1 through stage 4 chronic kidney disease, or unspecified chronic kidney disease: Secondary | ICD-10-CM | POA: Insufficient documentation

## 2017-01-17 DIAGNOSIS — Z79899 Other long term (current) drug therapy: Secondary | ICD-10-CM | POA: Diagnosis not present

## 2017-01-17 DIAGNOSIS — C61 Malignant neoplasm of prostate: Secondary | ICD-10-CM | POA: Diagnosis not present

## 2017-01-17 DIAGNOSIS — R5383 Other fatigue: Secondary | ICD-10-CM | POA: Diagnosis not present

## 2017-01-17 DIAGNOSIS — Z8051 Family history of malignant neoplasm of kidney: Secondary | ICD-10-CM | POA: Diagnosis not present

## 2017-01-17 DIAGNOSIS — E119 Type 2 diabetes mellitus without complications: Secondary | ICD-10-CM | POA: Diagnosis not present

## 2017-01-17 DIAGNOSIS — Z7982 Long term (current) use of aspirin: Secondary | ICD-10-CM | POA: Diagnosis not present

## 2017-01-17 DIAGNOSIS — Z8042 Family history of malignant neoplasm of prostate: Secondary | ICD-10-CM | POA: Insufficient documentation

## 2017-01-17 DIAGNOSIS — E785 Hyperlipidemia, unspecified: Secondary | ICD-10-CM | POA: Insufficient documentation

## 2017-01-17 DIAGNOSIS — Z85038 Personal history of other malignant neoplasm of large intestine: Secondary | ICD-10-CM | POA: Diagnosis not present

## 2017-01-17 DIAGNOSIS — Z510 Encounter for antineoplastic radiation therapy: Secondary | ICD-10-CM | POA: Insufficient documentation

## 2017-01-17 DIAGNOSIS — C8338 Diffuse large B-cell lymphoma, lymph nodes of multiple sites: Secondary | ICD-10-CM | POA: Diagnosis not present

## 2017-01-17 DIAGNOSIS — N393 Stress incontinence (female) (male): Secondary | ICD-10-CM | POA: Insufficient documentation

## 2017-01-17 DIAGNOSIS — Z87442 Personal history of urinary calculi: Secondary | ICD-10-CM | POA: Insufficient documentation

## 2017-01-17 DIAGNOSIS — Z9221 Personal history of antineoplastic chemotherapy: Secondary | ICD-10-CM | POA: Diagnosis not present

## 2017-01-17 DIAGNOSIS — Z87891 Personal history of nicotine dependence: Secondary | ICD-10-CM | POA: Insufficient documentation

## 2017-01-17 NOTE — Consult Note (Signed)
NEW PATIENT EVALUATION  Name: Christian Kelly  MRN: [id number]  Date:   01/17/2017     DOB: 09-04-1940   This 76 y.o. male patient presents to the clinic for initial evaluation of involved field radiation for diffuse large B-cell lymphoma status post R CHOP. Patient also has biochemical relapse of prostate cancer status post prostatectomy.  REFERRING PHYSICIAN: Ezequiel Kayser, MD  CHIEF COMPLAINT:  Chief Complaint  Patient presents with  . Cancer    Pt is here for consultation of lymphoma and prostate.       DIAGNOSIS: The primary encounter diagnosis was Malignant neoplasm of prostate ([place]). A diagnosis of Diffuse large B-cell lymphoma of intra-abdominal lymph nodes (HCC) was also pertinent to this visit.   PREVIOUS INVESTIGATIONS:  Pathology reports reviewed PET CT scans reviewed Clinical notes reviewed Case presented at weekly tumor conference  HPI: Patient is a 76 year old male originally diagnosed in 1998 with prostate cancer status post radical prostatectomy. Surgery was performed outside of [facility]. He thinks his PSA was 7 at the time of diagnosis although we do not have his official pathology report for review. He was noted to having rising PSA up to 0.63 in April 2018 and most recently 0.96. He was seen by urology and CT scan was ordered showing extensive abdominal adenopathy. This was followed by PET CT scan which showed hypermetabolic gastrohepatic ligament and abdominal retroperitoneal and small bowel mesenteric adenopathy indicative of lymphoma. Patient underwent CT-guided biopsy which was positive for large B-cell lymphoma CD 10 positive. Tumor was by fish-negative. Bone marrow was negative for lymphoma. Patient has completed 6 cycles of R CHOP with repeat PET CT scan showing 6 cm area of residual mesenteric adenopathy with an SUV of 2.7. Case was presented at weekly tumor conference and recommendation for involved field radiation to this area was made. Patient is seen today for  consideration of involved field radiation. He is doing well recovering from his chemotherapy still quite fatigued. He also has stress incontinence no other GI or GU complaints.  PLANNED TREATMENT REGIMEN: Involved field radiation therapy plus salvage radiation therapy for biochemical recurrence of prostate cancer  PAST MEDICAL HISTORY:  has a past medical history of Arthritis, Basal cell carcinoma (2008), Cancer [facility]), Chronic renal insufficiency, stage III (moderate) ([place]), Colon cancer ([place]) (1998), Diabetes mellitus without complication ([place]), History of kidney stones, Hyperlipemia, Hypertension, Lyme disease, and Prostate cancer ([place]) (1999).    PAST SURGICAL HISTORY:  Past Surgical History:  Procedure Laterality Date  . COLON SURGERY  1998  . COLONOSCOPY    . COLONOSCOPY WITH PROPOFOL N/A 12/29/2014   Procedure: COLONOSCOPY WITH PROPOFOL;  Surgeon: Lollie Sails, MD;  [facility]: [facility] ENDOSCOPY;  Service: Endoscopy;  Laterality: N/A;  . EYE SURGERY    . FRACTURE SURGERY    . IR FLUORO GUIDE PORT INSERTION RIGHT  09/01/2016  . LITHOTRIPSY    . PROSTATECTOMY  1998  . TONSILLECTOMY      FAMILY HISTORY: family history includes Bladder Cancer in his sister; Coronary artery disease in his mother; Diabetes in his brother, brother, brother, maternal grandmother, and mother; Pancreatitis in his father; Prostate cancer in his brother and father.  SOCIAL HISTORY:  reports that he quit smoking about 42 years ago. His smoking use included cigarettes. He has a 8.00 pack-year smoking history. He has quit using smokeless tobacco. His smokeless tobacco use included snuff. He reports that he drinks alcohol. He reports that he does not use drugs.  ALLERGIES: Tape  MEDICATIONS:  Current Outpatient Medications  Medication Sig Dispense Refill  . ACCU-CHEK FASTCLIX LANCETS MISC     . aspirin EC 81 MG tablet Take 81 mg by mouth every other day.     Marland Kitchen atorvastatin (LIPITOR) 40 MG tablet Take 40 mg by  mouth daily.    . Blood Glucose Monitoring Suppl (TRUE METRIX AIR GLUCOSE METER) w/Device KIT     . hydrochlorothiazide (HYDRODIURIL) 25 MG tablet Take 25 mg by mouth daily.    Marland Kitchen lidocaine-prilocaine (EMLA) cream Apply to affected area once 30 g 3  . Multiple Vitamin (MULTIVITAMIN) capsule Take 1 capsule by mouth daily.    Marland Kitchen LORazepam (ATIVAN) 0.5 MG tablet Take 1 tablet (0.5 mg total) by mouth every 6 (six) hours as needed (Nausea or vomiting). (Patient not taking: Reported on 01/17/2017) 30 tablet 0  . omeprazole (PRILOSEC) 20 MG capsule TAKE 1 CAPSULE BY MOUTH ONCE DAILY (Patient not taking: Reported on 01/17/2017) 30 capsule 1  . ondansetron (ZOFRAN) 8 MG tablet Take 1 tablet (8 mg total) by mouth 2 (two) times daily as needed for refractory nausea / vomiting. Start on day 3 after cyclophosphamide chemotherapy. (Patient not taking: Reported on 01/17/2017) 30 tablet 1  . predniSONE (DELTASONE) 50 MG tablet Take 2 tablets (100 mg total) by mouth daily. Take on days 1-5 of each chemotherapy given every 3 weeks (Patient not taking: Reported on 01/17/2017) 30 tablet 0  . prochlorperazine (COMPAZINE) 10 MG tablet Take 1 tablet (10 mg total) by mouth every 6 (six) hours as needed (Nausea or vomiting). (Patient not taking: Reported on 01/17/2017) 30 tablet 6   No current facility-administered medications for this encounter.     ECOG PERFORMANCE STATUS:  0 - Asymptomatic  REVIEW OF SYSTEMS:  Patient denies any weight loss, fatigue, weakness, fever, chills or night sweats. Patient denies any loss of vision, blurred vision. Patient denies any ringing  of the ears or hearing loss. No irregular heartbeat. Patient denies heart murmur or history of fainting. Patient denies any chest pain or pain radiating to her upper extremities. Patient denies any shortness of breath, difficulty breathing at night, cough or hemoptysis. Patient denies any swelling in the lower legs. Patient denies any nausea vomiting,  vomiting of blood, or coffee ground material in the vomitus. Patient denies any stomach pain. Patient states has had normal bowel movements no significant constipation or diarrhea. Patient denies any dysuria, hematuria or significant nocturia. Patient denies any problems walking, swelling in the joints or loss of balance. Patient denies any skin changes, loss of hair or loss of weight. Patient denies any excessive worrying or anxiety or significant depression. Patient denies any problems with insomnia. Patient denies excessive thirst, polyuria, polydipsia. Patient denies any swollen glands, patient denies easy bruising or easy bleeding. Patient denies any recent infections, allergies or URI. Patient "s visual fields have not changed significantly in recent time.    PHYSICAL EXAM: BP 126/79   Pulse 95   Temp (!) 96.7 F (35.9 C)   Resp 18   Wt 187 lb 4.5 oz (84.9 kg)   BMI 25.40 kg/m  No peripheral adenopathy is identified. On rectal exam rectal sphincter tone is good prostatic fossa is clear without evidence of nodularity or mass. Well-developed well-nourished patient in NAD. HEENT reveals PERLA, EOMI, discs not visualized.  Oral cavity is clear. No oral mucosal lesions are identified. Neck is clear without evidence of cervical or supraclavicular adenopathy. Lungs are clear to A&P. Cardiac examination is essentially unremarkable with  regular rate and rhythm without murmur rub or thrill. Abdomen is benign with no organomegaly or masses noted. Motor sensory and DTR levels are equal and symmetric in the upper and lower extremities. Cranial nerves II through XII are grossly intact. Proprioception is intact. No peripheral adenopathy or edema is identified. No motor or sensory levels are noted. Crude visual fields are within normal range.  LABORATORY DATA: Pathology reports reviewed    RADIOLOGY RESULTS: Serial PET CT scans are reviewed   IMPRESSION: Diffuse large B-cell lymphoma status post 6 cycles  of R CHOP with residual disease by PET CT criteria in retroperitoneal lymph nodes as well as biochemical recurrence of his prostate cancer status post prostatectomy.  PLAN: At this time like to go ahead with involved field radiation therapy. Will target the areas of aggregate adenopathy in his retroperitoneum which is PET positive. Would deliver 3060 cGy in 17 fractions using PET/CT fusion study for targeting. Risks and benefits of treatment including possible diarrhea fatigue nausea alteration of blood counts skin reaction all were described in detail to the patient and his wife. I will give him up approximately 1 month break after completion of involved field radiation and start discussions about salvage radiation therapy for his prostate cancer. I would in that scenario target his prostatic fossa as well as first echelon lymph node involvement.There will be extra effort by both professional staff as well as technical staff to coordinate and manage concurrent chemoradiation and ensuing side effects during his treatments. Patient is wife both seem to comprehend my treatment plan well. I personally ordered and scheduled CT simulation for early next week.  I would like to take this opportunity to thank you for allowing me to participate in the care of your patient.Armstead Peaks., MD

## 2017-01-19 ENCOUNTER — Ambulatory Visit: Payer: Medicare HMO | Admitting: Oncology

## 2017-01-19 ENCOUNTER — Other Ambulatory Visit: Payer: Medicare HMO

## 2017-01-24 ENCOUNTER — Ambulatory Visit
Admission: RE | Admit: 2017-01-24 | Discharge: 2017-01-24 | Disposition: A | Discharge status: Home / Self Care | Payer: Medicare HMO | Source: Ambulatory Visit | Attending: Radiation Oncology | Admitting: Radiation Oncology

## 2017-01-24 DIAGNOSIS — Z510 Encounter for antineoplastic radiation therapy: Secondary | ICD-10-CM | POA: Diagnosis not present

## 2017-01-24 DIAGNOSIS — C61 Malignant neoplasm of prostate: Secondary | ICD-10-CM | POA: Diagnosis not present

## 2017-01-24 DIAGNOSIS — E119 Type 2 diabetes mellitus without complications: Secondary | ICD-10-CM | POA: Diagnosis not present

## 2017-01-24 DIAGNOSIS — C8338 Diffuse large B-cell lymphoma, lymph nodes of multiple sites: Secondary | ICD-10-CM | POA: Diagnosis not present

## 2017-01-24 DIAGNOSIS — R5383 Other fatigue: Secondary | ICD-10-CM | POA: Diagnosis not present

## 2017-01-24 DIAGNOSIS — I129 Hypertensive chronic kidney disease with stage 1 through stage 4 chronic kidney disease, or unspecified chronic kidney disease: Secondary | ICD-10-CM | POA: Diagnosis not present

## 2017-01-24 DIAGNOSIS — N393 Stress incontinence (female) (male): Secondary | ICD-10-CM | POA: Diagnosis not present

## 2017-01-24 DIAGNOSIS — M129 Arthropathy, unspecified: Secondary | ICD-10-CM | POA: Diagnosis not present

## 2017-01-24 DIAGNOSIS — N183 Chronic kidney disease, stage 3 (moderate): Secondary | ICD-10-CM | POA: Diagnosis not present

## 2017-01-26 DIAGNOSIS — C61 Malignant neoplasm of prostate: Secondary | ICD-10-CM | POA: Diagnosis not present

## 2017-01-26 DIAGNOSIS — N183 Chronic kidney disease, stage 3 (moderate): Secondary | ICD-10-CM | POA: Diagnosis not present

## 2017-01-26 DIAGNOSIS — E119 Type 2 diabetes mellitus without complications: Secondary | ICD-10-CM | POA: Diagnosis not present

## 2017-01-26 DIAGNOSIS — C8338 Diffuse large B-cell lymphoma, lymph nodes of multiple sites: Secondary | ICD-10-CM | POA: Diagnosis not present

## 2017-01-26 DIAGNOSIS — N393 Stress incontinence (female) (male): Secondary | ICD-10-CM | POA: Diagnosis not present

## 2017-01-26 DIAGNOSIS — M129 Arthropathy, unspecified: Secondary | ICD-10-CM | POA: Diagnosis not present

## 2017-01-26 DIAGNOSIS — I129 Hypertensive chronic kidney disease with stage 1 through stage 4 chronic kidney disease, or unspecified chronic kidney disease: Secondary | ICD-10-CM | POA: Diagnosis not present

## 2017-01-26 DIAGNOSIS — Z510 Encounter for antineoplastic radiation therapy: Secondary | ICD-10-CM | POA: Diagnosis not present

## 2017-01-26 DIAGNOSIS — R5383 Other fatigue: Secondary | ICD-10-CM | POA: Diagnosis not present

## 2017-01-27 ENCOUNTER — Other Ambulatory Visit: Payer: Self-pay | Admitting: *Deleted

## 2017-01-27 DIAGNOSIS — C8338 Diffuse large B-cell lymphoma, lymph nodes of multiple sites: Secondary | ICD-10-CM

## 2017-01-31 ENCOUNTER — Ambulatory Visit
Admission: RE | Admit: 2017-01-31 | Discharge: 2017-01-31 | Disposition: A | Discharge status: Home / Self Care | Payer: Medicare HMO | Source: Ambulatory Visit | Attending: Radiation Oncology | Admitting: Radiation Oncology

## 2017-01-31 DIAGNOSIS — C61 Malignant neoplasm of prostate: Secondary | ICD-10-CM | POA: Diagnosis not present

## 2017-01-31 DIAGNOSIS — I129 Hypertensive chronic kidney disease with stage 1 through stage 4 chronic kidney disease, or unspecified chronic kidney disease: Secondary | ICD-10-CM | POA: Diagnosis not present

## 2017-01-31 DIAGNOSIS — N183 Chronic kidney disease, stage 3 (moderate): Secondary | ICD-10-CM | POA: Diagnosis not present

## 2017-01-31 DIAGNOSIS — R5383 Other fatigue: Secondary | ICD-10-CM | POA: Diagnosis not present

## 2017-01-31 DIAGNOSIS — C8338 Diffuse large B-cell lymphoma, lymph nodes of multiple sites: Secondary | ICD-10-CM | POA: Diagnosis not present

## 2017-01-31 DIAGNOSIS — N393 Stress incontinence (female) (male): Secondary | ICD-10-CM | POA: Diagnosis not present

## 2017-01-31 DIAGNOSIS — Z510 Encounter for antineoplastic radiation therapy: Secondary | ICD-10-CM | POA: Diagnosis not present

## 2017-01-31 DIAGNOSIS — M129 Arthropathy, unspecified: Secondary | ICD-10-CM | POA: Diagnosis not present

## 2017-01-31 DIAGNOSIS — E119 Type 2 diabetes mellitus without complications: Secondary | ICD-10-CM | POA: Diagnosis not present

## 2017-02-01 ENCOUNTER — Ambulatory Visit
Admission: RE | Admit: 2017-02-01 | Discharge: 2017-02-01 | Disposition: A | Discharge status: Home / Self Care | Payer: Medicare HMO | Source: Ambulatory Visit | Attending: Radiation Oncology | Admitting: Radiation Oncology

## 2017-02-01 DIAGNOSIS — M129 Arthropathy, unspecified: Secondary | ICD-10-CM | POA: Diagnosis not present

## 2017-02-01 DIAGNOSIS — I129 Hypertensive chronic kidney disease with stage 1 through stage 4 chronic kidney disease, or unspecified chronic kidney disease: Secondary | ICD-10-CM | POA: Diagnosis not present

## 2017-02-01 DIAGNOSIS — R5383 Other fatigue: Secondary | ICD-10-CM | POA: Diagnosis not present

## 2017-02-01 DIAGNOSIS — N393 Stress incontinence (female) (male): Secondary | ICD-10-CM | POA: Diagnosis not present

## 2017-02-01 DIAGNOSIS — C8338 Diffuse large B-cell lymphoma, lymph nodes of multiple sites: Secondary | ICD-10-CM | POA: Diagnosis not present

## 2017-02-01 DIAGNOSIS — C61 Malignant neoplasm of prostate: Secondary | ICD-10-CM | POA: Diagnosis not present

## 2017-02-01 DIAGNOSIS — E119 Type 2 diabetes mellitus without complications: Secondary | ICD-10-CM | POA: Diagnosis not present

## 2017-02-01 DIAGNOSIS — N183 Chronic kidney disease, stage 3 (moderate): Secondary | ICD-10-CM | POA: Diagnosis not present

## 2017-02-01 DIAGNOSIS — Z510 Encounter for antineoplastic radiation therapy: Secondary | ICD-10-CM | POA: Diagnosis not present

## 2017-02-02 ENCOUNTER — Ambulatory Visit: Payer: Medicare HMO

## 2017-02-02 ENCOUNTER — Ambulatory Visit
Admission: RE | Admit: 2017-02-02 | Discharge: 2017-02-02 | Disposition: A | Discharge status: Home / Self Care | Payer: Medicare HMO | Source: Ambulatory Visit | Attending: Radiation Oncology | Admitting: Radiation Oncology

## 2017-02-02 DIAGNOSIS — E119 Type 2 diabetes mellitus without complications: Secondary | ICD-10-CM | POA: Diagnosis not present

## 2017-02-02 DIAGNOSIS — M129 Arthropathy, unspecified: Secondary | ICD-10-CM | POA: Diagnosis not present

## 2017-02-02 DIAGNOSIS — I129 Hypertensive chronic kidney disease with stage 1 through stage 4 chronic kidney disease, or unspecified chronic kidney disease: Secondary | ICD-10-CM | POA: Diagnosis not present

## 2017-02-02 DIAGNOSIS — N393 Stress incontinence (female) (male): Secondary | ICD-10-CM | POA: Diagnosis not present

## 2017-02-02 DIAGNOSIS — R5383 Other fatigue: Secondary | ICD-10-CM | POA: Diagnosis not present

## 2017-02-02 DIAGNOSIS — C8338 Diffuse large B-cell lymphoma, lymph nodes of multiple sites: Secondary | ICD-10-CM | POA: Diagnosis not present

## 2017-02-02 DIAGNOSIS — Z510 Encounter for antineoplastic radiation therapy: Secondary | ICD-10-CM | POA: Diagnosis not present

## 2017-02-02 DIAGNOSIS — C61 Malignant neoplasm of prostate: Secondary | ICD-10-CM | POA: Diagnosis not present

## 2017-02-02 DIAGNOSIS — N183 Chronic kidney disease, stage 3 (moderate): Secondary | ICD-10-CM | POA: Diagnosis not present

## 2017-02-03 ENCOUNTER — Ambulatory Visit
Admission: RE | Admit: 2017-02-03 | Discharge: 2017-02-03 | Disposition: A | Discharge status: Home / Self Care | Payer: Medicare HMO | Source: Ambulatory Visit | Attending: Radiation Oncology | Admitting: Radiation Oncology

## 2017-02-03 ENCOUNTER — Ambulatory Visit: Payer: Medicare HMO

## 2017-02-03 DIAGNOSIS — I129 Hypertensive chronic kidney disease with stage 1 through stage 4 chronic kidney disease, or unspecified chronic kidney disease: Secondary | ICD-10-CM | POA: Diagnosis not present

## 2017-02-03 DIAGNOSIS — Z510 Encounter for antineoplastic radiation therapy: Secondary | ICD-10-CM | POA: Diagnosis not present

## 2017-02-03 DIAGNOSIS — E119 Type 2 diabetes mellitus without complications: Secondary | ICD-10-CM | POA: Diagnosis not present

## 2017-02-03 DIAGNOSIS — R5383 Other fatigue: Secondary | ICD-10-CM | POA: Diagnosis not present

## 2017-02-03 DIAGNOSIS — N183 Chronic kidney disease, stage 3 (moderate): Secondary | ICD-10-CM | POA: Diagnosis not present

## 2017-02-03 DIAGNOSIS — M129 Arthropathy, unspecified: Secondary | ICD-10-CM | POA: Diagnosis not present

## 2017-02-03 DIAGNOSIS — C61 Malignant neoplasm of prostate: Secondary | ICD-10-CM | POA: Diagnosis not present

## 2017-02-03 DIAGNOSIS — C8338 Diffuse large B-cell lymphoma, lymph nodes of multiple sites: Secondary | ICD-10-CM | POA: Diagnosis not present

## 2017-02-03 DIAGNOSIS — N393 Stress incontinence (female) (male): Secondary | ICD-10-CM | POA: Diagnosis not present

## 2017-02-06 ENCOUNTER — Ambulatory Visit: Payer: Medicare HMO

## 2017-02-06 ENCOUNTER — Ambulatory Visit
Admission: RE | Admit: 2017-02-06 | Discharge: 2017-02-06 | Disposition: A | Discharge status: Home / Self Care | Payer: Medicare HMO | Source: Ambulatory Visit | Attending: Radiation Oncology | Admitting: Radiation Oncology

## 2017-02-06 DIAGNOSIS — M129 Arthropathy, unspecified: Secondary | ICD-10-CM | POA: Diagnosis not present

## 2017-02-06 DIAGNOSIS — C8338 Diffuse large B-cell lymphoma, lymph nodes of multiple sites: Secondary | ICD-10-CM | POA: Diagnosis not present

## 2017-02-06 DIAGNOSIS — N393 Stress incontinence (female) (male): Secondary | ICD-10-CM | POA: Diagnosis not present

## 2017-02-06 DIAGNOSIS — N183 Chronic kidney disease, stage 3 (moderate): Secondary | ICD-10-CM | POA: Diagnosis not present

## 2017-02-06 DIAGNOSIS — R5383 Other fatigue: Secondary | ICD-10-CM | POA: Diagnosis not present

## 2017-02-06 DIAGNOSIS — E119 Type 2 diabetes mellitus without complications: Secondary | ICD-10-CM | POA: Diagnosis not present

## 2017-02-06 DIAGNOSIS — C61 Malignant neoplasm of prostate: Secondary | ICD-10-CM | POA: Diagnosis not present

## 2017-02-06 DIAGNOSIS — Z510 Encounter for antineoplastic radiation therapy: Secondary | ICD-10-CM | POA: Diagnosis not present

## 2017-02-06 DIAGNOSIS — I129 Hypertensive chronic kidney disease with stage 1 through stage 4 chronic kidney disease, or unspecified chronic kidney disease: Secondary | ICD-10-CM | POA: Diagnosis not present

## 2017-02-06 NOTE — Progress Notes (Signed)
Hematology/Oncology Consult note [facility]  [phone] [phone]  Patient Care Team: Ezequiel Kayser, MD as PCP - General (Internal Medicine) Hollice Espy, MD as Consulting Physician (Urology)   Name of the patient: Christian Kelly  [id number]  02-Dec-1940   Date of visit: 02/06/17  Diagnosis- 1. 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 Stage II DLBCL GCB. FISH for double hit negative  Chief complaint/ Reason for visit- post treatment follow up  Heme/Onc history:1. Patient is a 76 yr old male with a h/o prostate cancer diagnosed in 1999s/p radical prostatectomy. Prior to that he had colon cancer in 1998 s/o surgery and adjuvant chemotherapy in 1998. With regards to prostate cancer- surgery was done at [facility] in [place] and he was followed at [place] subsequently. Patient think his Gleasons score was 7 at diagnosis. He has not required any radiation therapy or ADT so far. Patient still spends 4-5 months at [place].   2. Dr. Raechel Ache has been monitoring his PSArecently and his recent trend of PSA has been as follows: psa was 0.33 in feb 2017 and 0.63 in April 2018. We have 1 psa from 2015 when it was 0.3  3. Patient was referred to Dr. Erlene Quan urology and Dr. Baruch Gouty for salvage radiation.   4. Dr. Erlene Quan ordered CT abdomen which showed: IMPRESSION: 1. Status post prostatectomy, without locally recurrent disease. 2. Extensive abdominal adenopathy. Given the clinical history, most likely related to metastatic prostate carcinoma. Lymphoma could look similar. 3. Coronary artery atherosclerosis. Aortic atherosclerosis. 4. Vague upper sacral sclerosis is felt unlikely to represent metastatic disease, given lack of correlate on yesterday's bone scan. Correlate with radiation therapy, as radiation induced necrosis could have this appearance. Recommend attention on follow-up.  5. This was  followed by PET/CT scan which showed: IMPRESSION: 1. Hypermetabolic gastrohepatic ligament, abdominal retroperitoneal and small bowel mesenteric adenopathy, most indicative of lymphoma. 2. Aortic atherosclerosis (ICD10-170.0). Coronary artery Calcification.  6. Patient underwent CT-guided biopsy of the mesenteric lymph node which showed:DIAGNOSIS:  A. LYMPH NODE, MESENTERIC; CT-GUIDED CORE BIOPSY:  - LARGE B-CELL LYMPHOMA, CD10 POSITIVE.   Comment:  There is a diffuse proliferation of large lymphocytes with irregular  nuclear contours, inconspicuous nucleoli, and scant cytoplasm. The flow  cytometry and IHC results are most consistent with diffuse large B-cell  lymphoma, not otherwise specified, germinal center B-cell type. However,  it is likely that Swartz for MYC, BCL2 and BCL6 gene rearrangements will  be ordered, so final classification will be based on the [place] result  7. Bone marrow biopsy was negative for lymphoma  8. 17% of nuclei were positive for BCL-2 rearrangement. 35% of nuclei had extra myc signals but no gene rearrangement for cmyc noted. This was consistent with evolved lymphoma or DLBCL. Not consistentwithwith double hit or double expressor  9. Interim scans after 3 cycles of RCHOP showed: IMPRESSION: 2.2 x 5.3 cm jejunal mesenteric nodal mass along the anterior mid abdomen, significantly improved.   10.  PET/CT scan after 6 cycles of therapy showed mesenteric nodule mass was 5.9 x 2.3 cm with an SUV of 2.7.  This was discussed at tumor board.  Likelihood of this representing residual lymphoma is low given the low SUV uptake which has been similar to prior PET scan 3 months ago.  Plan is to proceed with consolidation radiation treatment followed by repeat PET/CT scans 3 months after that    Interval history-patient is currently undergoing consolidative radiation to his retroperitoneal  lymph nodes.  He is tolerating it well.  Denies any fatigue or unintentional  weight loss.  ECOG PS- 0 Pain scale- 0  Review of systems- Review of Systems  Constitutional: Negative for chills, fever, malaise/fatigue and weight loss.  HENT: Negative for congestion, ear discharge and nosebleeds.   Eyes: Negative for blurred vision.  Respiratory: Negative for cough, hemoptysis, sputum production, shortness of breath and wheezing.   Cardiovascular: Negative for chest pain, palpitations, orthopnea and claudication.  Gastrointestinal: Negative for abdominal pain, blood in stool, constipation, diarrhea, heartburn, melena, nausea and vomiting.  Genitourinary: Negative for dysuria, flank pain, frequency, hematuria and urgency.  Musculoskeletal: Negative for back pain, joint pain and myalgias.  Skin: Negative for rash.  Neurological: Negative for dizziness, tingling, focal weakness, seizures, weakness and headaches.  Endo/Heme/Allergies: Does not bruise/bleed easily.  Psychiatric/Behavioral: Negative for depression and suicidal ideas. The patient does not have insomnia.       Allergies  Allergen Reactions  . Tape Rash     Past Medical History:  Diagnosis Date  . Arthritis   . Basal cell carcinoma 2008   forehead  . Cancer ([place])   . Chronic renal insufficiency, stage III (moderate) (HCC)   . Colon cancer ([place]) 1998  . Diabetes mellitus without complication ([place])    Controlled with diet only as of 11/01/2016  . History of kidney stones   . Hyperlipemia   . Hypertension   . Lyme disease   . Prostate cancer ([place]) 1999     Past Surgical History:  Procedure Laterality Date  . COLON SURGERY  1998  . COLONOSCOPY    . COLONOSCOPY WITH PROPOFOL N/A 12/29/2014   Procedure: COLONOSCOPY WITH PROPOFOL;  Surgeon: Lollie Sails, MD;  Location[facility] ENDOSCOPY;  Service: Endoscopy;  Laterality: N/A;  . EYE SURGERY    . FRACTURE SURGERY    . IR FLUORO GUIDE PORT INSERTION RIGHT  09/01/2016  . LITHOTRIPSY    . PROSTATECTOMY  1998  . TONSILLECTOMY      Social  History   Socioeconomic History  . Marital status: Married    Spouse name: Not on file  . Number of children: Not on file  . Years of education: Not on file  . Highest education level: Not on file  Social Needs  . Financial resource strain: Not on file  . Food insecurity - worry: Not on file  . Food insecurity - inability: Not on file  . Transportation needs - medical: Not on file  . Transportation needs - non-medical: Not on file  Occupational History  . Not on file  Tobacco Use  . Smoking status: Former Smoker    Packs/day: 1.00    Years: 8.00    Pack years: 8.00    Types: Cigarettes    Last attempt to quit: 08/13/1974    Years since quitting: 42.5  . Smokeless tobacco: Former Systems developer    Types: Snuff  Substance and Sexual Activity  . Alcohol use: Yes    Comment: rarely-one or two beer  . Drug use: No  . Sexual activity: Not Currently  Other Topics Concern  . Not on file  Social History Narrative  . Not on file    Family History  Problem Relation Age of Onset  . Coronary artery disease Mother   . Diabetes Mother   . Prostate cancer Father   . Pancreatitis Father   . Bladder Cancer Sister   . Diabetes Brother   . Diabetes Brother   .  Prostate cancer Brother   . Diabetes Brother   . Diabetes Maternal Grandmother      Current Outpatient Medications:  .  ACCU-CHEK FASTCLIX LANCETS MISC, , Disp: , Rfl:  .  aspirin EC 81 MG tablet, Take 81 mg by mouth every other day. , Disp: , Rfl:  .  atorvastatin (LIPITOR) 40 MG tablet, Take 40 mg by mouth daily., Disp: , Rfl:  .  Blood Glucose Monitoring Suppl (TRUE METRIX AIR GLUCOSE METER) w/Device KIT, , Disp: , Rfl:  .  hydrochlorothiazide (HYDRODIURIL) 25 MG tablet, Take 25 mg by mouth daily., Disp: , Rfl:  .  lidocaine-prilocaine (EMLA) cream, Apply to affected area once, Disp: 30 g, Rfl: 3 .  LORazepam (ATIVAN) 0.5 MG tablet, Take 1 tablet (0.5 mg total) by mouth every 6 (six) hours as needed (Nausea or vomiting). (Patient  not taking: Reported on 01/17/2017), Disp: 30 tablet, Rfl: 0 .  Multiple Vitamin (MULTIVITAMIN) capsule, Take 1 capsule by mouth daily., Disp: , Rfl:  .  omeprazole (PRILOSEC) 20 MG capsule, TAKE 1 CAPSULE BY MOUTH ONCE DAILY (Patient not taking: Reported on 01/17/2017), Disp: 30 capsule, Rfl: 1 .  ondansetron (ZOFRAN) 8 MG tablet, Take 1 tablet (8 mg total) by mouth 2 (two) times daily as needed for refractory nausea / vomiting. Start on day 3 after cyclophosphamide chemotherapy. (Patient not taking: Reported on 01/17/2017), Disp: 30 tablet, Rfl: 1 .  predniSONE (DELTASONE) 50 MG tablet, Take 2 tablets (100 mg total) by mouth daily. Take on days 1-5 of each chemotherapy given every 3 weeks (Patient not taking: Reported on 01/17/2017), Disp: 30 tablet, Rfl: 0 .  prochlorperazine (COMPAZINE) 10 MG tablet, Take 1 tablet (10 mg total) by mouth every 6 (six) hours as needed (Nausea or vomiting). (Patient not taking: Reported on 01/17/2017), Disp: 30 tablet, Rfl: 6  Physical exam:  Vitals:   02/07/17 1123 02/07/17 1125  BP:  134/87  Pulse:  77  Resp:  16  Temp:  (!) 96.9 F (36.1 C)  TempSrc:  Tympanic  Weight: 187 lb (84.8 kg)    Physical Exam  Constitutional: He is oriented to person, place, and time and well-developed, well-nourished, and in no distress.  HENT:  Head: Normocephalic and atraumatic.  Eyes: EOM are normal. Pupils are equal, round, and reactive to light.  Neck: Normal range of motion.  Cardiovascular: Normal rate, regular rhythm and normal heart sounds.  Pulmonary/Chest: Effort normal and breath sounds normal.  Abdominal: Soft. Bowel sounds are normal.  Neurological: He is alert and oriented to person, place, and time.  Skin: Skin is warm and dry.     CMP Latest Ref Rng & Units 02/07/2017  Glucose 65 - 99 mg/dL 304(H)  BUN 6 - 20 mg/dL 20  Creatinine 0.61 - 1.24 mg/dL 1.04  Sodium 135 - 145 mmol/L 136  Potassium 3.5 - 5.1 mmol/L 3.9  Chloride 101 - 111 mmol/L 101  CO2  22 - 32 mmol/L 27  Calcium 8.9 - 10.3 mg/dL 8.9  Total Protein 6.5 - 8.1 g/dL 6.4(L)  Total Bilirubin 0.3 - 1.2 mg/dL 0.6  Alkaline Phos 38 - 126 U/L 83  AST 15 - 41 U/L 32  ALT 17 - 63 U/L 33   CBC Latest Ref Rng & Units 02/07/2017  WBC 3.8 - 10.6 K/uL 5.1  Hemoglobin 13.0 - 18.0 g/dL 12.7(L)  Hematocrit 40.0 - 52.0 % 38.3(L)  Platelets 150 - 440 K/uL 188     Assessment and plan- Patient is  a 76 y.o. male with stage II diffuse large B-cell lymphoma GCB status post 6 cycles of R CHOP chemotherapy  Patient is completed 6 cycles of R CHOP chemotherapy and is currently undergoing consolidative radiation to his retroperitoneal lymph nodes.  He will be completing radiation on 03/03/2017.  I will plan to get a repeat PET scan in early April.  I will see him after the PET scan with a CBC CMP and LDH.  Prostate cancer: PSA is slowly going up from 0.6 in May to 0.9 this month.  He will be undergoing salvage radiation treatment of his prostate 1 month after he completes radiation treatment of his retroperitoneal lymph nodes.  I will however proceed with PET scan after his radiation to retroperitoneal lymph nodes  Chemo induced anemia: Improved continue to monitor   Visit Diagnosis 1. Diffuse large B-cell lymphoma of intra-abdominal lymph nodes (HCC)      Dr. Randa Evens, MD, MPH [facility] Pager- [phone] 02/07/2017 11:51 AM

## 2017-02-07 ENCOUNTER — Ambulatory Visit: Payer: Medicare HMO

## 2017-02-07 ENCOUNTER — Encounter: Payer: Self-pay | Admitting: Oncology

## 2017-02-07 ENCOUNTER — Inpatient Hospital Stay: Payer: Medicare HMO | Attending: Oncology

## 2017-02-07 ENCOUNTER — Inpatient Hospital Stay (HOSPITAL_BASED_OUTPATIENT_CLINIC_OR_DEPARTMENT_OTHER): Payer: Medicare HMO | Admitting: Oncology

## 2017-02-07 ENCOUNTER — Ambulatory Visit
Admission: RE | Admit: 2017-02-07 | Discharge: 2017-02-07 | Disposition: A | Discharge status: Home / Self Care | Payer: Medicare HMO | Source: Ambulatory Visit | Attending: Radiation Oncology | Admitting: Radiation Oncology

## 2017-02-07 VITALS — BP 134/87 | HR 77 | Temp 96.9°F | Resp 16 | Wt 187.0 lb

## 2017-02-07 DIAGNOSIS — Z510 Encounter for antineoplastic radiation therapy: Secondary | ICD-10-CM | POA: Diagnosis not present

## 2017-02-07 DIAGNOSIS — E785 Hyperlipidemia, unspecified: Secondary | ICD-10-CM | POA: Diagnosis not present

## 2017-02-07 DIAGNOSIS — N183 Chronic kidney disease, stage 3 (moderate): Secondary | ICD-10-CM

## 2017-02-07 DIAGNOSIS — C8338 Diffuse large B-cell lymphoma, lymph nodes of multiple sites: Secondary | ICD-10-CM | POA: Diagnosis not present

## 2017-02-07 DIAGNOSIS — Z79899 Other long term (current) drug therapy: Secondary | ICD-10-CM | POA: Insufficient documentation

## 2017-02-07 DIAGNOSIS — Z87442 Personal history of urinary calculi: Secondary | ICD-10-CM | POA: Diagnosis not present

## 2017-02-07 DIAGNOSIS — C61 Malignant neoplasm of prostate: Secondary | ICD-10-CM

## 2017-02-07 DIAGNOSIS — C8333 Diffuse large B-cell lymphoma, intra-abdominal lymph nodes: Secondary | ICD-10-CM | POA: Insufficient documentation

## 2017-02-07 DIAGNOSIS — D6481 Anemia due to antineoplastic chemotherapy: Secondary | ICD-10-CM

## 2017-02-07 DIAGNOSIS — I700 Atherosclerosis of aorta: Secondary | ICD-10-CM | POA: Insufficient documentation

## 2017-02-07 DIAGNOSIS — E119 Type 2 diabetes mellitus without complications: Secondary | ICD-10-CM | POA: Insufficient documentation

## 2017-02-07 DIAGNOSIS — Z85038 Personal history of other malignant neoplasm of large intestine: Secondary | ICD-10-CM | POA: Insufficient documentation

## 2017-02-07 DIAGNOSIS — Z85828 Personal history of other malignant neoplasm of skin: Secondary | ICD-10-CM

## 2017-02-07 DIAGNOSIS — N393 Stress incontinence (female) (male): Secondary | ICD-10-CM | POA: Diagnosis not present

## 2017-02-07 DIAGNOSIS — I129 Hypertensive chronic kidney disease with stage 1 through stage 4 chronic kidney disease, or unspecified chronic kidney disease: Secondary | ICD-10-CM | POA: Insufficient documentation

## 2017-02-07 DIAGNOSIS — Z87891 Personal history of nicotine dependence: Secondary | ICD-10-CM

## 2017-02-07 DIAGNOSIS — I2510 Atherosclerotic heart disease of native coronary artery without angina pectoris: Secondary | ICD-10-CM | POA: Insufficient documentation

## 2017-02-07 DIAGNOSIS — Z9221 Personal history of antineoplastic chemotherapy: Secondary | ICD-10-CM | POA: Diagnosis not present

## 2017-02-07 DIAGNOSIS — R5383 Other fatigue: Secondary | ICD-10-CM | POA: Diagnosis not present

## 2017-02-07 DIAGNOSIS — M129 Arthropathy, unspecified: Secondary | ICD-10-CM | POA: Diagnosis not present

## 2017-02-07 DIAGNOSIS — Z95828 Presence of other vascular implants and grafts: Secondary | ICD-10-CM

## 2017-02-07 LAB — CBC WITH DIFFERENTIAL/PLATELET
BASOS PCT: 1 %
Basophils Absolute: 0 10*3/uL (ref 0–0.1)
EOS ABS: 0.2 10*3/uL (ref 0–0.7)
Eosinophils Relative: 4 %
HCT: 38.3 % — ABNORMAL LOW (ref 40.0–52.0)
HEMOGLOBIN: 12.7 g/dL — AB (ref 13.0–18.0)
Lymphocytes Relative: 15 %
Lymphs Abs: 0.7 10*3/uL — ABNORMAL LOW (ref 1.0–3.6)
MCH: 31.7 pg (ref 26.0–34.0)
MCHC: 33.3 g/dL (ref 32.0–36.0)
MCV: 95.1 fL (ref 80.0–100.0)
Monocytes Absolute: 0.8 10*3/uL (ref 0.2–1.0)
Monocytes Relative: 16 %
NEUTROS PCT: 64 %
Neutro Abs: 3.3 10*3/uL (ref 1.4–6.5)
Platelets: 188 10*3/uL (ref 150–440)
RBC: 4.02 MIL/uL — AB (ref 4.40–5.90)
RDW: 13.6 % (ref 11.5–14.5)
WBC: 5.1 10*3/uL (ref 3.8–10.6)

## 2017-02-07 LAB — COMPREHENSIVE METABOLIC PANEL
ALBUMIN: 3.8 g/dL (ref 3.5–5.0)
ALK PHOS: 83 U/L (ref 38–126)
ALT: 33 U/L (ref 17–63)
ANION GAP: 8 (ref 5–15)
AST: 32 U/L (ref 15–41)
BUN: 20 mg/dL (ref 6–20)
CALCIUM: 8.9 mg/dL (ref 8.9–10.3)
CHLORIDE: 101 mmol/L (ref 101–111)
CO2: 27 mmol/L (ref 22–32)
Creatinine, Ser: 1.04 mg/dL (ref 0.61–1.24)
GFR calc Af Amer: >60 mL/min (ref >60)
GFR calc non Af Amer: >60 mL/min (ref >60)
Glucose, Bld: 304 mg/dL — ABNORMAL HIGH (ref 65–99)
Potassium: 3.9 mmol/L (ref 3.5–5.1)
SODIUM: 136 mmol/L (ref 135–145)
TOTAL PROTEIN: 6.4 g/dL — AB (ref 6.5–8.1)
Total Bilirubin: 0.6 mg/dL (ref 0.3–1.2)

## 2017-02-07 MED ORDER — SODIUM CHLORIDE 0.9% FLUSH
10.0000 mL | INTRAVENOUS | Status: AC | PRN
  Administered 2017-02-07: 10 mL
  Filled 2017-02-07: qty 10

## 2017-02-07 MED ORDER — HEPARIN SOD (PORK) LOCK FLUSH 100 UNIT/ML IV SOLN
500.0000 [IU] | INTRAVENOUS | Status: AC | PRN
  Administered 2017-02-07: 500 [IU]

## 2017-02-08 ENCOUNTER — Ambulatory Visit
Admission: RE | Admit: 2017-02-08 | Discharge: 2017-02-08 | Disposition: A | Discharge status: Home / Self Care | Payer: Medicare HMO | Source: Ambulatory Visit | Attending: Radiation Oncology | Admitting: Radiation Oncology

## 2017-02-08 ENCOUNTER — Ambulatory Visit: Payer: Medicare HMO

## 2017-02-08 DIAGNOSIS — E119 Type 2 diabetes mellitus without complications: Secondary | ICD-10-CM | POA: Diagnosis not present

## 2017-02-08 DIAGNOSIS — Z510 Encounter for antineoplastic radiation therapy: Secondary | ICD-10-CM | POA: Diagnosis not present

## 2017-02-08 DIAGNOSIS — R5383 Other fatigue: Secondary | ICD-10-CM | POA: Diagnosis not present

## 2017-02-08 DIAGNOSIS — C8338 Diffuse large B-cell lymphoma, lymph nodes of multiple sites: Secondary | ICD-10-CM | POA: Diagnosis not present

## 2017-02-08 DIAGNOSIS — N393 Stress incontinence (female) (male): Secondary | ICD-10-CM | POA: Diagnosis not present

## 2017-02-08 DIAGNOSIS — M129 Arthropathy, unspecified: Secondary | ICD-10-CM | POA: Diagnosis not present

## 2017-02-08 DIAGNOSIS — I129 Hypertensive chronic kidney disease with stage 1 through stage 4 chronic kidney disease, or unspecified chronic kidney disease: Secondary | ICD-10-CM | POA: Diagnosis not present

## 2017-02-08 DIAGNOSIS — N183 Chronic kidney disease, stage 3 (moderate): Secondary | ICD-10-CM | POA: Diagnosis not present

## 2017-02-08 DIAGNOSIS — C61 Malignant neoplasm of prostate: Secondary | ICD-10-CM | POA: Diagnosis not present

## 2017-02-09 ENCOUNTER — Other Ambulatory Visit: Payer: Self-pay | Admitting: *Deleted

## 2017-02-09 ENCOUNTER — Ambulatory Visit: Payer: Medicare HMO

## 2017-02-09 ENCOUNTER — Ambulatory Visit
Admission: RE | Admit: 2017-02-09 | Discharge: 2017-02-09 | Disposition: A | Discharge status: Home / Self Care | Payer: Medicare HMO | Source: Ambulatory Visit | Attending: Radiation Oncology | Admitting: Radiation Oncology

## 2017-02-09 DIAGNOSIS — N183 Chronic kidney disease, stage 3 (moderate): Secondary | ICD-10-CM | POA: Diagnosis not present

## 2017-02-09 DIAGNOSIS — C61 Malignant neoplasm of prostate: Secondary | ICD-10-CM | POA: Diagnosis not present

## 2017-02-09 DIAGNOSIS — Z510 Encounter for antineoplastic radiation therapy: Secondary | ICD-10-CM | POA: Diagnosis not present

## 2017-02-09 DIAGNOSIS — R5383 Other fatigue: Secondary | ICD-10-CM | POA: Diagnosis not present

## 2017-02-09 DIAGNOSIS — I129 Hypertensive chronic kidney disease with stage 1 through stage 4 chronic kidney disease, or unspecified chronic kidney disease: Secondary | ICD-10-CM | POA: Diagnosis not present

## 2017-02-09 DIAGNOSIS — E119 Type 2 diabetes mellitus without complications: Secondary | ICD-10-CM | POA: Diagnosis not present

## 2017-02-09 DIAGNOSIS — N393 Stress incontinence (female) (male): Secondary | ICD-10-CM | POA: Diagnosis not present

## 2017-02-09 DIAGNOSIS — M129 Arthropathy, unspecified: Secondary | ICD-10-CM | POA: Diagnosis not present

## 2017-02-09 DIAGNOSIS — C8338 Diffuse large B-cell lymphoma, lymph nodes of multiple sites: Secondary | ICD-10-CM | POA: Diagnosis not present

## 2017-02-10 ENCOUNTER — Ambulatory Visit: Payer: Medicare HMO

## 2017-02-10 ENCOUNTER — Ambulatory Visit
Admission: RE | Admit: 2017-02-10 | Discharge: 2017-02-10 | Disposition: A | Discharge status: Home / Self Care | Payer: Medicare HMO | Source: Ambulatory Visit | Attending: Radiation Oncology | Admitting: Radiation Oncology

## 2017-02-10 DIAGNOSIS — R5383 Other fatigue: Secondary | ICD-10-CM | POA: Diagnosis not present

## 2017-02-10 DIAGNOSIS — C61 Malignant neoplasm of prostate: Secondary | ICD-10-CM | POA: Diagnosis not present

## 2017-02-10 DIAGNOSIS — M129 Arthropathy, unspecified: Secondary | ICD-10-CM | POA: Diagnosis not present

## 2017-02-10 DIAGNOSIS — C8338 Diffuse large B-cell lymphoma, lymph nodes of multiple sites: Secondary | ICD-10-CM | POA: Diagnosis not present

## 2017-02-10 DIAGNOSIS — N183 Chronic kidney disease, stage 3 (moderate): Secondary | ICD-10-CM | POA: Diagnosis not present

## 2017-02-10 DIAGNOSIS — Z510 Encounter for antineoplastic radiation therapy: Secondary | ICD-10-CM | POA: Diagnosis not present

## 2017-02-10 DIAGNOSIS — E119 Type 2 diabetes mellitus without complications: Secondary | ICD-10-CM | POA: Diagnosis not present

## 2017-02-10 DIAGNOSIS — N393 Stress incontinence (female) (male): Secondary | ICD-10-CM | POA: Diagnosis not present

## 2017-02-10 DIAGNOSIS — I129 Hypertensive chronic kidney disease with stage 1 through stage 4 chronic kidney disease, or unspecified chronic kidney disease: Secondary | ICD-10-CM | POA: Diagnosis not present

## 2017-02-13 ENCOUNTER — Ambulatory Visit: Payer: Medicare HMO

## 2017-02-15 ENCOUNTER — Ambulatory Visit: Payer: Medicare HMO

## 2017-02-15 ENCOUNTER — Ambulatory Visit
Admission: RE | Admit: 2017-02-15 | Discharge: 2017-02-15 | Disposition: A | Discharge status: Home / Self Care | Payer: Medicare HMO | Source: Ambulatory Visit | Attending: Radiation Oncology | Admitting: Radiation Oncology

## 2017-02-15 ENCOUNTER — Inpatient Hospital Stay: Payer: Medicare HMO

## 2017-02-15 DIAGNOSIS — I700 Atherosclerosis of aorta: Secondary | ICD-10-CM | POA: Diagnosis not present

## 2017-02-15 DIAGNOSIS — E785 Hyperlipidemia, unspecified: Secondary | ICD-10-CM | POA: Diagnosis not present

## 2017-02-15 DIAGNOSIS — I2510 Atherosclerotic heart disease of native coronary artery without angina pectoris: Secondary | ICD-10-CM | POA: Diagnosis not present

## 2017-02-15 DIAGNOSIS — C8338 Diffuse large B-cell lymphoma, lymph nodes of multiple sites: Secondary | ICD-10-CM

## 2017-02-15 DIAGNOSIS — Z510 Encounter for antineoplastic radiation therapy: Secondary | ICD-10-CM | POA: Diagnosis not present

## 2017-02-15 DIAGNOSIS — C8333 Diffuse large B-cell lymphoma, intra-abdominal lymph nodes: Secondary | ICD-10-CM | POA: Diagnosis not present

## 2017-02-15 DIAGNOSIS — E119 Type 2 diabetes mellitus without complications: Secondary | ICD-10-CM | POA: Diagnosis not present

## 2017-02-15 DIAGNOSIS — C61 Malignant neoplasm of prostate: Secondary | ICD-10-CM | POA: Diagnosis not present

## 2017-02-15 DIAGNOSIS — N183 Chronic kidney disease, stage 3 (moderate): Secondary | ICD-10-CM | POA: Diagnosis not present

## 2017-02-15 DIAGNOSIS — R5383 Other fatigue: Secondary | ICD-10-CM | POA: Diagnosis not present

## 2017-02-15 DIAGNOSIS — I129 Hypertensive chronic kidney disease with stage 1 through stage 4 chronic kidney disease, or unspecified chronic kidney disease: Secondary | ICD-10-CM | POA: Diagnosis not present

## 2017-02-15 DIAGNOSIS — M129 Arthropathy, unspecified: Secondary | ICD-10-CM | POA: Diagnosis not present

## 2017-02-15 DIAGNOSIS — D6481 Anemia due to antineoplastic chemotherapy: Secondary | ICD-10-CM | POA: Diagnosis not present

## 2017-02-15 DIAGNOSIS — N393 Stress incontinence (female) (male): Secondary | ICD-10-CM | POA: Diagnosis not present

## 2017-02-15 LAB — CBC
HEMATOCRIT: 40.1 % (ref 40.0–52.0)
HEMOGLOBIN: 13.3 g/dL (ref 13.0–18.0)
MCH: 31.4 pg (ref 26.0–34.0)
MCHC: 33.1 g/dL (ref 32.0–36.0)
MCV: 94.9 fL (ref 80.0–100.0)
Platelets: 208 10*3/uL (ref 150–440)
RBC: 4.23 MIL/uL — AB (ref 4.40–5.90)
RDW: 13.1 % (ref 11.5–14.5)
WBC: 5.9 10*3/uL (ref 3.8–10.6)

## 2017-02-16 ENCOUNTER — Ambulatory Visit
Admission: RE | Admit: 2017-02-16 | Discharge: 2017-02-16 | Disposition: A | Discharge status: Home / Self Care | Payer: Medicare HMO | Source: Ambulatory Visit | Attending: Radiation Oncology | Admitting: Radiation Oncology

## 2017-02-16 ENCOUNTER — Ambulatory Visit: Payer: Medicare HMO

## 2017-02-16 DIAGNOSIS — M129 Arthropathy, unspecified: Secondary | ICD-10-CM | POA: Diagnosis not present

## 2017-02-16 DIAGNOSIS — C61 Malignant neoplasm of prostate: Secondary | ICD-10-CM | POA: Diagnosis not present

## 2017-02-16 DIAGNOSIS — E119 Type 2 diabetes mellitus without complications: Secondary | ICD-10-CM | POA: Diagnosis not present

## 2017-02-16 DIAGNOSIS — C8338 Diffuse large B-cell lymphoma, lymph nodes of multiple sites: Secondary | ICD-10-CM | POA: Diagnosis not present

## 2017-02-16 DIAGNOSIS — N393 Stress incontinence (female) (male): Secondary | ICD-10-CM | POA: Diagnosis not present

## 2017-02-16 DIAGNOSIS — N183 Chronic kidney disease, stage 3 (moderate): Secondary | ICD-10-CM | POA: Diagnosis not present

## 2017-02-16 DIAGNOSIS — R5383 Other fatigue: Secondary | ICD-10-CM | POA: Diagnosis not present

## 2017-02-16 DIAGNOSIS — Z510 Encounter for antineoplastic radiation therapy: Secondary | ICD-10-CM | POA: Diagnosis not present

## 2017-02-16 DIAGNOSIS — I129 Hypertensive chronic kidney disease with stage 1 through stage 4 chronic kidney disease, or unspecified chronic kidney disease: Secondary | ICD-10-CM | POA: Diagnosis not present

## 2017-02-17 ENCOUNTER — Ambulatory Visit: Payer: Medicare HMO

## 2017-02-20 ENCOUNTER — Ambulatory Visit
Admission: RE | Admit: 2017-02-20 | Discharge: 2017-02-20 | Disposition: A | Discharge status: Home / Self Care | Payer: Medicare HMO | Source: Ambulatory Visit | Attending: Radiation Oncology | Admitting: Radiation Oncology

## 2017-02-20 ENCOUNTER — Ambulatory Visit: Payer: Medicare HMO

## 2017-02-20 DIAGNOSIS — M129 Arthropathy, unspecified: Secondary | ICD-10-CM | POA: Diagnosis not present

## 2017-02-20 DIAGNOSIS — E119 Type 2 diabetes mellitus without complications: Secondary | ICD-10-CM | POA: Diagnosis not present

## 2017-02-20 DIAGNOSIS — C8338 Diffuse large B-cell lymphoma, lymph nodes of multiple sites: Secondary | ICD-10-CM | POA: Diagnosis not present

## 2017-02-20 DIAGNOSIS — R5383 Other fatigue: Secondary | ICD-10-CM | POA: Diagnosis not present

## 2017-02-20 DIAGNOSIS — N393 Stress incontinence (female) (male): Secondary | ICD-10-CM | POA: Diagnosis not present

## 2017-02-20 DIAGNOSIS — Z510 Encounter for antineoplastic radiation therapy: Secondary | ICD-10-CM | POA: Diagnosis not present

## 2017-02-20 DIAGNOSIS — C61 Malignant neoplasm of prostate: Secondary | ICD-10-CM | POA: Diagnosis not present

## 2017-02-20 DIAGNOSIS — I129 Hypertensive chronic kidney disease with stage 1 through stage 4 chronic kidney disease, or unspecified chronic kidney disease: Secondary | ICD-10-CM | POA: Diagnosis not present

## 2017-02-20 DIAGNOSIS — N183 Chronic kidney disease, stage 3 (moderate): Secondary | ICD-10-CM | POA: Diagnosis not present

## 2017-02-22 ENCOUNTER — Ambulatory Visit
Admission: RE | Admit: 2017-02-22 | Discharge: 2017-02-22 | Disposition: A | Discharge status: Home / Self Care | Payer: Medicare HMO | Source: Ambulatory Visit | Attending: Radiation Oncology | Admitting: Radiation Oncology

## 2017-02-22 ENCOUNTER — Inpatient Hospital Stay: Payer: Medicare HMO | Attending: Radiation Oncology

## 2017-02-22 ENCOUNTER — Ambulatory Visit: Payer: Medicare HMO

## 2017-02-22 ENCOUNTER — Inpatient Hospital Stay: Payer: Medicare HMO

## 2017-02-22 DIAGNOSIS — N393 Stress incontinence (female) (male): Secondary | ICD-10-CM | POA: Diagnosis not present

## 2017-02-22 DIAGNOSIS — C8293 Follicular lymphoma, unspecified, intra-abdominal lymph nodes: Secondary | ICD-10-CM | POA: Diagnosis not present

## 2017-02-22 DIAGNOSIS — C8338 Diffuse large B-cell lymphoma, lymph nodes of multiple sites: Secondary | ICD-10-CM | POA: Diagnosis not present

## 2017-02-22 DIAGNOSIS — N183 Chronic kidney disease, stage 3 (moderate): Secondary | ICD-10-CM | POA: Diagnosis not present

## 2017-02-22 DIAGNOSIS — Z510 Encounter for antineoplastic radiation therapy: Secondary | ICD-10-CM | POA: Diagnosis not present

## 2017-02-22 DIAGNOSIS — E119 Type 2 diabetes mellitus without complications: Secondary | ICD-10-CM | POA: Diagnosis not present

## 2017-02-22 DIAGNOSIS — M129 Arthropathy, unspecified: Secondary | ICD-10-CM | POA: Diagnosis not present

## 2017-02-22 DIAGNOSIS — R5383 Other fatigue: Secondary | ICD-10-CM | POA: Diagnosis not present

## 2017-02-22 DIAGNOSIS — C61 Malignant neoplasm of prostate: Secondary | ICD-10-CM | POA: Diagnosis not present

## 2017-02-22 DIAGNOSIS — I129 Hypertensive chronic kidney disease with stage 1 through stage 4 chronic kidney disease, or unspecified chronic kidney disease: Secondary | ICD-10-CM | POA: Diagnosis not present

## 2017-02-22 LAB — CBC
HCT: 40.5 % (ref 40.0–52.0)
Hemoglobin: 13.6 g/dL (ref 13.0–18.0)
MCH: 31.6 pg (ref 26.0–34.0)
MCHC: 33.6 g/dL (ref 32.0–36.0)
MCV: 94.2 fL (ref 80.0–100.0)
Platelets: 194 10*3/uL (ref 150–440)
RBC: 4.3 MIL/uL — ABNORMAL LOW (ref 4.40–5.90)
RDW: 13.1 % (ref 11.5–14.5)
WBC: 5.6 10*3/uL (ref 3.8–10.6)

## 2017-02-23 ENCOUNTER — Ambulatory Visit
Admission: RE | Admit: 2017-02-23 | Discharge: 2017-02-23 | Disposition: A | Discharge status: Home / Self Care | Payer: Medicare HMO | Source: Ambulatory Visit | Attending: Radiation Oncology | Admitting: Radiation Oncology

## 2017-02-23 ENCOUNTER — Ambulatory Visit: Payer: Medicare HMO

## 2017-02-23 DIAGNOSIS — N183 Chronic kidney disease, stage 3 (moderate): Secondary | ICD-10-CM | POA: Diagnosis not present

## 2017-02-23 DIAGNOSIS — Z510 Encounter for antineoplastic radiation therapy: Secondary | ICD-10-CM | POA: Diagnosis not present

## 2017-02-23 DIAGNOSIS — C61 Malignant neoplasm of prostate: Secondary | ICD-10-CM | POA: Diagnosis not present

## 2017-02-23 DIAGNOSIS — M129 Arthropathy, unspecified: Secondary | ICD-10-CM | POA: Diagnosis not present

## 2017-02-23 DIAGNOSIS — R5383 Other fatigue: Secondary | ICD-10-CM | POA: Diagnosis not present

## 2017-02-23 DIAGNOSIS — C8338 Diffuse large B-cell lymphoma, lymph nodes of multiple sites: Secondary | ICD-10-CM | POA: Diagnosis not present

## 2017-02-23 DIAGNOSIS — N393 Stress incontinence (female) (male): Secondary | ICD-10-CM | POA: Diagnosis not present

## 2017-02-23 DIAGNOSIS — I129 Hypertensive chronic kidney disease with stage 1 through stage 4 chronic kidney disease, or unspecified chronic kidney disease: Secondary | ICD-10-CM | POA: Diagnosis not present

## 2017-02-23 DIAGNOSIS — E119 Type 2 diabetes mellitus without complications: Secondary | ICD-10-CM | POA: Diagnosis not present

## 2017-02-24 ENCOUNTER — Ambulatory Visit: Payer: Medicare HMO

## 2017-02-24 ENCOUNTER — Ambulatory Visit
Admission: RE | Admit: 2017-02-24 | Discharge: 2017-02-24 | Disposition: A | Discharge status: Home / Self Care | Payer: Medicare HMO | Source: Ambulatory Visit | Attending: Radiation Oncology | Admitting: Radiation Oncology

## 2017-02-24 DIAGNOSIS — N393 Stress incontinence (female) (male): Secondary | ICD-10-CM | POA: Diagnosis not present

## 2017-02-24 DIAGNOSIS — C61 Malignant neoplasm of prostate: Secondary | ICD-10-CM | POA: Diagnosis not present

## 2017-02-24 DIAGNOSIS — N183 Chronic kidney disease, stage 3 (moderate): Secondary | ICD-10-CM | POA: Diagnosis not present

## 2017-02-24 DIAGNOSIS — I129 Hypertensive chronic kidney disease with stage 1 through stage 4 chronic kidney disease, or unspecified chronic kidney disease: Secondary | ICD-10-CM | POA: Diagnosis not present

## 2017-02-24 DIAGNOSIS — M129 Arthropathy, unspecified: Secondary | ICD-10-CM | POA: Diagnosis not present

## 2017-02-24 DIAGNOSIS — R5383 Other fatigue: Secondary | ICD-10-CM | POA: Diagnosis not present

## 2017-02-24 DIAGNOSIS — C8338 Diffuse large B-cell lymphoma, lymph nodes of multiple sites: Secondary | ICD-10-CM | POA: Diagnosis not present

## 2017-02-24 DIAGNOSIS — E119 Type 2 diabetes mellitus without complications: Secondary | ICD-10-CM | POA: Diagnosis not present

## 2017-02-24 DIAGNOSIS — Z510 Encounter for antineoplastic radiation therapy: Secondary | ICD-10-CM | POA: Diagnosis not present

## 2017-02-27 ENCOUNTER — Ambulatory Visit
Admission: RE | Admit: 2017-02-27 | Discharge: 2017-02-27 | Disposition: A | Discharge status: Home / Self Care | Payer: Medicare HMO | Source: Ambulatory Visit | Attending: Radiation Oncology | Admitting: Radiation Oncology

## 2017-02-27 ENCOUNTER — Ambulatory Visit: Payer: Medicare HMO

## 2017-02-27 DIAGNOSIS — N393 Stress incontinence (female) (male): Secondary | ICD-10-CM | POA: Diagnosis not present

## 2017-02-27 DIAGNOSIS — I129 Hypertensive chronic kidney disease with stage 1 through stage 4 chronic kidney disease, or unspecified chronic kidney disease: Secondary | ICD-10-CM | POA: Diagnosis not present

## 2017-02-27 DIAGNOSIS — C61 Malignant neoplasm of prostate: Secondary | ICD-10-CM | POA: Diagnosis not present

## 2017-02-27 DIAGNOSIS — N183 Chronic kidney disease, stage 3 (moderate): Secondary | ICD-10-CM | POA: Diagnosis not present

## 2017-02-27 DIAGNOSIS — M129 Arthropathy, unspecified: Secondary | ICD-10-CM | POA: Diagnosis not present

## 2017-02-27 DIAGNOSIS — Z510 Encounter for antineoplastic radiation therapy: Secondary | ICD-10-CM | POA: Diagnosis not present

## 2017-02-27 DIAGNOSIS — R5383 Other fatigue: Secondary | ICD-10-CM | POA: Diagnosis not present

## 2017-02-27 DIAGNOSIS — C8338 Diffuse large B-cell lymphoma, lymph nodes of multiple sites: Secondary | ICD-10-CM | POA: Diagnosis not present

## 2017-02-27 DIAGNOSIS — E119 Type 2 diabetes mellitus without complications: Secondary | ICD-10-CM | POA: Diagnosis not present

## 2017-02-28 ENCOUNTER — Ambulatory Visit
Admission: RE | Admit: 2017-02-28 | Discharge: 2017-02-28 | Disposition: A | Discharge status: Home / Self Care | Payer: Medicare HMO | Source: Ambulatory Visit | Attending: Radiation Oncology | Admitting: Radiation Oncology

## 2017-02-28 DIAGNOSIS — E119 Type 2 diabetes mellitus without complications: Secondary | ICD-10-CM | POA: Diagnosis not present

## 2017-02-28 DIAGNOSIS — N183 Chronic kidney disease, stage 3 (moderate): Secondary | ICD-10-CM | POA: Diagnosis not present

## 2017-02-28 DIAGNOSIS — N393 Stress incontinence (female) (male): Secondary | ICD-10-CM | POA: Diagnosis not present

## 2017-02-28 DIAGNOSIS — C61 Malignant neoplasm of prostate: Secondary | ICD-10-CM | POA: Diagnosis not present

## 2017-02-28 DIAGNOSIS — I129 Hypertensive chronic kidney disease with stage 1 through stage 4 chronic kidney disease, or unspecified chronic kidney disease: Secondary | ICD-10-CM | POA: Diagnosis not present

## 2017-02-28 DIAGNOSIS — C8338 Diffuse large B-cell lymphoma, lymph nodes of multiple sites: Secondary | ICD-10-CM | POA: Diagnosis not present

## 2017-02-28 DIAGNOSIS — M129 Arthropathy, unspecified: Secondary | ICD-10-CM | POA: Diagnosis not present

## 2017-02-28 DIAGNOSIS — Z510 Encounter for antineoplastic radiation therapy: Secondary | ICD-10-CM | POA: Diagnosis not present

## 2017-02-28 DIAGNOSIS — R5383 Other fatigue: Secondary | ICD-10-CM | POA: Diagnosis not present

## 2017-03-01 ENCOUNTER — Ambulatory Visit
Admission: RE | Admit: 2017-03-01 | Discharge: 2017-03-01 | Disposition: A | Discharge status: Home / Self Care | Payer: Medicare HMO | Source: Ambulatory Visit | Attending: Radiation Oncology | Admitting: Radiation Oncology

## 2017-03-01 DIAGNOSIS — R5383 Other fatigue: Secondary | ICD-10-CM | POA: Diagnosis not present

## 2017-03-01 DIAGNOSIS — N183 Chronic kidney disease, stage 3 (moderate): Secondary | ICD-10-CM | POA: Diagnosis not present

## 2017-03-01 DIAGNOSIS — C61 Malignant neoplasm of prostate: Secondary | ICD-10-CM | POA: Diagnosis not present

## 2017-03-01 DIAGNOSIS — Z510 Encounter for antineoplastic radiation therapy: Secondary | ICD-10-CM | POA: Diagnosis not present

## 2017-03-01 DIAGNOSIS — M129 Arthropathy, unspecified: Secondary | ICD-10-CM | POA: Diagnosis not present

## 2017-03-01 DIAGNOSIS — C8338 Diffuse large B-cell lymphoma, lymph nodes of multiple sites: Secondary | ICD-10-CM | POA: Diagnosis not present

## 2017-03-01 DIAGNOSIS — N393 Stress incontinence (female) (male): Secondary | ICD-10-CM | POA: Diagnosis not present

## 2017-03-01 DIAGNOSIS — E119 Type 2 diabetes mellitus without complications: Secondary | ICD-10-CM | POA: Diagnosis not present

## 2017-03-01 DIAGNOSIS — I129 Hypertensive chronic kidney disease with stage 1 through stage 4 chronic kidney disease, or unspecified chronic kidney disease: Secondary | ICD-10-CM | POA: Diagnosis not present

## 2017-03-02 ENCOUNTER — Ambulatory Visit: Payer: Medicare HMO

## 2017-03-02 ENCOUNTER — Ambulatory Visit
Admission: RE | Admit: 2017-03-02 | Discharge: 2017-03-02 | Disposition: A | Discharge status: Home / Self Care | Payer: Medicare HMO | Source: Ambulatory Visit | Attending: Radiation Oncology | Admitting: Radiation Oncology

## 2017-03-02 DIAGNOSIS — M129 Arthropathy, unspecified: Secondary | ICD-10-CM | POA: Diagnosis not present

## 2017-03-02 DIAGNOSIS — N183 Chronic kidney disease, stage 3 (moderate): Secondary | ICD-10-CM | POA: Diagnosis not present

## 2017-03-02 DIAGNOSIS — Z510 Encounter for antineoplastic radiation therapy: Secondary | ICD-10-CM | POA: Diagnosis not present

## 2017-03-02 DIAGNOSIS — N393 Stress incontinence (female) (male): Secondary | ICD-10-CM | POA: Diagnosis not present

## 2017-03-02 DIAGNOSIS — C8338 Diffuse large B-cell lymphoma, lymph nodes of multiple sites: Secondary | ICD-10-CM | POA: Diagnosis not present

## 2017-03-02 DIAGNOSIS — C61 Malignant neoplasm of prostate: Secondary | ICD-10-CM | POA: Diagnosis not present

## 2017-03-02 DIAGNOSIS — I129 Hypertensive chronic kidney disease with stage 1 through stage 4 chronic kidney disease, or unspecified chronic kidney disease: Secondary | ICD-10-CM | POA: Diagnosis not present

## 2017-03-02 DIAGNOSIS — R5383 Other fatigue: Secondary | ICD-10-CM | POA: Diagnosis not present

## 2017-03-02 DIAGNOSIS — E119 Type 2 diabetes mellitus without complications: Secondary | ICD-10-CM | POA: Diagnosis not present

## 2017-03-03 ENCOUNTER — Ambulatory Visit
Admission: RE | Admit: 2017-03-03 | Discharge: 2017-03-03 | Disposition: A | Discharge status: Home / Self Care | Payer: Medicare HMO | Source: Ambulatory Visit | Attending: Radiation Oncology | Admitting: Radiation Oncology

## 2017-03-03 ENCOUNTER — Ambulatory Visit: Payer: Medicare HMO

## 2017-03-03 DIAGNOSIS — I129 Hypertensive chronic kidney disease with stage 1 through stage 4 chronic kidney disease, or unspecified chronic kidney disease: Secondary | ICD-10-CM | POA: Diagnosis not present

## 2017-03-03 DIAGNOSIS — C8338 Diffuse large B-cell lymphoma, lymph nodes of multiple sites: Secondary | ICD-10-CM | POA: Diagnosis not present

## 2017-03-03 DIAGNOSIS — E119 Type 2 diabetes mellitus without complications: Secondary | ICD-10-CM | POA: Diagnosis not present

## 2017-03-03 DIAGNOSIS — N183 Chronic kidney disease, stage 3 (moderate): Secondary | ICD-10-CM | POA: Diagnosis not present

## 2017-03-03 DIAGNOSIS — R5383 Other fatigue: Secondary | ICD-10-CM | POA: Diagnosis not present

## 2017-03-03 DIAGNOSIS — N393 Stress incontinence (female) (male): Secondary | ICD-10-CM | POA: Diagnosis not present

## 2017-03-03 DIAGNOSIS — M129 Arthropathy, unspecified: Secondary | ICD-10-CM | POA: Diagnosis not present

## 2017-03-03 DIAGNOSIS — Z510 Encounter for antineoplastic radiation therapy: Secondary | ICD-10-CM | POA: Diagnosis not present

## 2017-03-03 DIAGNOSIS — C61 Malignant neoplasm of prostate: Secondary | ICD-10-CM | POA: Diagnosis not present

## 2017-03-06 ENCOUNTER — Ambulatory Visit
Admission: RE | Admit: 2017-03-06 | Discharge: 2017-03-06 | Disposition: A | Discharge status: Home / Self Care | Payer: Medicare HMO | Source: Ambulatory Visit | Attending: Radiation Oncology | Admitting: Radiation Oncology

## 2017-03-06 DIAGNOSIS — C8338 Diffuse large B-cell lymphoma, lymph nodes of multiple sites: Secondary | ICD-10-CM | POA: Diagnosis not present

## 2017-03-06 DIAGNOSIS — E119 Type 2 diabetes mellitus without complications: Secondary | ICD-10-CM | POA: Diagnosis not present

## 2017-03-06 DIAGNOSIS — C61 Malignant neoplasm of prostate: Secondary | ICD-10-CM | POA: Diagnosis not present

## 2017-03-06 DIAGNOSIS — N393 Stress incontinence (female) (male): Secondary | ICD-10-CM | POA: Diagnosis not present

## 2017-03-06 DIAGNOSIS — M129 Arthropathy, unspecified: Secondary | ICD-10-CM | POA: Diagnosis not present

## 2017-03-06 DIAGNOSIS — N183 Chronic kidney disease, stage 3 (moderate): Secondary | ICD-10-CM | POA: Diagnosis not present

## 2017-03-06 DIAGNOSIS — R5383 Other fatigue: Secondary | ICD-10-CM | POA: Diagnosis not present

## 2017-03-06 DIAGNOSIS — I129 Hypertensive chronic kidney disease with stage 1 through stage 4 chronic kidney disease, or unspecified chronic kidney disease: Secondary | ICD-10-CM | POA: Diagnosis not present

## 2017-03-06 DIAGNOSIS — Z510 Encounter for antineoplastic radiation therapy: Secondary | ICD-10-CM | POA: Diagnosis not present

## 2017-03-07 ENCOUNTER — Telehealth: Payer: Self-pay | Admitting: Urology

## 2017-03-07 NOTE — Telephone Encounter (Signed)
Kim from the cancer center called and asked if we wanted to follow this patient for his lupron injections? They will follow him for his cancer treatments but didn't know if you wanted them to do the Lupron or if you wanted to do it?  Please advise  Sharyn Lull

## 2017-03-08 NOTE — Telephone Encounter (Signed)
It is not clear to me that he necessarily needs ADT.  The data here is somewhat controversial.  His PSA is still quite low and salvage radiation alone may suffice.  I have not had this conversation with the patient.  I would defer this to Dr. Baruch Gouty to discretion in his experience.  Hollice Espy, MD

## 2017-03-09 NOTE — Telephone Encounter (Signed)
Spoke with Christian Kelly she will get with Dr. Annamary Rummage

## 2017-03-17 ENCOUNTER — Encounter: Payer: Self-pay | Admitting: *Deleted

## 2017-03-27 ENCOUNTER — Other Ambulatory Visit: Payer: Self-pay | Admitting: *Deleted

## 2017-03-27 DIAGNOSIS — C61 Malignant neoplasm of prostate: Secondary | ICD-10-CM

## 2017-04-03 ENCOUNTER — Other Ambulatory Visit: Payer: Self-pay | Admitting: Oncology

## 2017-04-04 ENCOUNTER — Encounter: Payer: Self-pay | Admitting: Radiation Oncology

## 2017-04-04 ENCOUNTER — Other Ambulatory Visit: Payer: Self-pay

## 2017-04-04 ENCOUNTER — Inpatient Hospital Stay: Payer: Medicare HMO

## 2017-04-04 ENCOUNTER — Inpatient Hospital Stay: Payer: Medicare HMO | Attending: Oncology

## 2017-04-04 ENCOUNTER — Ambulatory Visit
Admission: RE | Admit: 2017-04-04 | Discharge: 2017-04-04 | Disposition: A | Discharge status: Home / Self Care | Payer: Medicare HMO | Source: Ambulatory Visit | Attending: Radiation Oncology | Admitting: Radiation Oncology

## 2017-04-04 VITALS — BP 130/79 | HR 78 | Temp 96.4°F | Resp 18 | Wt 190.5 lb

## 2017-04-04 DIAGNOSIS — Z923 Personal history of irradiation: Secondary | ICD-10-CM | POA: Insufficient documentation

## 2017-04-04 DIAGNOSIS — Z510 Encounter for antineoplastic radiation therapy: Secondary | ICD-10-CM | POA: Diagnosis not present

## 2017-04-04 DIAGNOSIS — C61 Malignant neoplasm of prostate: Secondary | ICD-10-CM | POA: Diagnosis not present

## 2017-04-04 DIAGNOSIS — C8333 Diffuse large B-cell lymphoma, intra-abdominal lymph nodes: Secondary | ICD-10-CM

## 2017-04-04 DIAGNOSIS — Z95828 Presence of other vascular implants and grafts: Secondary | ICD-10-CM

## 2017-04-04 DIAGNOSIS — Z5111 Encounter for antineoplastic chemotherapy: Secondary | ICD-10-CM | POA: Diagnosis not present

## 2017-04-04 MED ORDER — SODIUM CHLORIDE 0.9% FLUSH
10.0000 mL | INTRAVENOUS | Status: AC | PRN
  Administered 2017-04-04: 10 mL
  Filled 2017-04-04: qty 10

## 2017-04-04 MED ORDER — HEPARIN SOD (PORK) LOCK FLUSH 100 UNIT/ML IV SOLN
500.0000 [IU] | INTRAVENOUS | Status: AC | PRN
  Administered 2017-04-04: 500 [IU]

## 2017-04-04 NOTE — Progress Notes (Signed)
Radiation Oncology Follow up Note old patient new area  Name: Christian Kelly   Date:   04/04/2017 MRN:  [id number] DOB: 03-30-1940    This 77 y.o. male presents to the clinic today for one-month follow-up status post involved field radiation therapy for diffuse large B-cell lymphoma of the abdomen status post R CHOP chemotherapy in patient with known biochemical failure status post radical prostatectomy.  REFERRING PROVIDER: Ezequiel Kayser, MD  HPI: Patient is an interesting 77 year old male who recently completed involved field radiation therapy after R CHOP chemotherapy for abdominal presentation of diffuse large B-cell lymphoma.Marland Kitchen He originally been seen in May 2018 when he had a slowly rising PSA status post prostatectomy 1999. Workup revealed his abdominal lymphoma and subsequent treatment ensued. He is completed his radiation therapy to his abdomen now has had very little side effects or complaints abdomen is benign P when take is good. He is seen today for consideration of radiation therapy for salvage. His last PSA had jumped from 0.6 in May 2 recently 0.9.  COMPLICATIONS OF TREATMENT: none  FOLLOW UP COMPLIANCE: keeps appointments   PHYSICAL EXAM:  BP 130/79   Pulse 78   Temp (!) 96.4 F (35.8 C)   Resp 18   Wt 190 lb 7.6 oz (86.4 kg)   BMI 25.83 kg/m  Well-developed well-nourished patient in NAD. HEENT reveals PERLA, EOMI, discs not visualized.  Oral cavity is clear. No oral mucosal lesions are identified. Neck is clear without evidence of cervical or supraclavicular adenopathy. Lungs are clear to A&P. Cardiac examination is essentially unremarkable with regular rate and rhythm without murmur rub or thrill. Abdomen is benign with no organomegaly or masses noted. Motor sensory and DTR levels are equal and symmetric in the upper and lower extremities. Cranial nerves II through XII are grossly intact. Proprioception is intact. No peripheral adenopathy or edema is identified. No motor or  sensory levels are noted. Crude visual fields are within normal range.  RADIOLOGY RESULTS: No current films for review  PLAN: At this time I to go ahead with treatment planning. Would plan on delivering 7600 cGy to his prostatic fossa treating his pelvic lymph nodes using I am RT dose painting technique. I've also like the patient to have a short course of a DVT therapy which has recently been shown in the [place] trial to improve time to progression in biochemical recurrence of prostate cancer in patient status post radical prostatectomy. This abated has been approved and he will have this today we ordered CT simulation and treatment planning. Risks and benefits of treatment including increased lower urinary tract symptoms diarrhea fatigue alteration of blood counts skin reaction all were discussed in detail with the patient. Him and his wife both seem to comprehend my treatment plan well. I have personally ordered and scheduled CT simulation for next week.  I would like to take this opportunity to thank you for allowing me to participate in the care of your patient.Noreene Filbert, MD

## 2017-04-10 ENCOUNTER — Ambulatory Visit: Payer: Medicare HMO | Admitting: Radiation Oncology

## 2017-04-11 DIAGNOSIS — H401130 Primary open-angle glaucoma, bilateral, stage unspecified: Secondary | ICD-10-CM | POA: Diagnosis not present

## 2017-04-11 DIAGNOSIS — H40019 Open angle with borderline findings, low risk, unspecified eye: Secondary | ICD-10-CM | POA: Diagnosis not present

## 2017-04-11 DIAGNOSIS — H35342 Macular cyst, hole, or pseudohole, left eye: Secondary | ICD-10-CM | POA: Diagnosis not present

## 2017-04-12 ENCOUNTER — Ambulatory Visit
Admission: RE | Admit: 2017-04-12 | Discharge: 2017-04-12 | Disposition: A | Discharge status: Home / Self Care | Payer: Medicare HMO | Source: Ambulatory Visit | Attending: Radiation Oncology | Admitting: Radiation Oncology

## 2017-04-12 ENCOUNTER — Inpatient Hospital Stay: Payer: Medicare HMO

## 2017-04-12 DIAGNOSIS — Z923 Personal history of irradiation: Secondary | ICD-10-CM | POA: Diagnosis not present

## 2017-04-12 DIAGNOSIS — C61 Malignant neoplasm of prostate: Secondary | ICD-10-CM | POA: Diagnosis not present

## 2017-04-12 DIAGNOSIS — C8338 Diffuse large B-cell lymphoma, lymph nodes of multiple sites: Secondary | ICD-10-CM | POA: Diagnosis not present

## 2017-04-12 DIAGNOSIS — C8333 Diffuse large B-cell lymphoma, intra-abdominal lymph nodes: Secondary | ICD-10-CM | POA: Diagnosis not present

## 2017-04-12 DIAGNOSIS — Z510 Encounter for antineoplastic radiation therapy: Secondary | ICD-10-CM | POA: Diagnosis not present

## 2017-04-12 DIAGNOSIS — Z5111 Encounter for antineoplastic chemotherapy: Secondary | ICD-10-CM | POA: Diagnosis not present

## 2017-04-12 MED ORDER — LEUPROLIDE ACETATE (4 MONTH) 30 MG IM KIT
30.0000 mg | PACK | Freq: Once | INTRAMUSCULAR | Status: AC
  Administered 2017-04-12: 30 mg via INTRAMUSCULAR
  Filled 2017-04-12: qty 30

## 2017-04-17 DIAGNOSIS — Z510 Encounter for antineoplastic radiation therapy: Secondary | ICD-10-CM | POA: Diagnosis not present

## 2017-04-17 DIAGNOSIS — C8333 Diffuse large B-cell lymphoma, intra-abdominal lymph nodes: Secondary | ICD-10-CM | POA: Diagnosis not present

## 2017-04-17 DIAGNOSIS — C61 Malignant neoplasm of prostate: Secondary | ICD-10-CM | POA: Diagnosis not present

## 2017-04-17 DIAGNOSIS — Z923 Personal history of irradiation: Secondary | ICD-10-CM | POA: Diagnosis not present

## 2017-04-17 DIAGNOSIS — C8338 Diffuse large B-cell lymphoma, lymph nodes of multiple sites: Secondary | ICD-10-CM | POA: Diagnosis not present

## 2017-04-21 ENCOUNTER — Other Ambulatory Visit: Payer: Self-pay | Admitting: *Deleted

## 2017-04-21 DIAGNOSIS — C61 Malignant neoplasm of prostate: Secondary | ICD-10-CM

## 2017-04-24 ENCOUNTER — Ambulatory Visit
Admission: RE | Admit: 2017-04-24 | Discharge: 2017-04-24 | Disposition: A | Discharge status: Home / Self Care | Payer: Medicare HMO | Source: Ambulatory Visit | Attending: Radiation Oncology | Admitting: Radiation Oncology

## 2017-04-24 DIAGNOSIS — C61 Malignant neoplasm of prostate: Secondary | ICD-10-CM | POA: Insufficient documentation

## 2017-04-24 DIAGNOSIS — Z510 Encounter for antineoplastic radiation therapy: Secondary | ICD-10-CM | POA: Insufficient documentation

## 2017-04-24 DIAGNOSIS — Z9079 Acquired absence of other genital organ(s): Secondary | ICD-10-CM | POA: Insufficient documentation

## 2017-04-24 DIAGNOSIS — C8330 Diffuse large B-cell lymphoma, unspecified site: Secondary | ICD-10-CM | POA: Insufficient documentation

## 2017-04-24 DIAGNOSIS — R9721 Rising PSA following treatment for malignant neoplasm of prostate: Secondary | ICD-10-CM | POA: Insufficient documentation

## 2017-04-25 ENCOUNTER — Ambulatory Visit
Admission: RE | Admit: 2017-04-25 | Discharge: 2017-04-25 | Disposition: A | Discharge status: Home / Self Care | Payer: Medicare HMO | Source: Ambulatory Visit | Attending: Radiation Oncology | Admitting: Radiation Oncology

## 2017-04-25 DIAGNOSIS — C61 Malignant neoplasm of prostate: Secondary | ICD-10-CM | POA: Diagnosis not present

## 2017-04-25 DIAGNOSIS — Z510 Encounter for antineoplastic radiation therapy: Secondary | ICD-10-CM | POA: Diagnosis not present

## 2017-04-25 DIAGNOSIS — Z9079 Acquired absence of other genital organ(s): Secondary | ICD-10-CM | POA: Diagnosis not present

## 2017-04-25 DIAGNOSIS — C8330 Diffuse large B-cell lymphoma, unspecified site: Secondary | ICD-10-CM | POA: Diagnosis not present

## 2017-04-25 DIAGNOSIS — R9721 Rising PSA following treatment for malignant neoplasm of prostate: Secondary | ICD-10-CM | POA: Diagnosis not present

## 2017-04-25 DIAGNOSIS — C8338 Diffuse large B-cell lymphoma, lymph nodes of multiple sites: Secondary | ICD-10-CM | POA: Diagnosis not present

## 2017-04-26 ENCOUNTER — Ambulatory Visit
Admission: RE | Admit: 2017-04-26 | Discharge: 2017-04-26 | Disposition: A | Discharge status: Home / Self Care | Payer: Medicare HMO | Source: Ambulatory Visit | Attending: Radiation Oncology | Admitting: Radiation Oncology

## 2017-04-26 DIAGNOSIS — C61 Malignant neoplasm of prostate: Secondary | ICD-10-CM | POA: Diagnosis not present

## 2017-04-26 DIAGNOSIS — C8338 Diffuse large B-cell lymphoma, lymph nodes of multiple sites: Secondary | ICD-10-CM | POA: Diagnosis not present

## 2017-04-26 DIAGNOSIS — R9721 Rising PSA following treatment for malignant neoplasm of prostate: Secondary | ICD-10-CM | POA: Diagnosis not present

## 2017-04-26 DIAGNOSIS — Z510 Encounter for antineoplastic radiation therapy: Secondary | ICD-10-CM | POA: Diagnosis not present

## 2017-04-26 DIAGNOSIS — C8330 Diffuse large B-cell lymphoma, unspecified site: Secondary | ICD-10-CM | POA: Diagnosis not present

## 2017-04-26 DIAGNOSIS — Z9079 Acquired absence of other genital organ(s): Secondary | ICD-10-CM | POA: Diagnosis not present

## 2017-04-27 ENCOUNTER — Ambulatory Visit
Admission: RE | Admit: 2017-04-27 | Discharge: 2017-04-27 | Disposition: A | Discharge status: Home / Self Care | Payer: Medicare HMO | Source: Ambulatory Visit | Attending: Radiation Oncology | Admitting: Radiation Oncology

## 2017-04-27 DIAGNOSIS — R9721 Rising PSA following treatment for malignant neoplasm of prostate: Secondary | ICD-10-CM | POA: Diagnosis not present

## 2017-04-27 DIAGNOSIS — C8330 Diffuse large B-cell lymphoma, unspecified site: Secondary | ICD-10-CM | POA: Diagnosis not present

## 2017-04-27 DIAGNOSIS — C8338 Diffuse large B-cell lymphoma, lymph nodes of multiple sites: Secondary | ICD-10-CM | POA: Diagnosis not present

## 2017-04-27 DIAGNOSIS — C61 Malignant neoplasm of prostate: Secondary | ICD-10-CM | POA: Diagnosis not present

## 2017-04-27 DIAGNOSIS — Z510 Encounter for antineoplastic radiation therapy: Secondary | ICD-10-CM | POA: Diagnosis not present

## 2017-04-27 DIAGNOSIS — Z9079 Acquired absence of other genital organ(s): Secondary | ICD-10-CM | POA: Diagnosis not present

## 2017-04-28 ENCOUNTER — Ambulatory Visit
Admission: RE | Admit: 2017-04-28 | Discharge: 2017-04-28 | Disposition: A | Discharge status: Home / Self Care | Payer: Medicare HMO | Source: Ambulatory Visit | Attending: Radiation Oncology | Admitting: Radiation Oncology

## 2017-04-28 DIAGNOSIS — C61 Malignant neoplasm of prostate: Secondary | ICD-10-CM | POA: Diagnosis not present

## 2017-04-28 DIAGNOSIS — C8330 Diffuse large B-cell lymphoma, unspecified site: Secondary | ICD-10-CM | POA: Diagnosis not present

## 2017-04-28 DIAGNOSIS — C8338 Diffuse large B-cell lymphoma, lymph nodes of multiple sites: Secondary | ICD-10-CM | POA: Diagnosis not present

## 2017-04-28 DIAGNOSIS — R9721 Rising PSA following treatment for malignant neoplasm of prostate: Secondary | ICD-10-CM | POA: Diagnosis not present

## 2017-04-28 DIAGNOSIS — Z510 Encounter for antineoplastic radiation therapy: Secondary | ICD-10-CM | POA: Diagnosis not present

## 2017-04-28 DIAGNOSIS — Z9079 Acquired absence of other genital organ(s): Secondary | ICD-10-CM | POA: Diagnosis not present

## 2017-05-01 ENCOUNTER — Ambulatory Visit
Admission: RE | Admit: 2017-05-01 | Discharge: 2017-05-01 | Disposition: A | Discharge status: Home / Self Care | Payer: Medicare HMO | Source: Ambulatory Visit | Attending: Radiation Oncology | Admitting: Radiation Oncology

## 2017-05-01 DIAGNOSIS — Z9079 Acquired absence of other genital organ(s): Secondary | ICD-10-CM | POA: Diagnosis not present

## 2017-05-01 DIAGNOSIS — C61 Malignant neoplasm of prostate: Secondary | ICD-10-CM | POA: Diagnosis not present

## 2017-05-01 DIAGNOSIS — Z510 Encounter for antineoplastic radiation therapy: Secondary | ICD-10-CM | POA: Diagnosis not present

## 2017-05-01 DIAGNOSIS — C8338 Diffuse large B-cell lymphoma, lymph nodes of multiple sites: Secondary | ICD-10-CM | POA: Diagnosis not present

## 2017-05-01 DIAGNOSIS — R9721 Rising PSA following treatment for malignant neoplasm of prostate: Secondary | ICD-10-CM | POA: Diagnosis not present

## 2017-05-01 DIAGNOSIS — C8330 Diffuse large B-cell lymphoma, unspecified site: Secondary | ICD-10-CM | POA: Diagnosis not present

## 2017-05-02 ENCOUNTER — Ambulatory Visit
Admission: RE | Admit: 2017-05-02 | Discharge: 2017-05-02 | Disposition: A | Discharge status: Home / Self Care | Payer: Medicare HMO | Source: Ambulatory Visit | Attending: Radiation Oncology | Admitting: Radiation Oncology

## 2017-05-02 DIAGNOSIS — Z510 Encounter for antineoplastic radiation therapy: Secondary | ICD-10-CM | POA: Diagnosis not present

## 2017-05-02 DIAGNOSIS — C61 Malignant neoplasm of prostate: Secondary | ICD-10-CM | POA: Diagnosis not present

## 2017-05-02 DIAGNOSIS — C8330 Diffuse large B-cell lymphoma, unspecified site: Secondary | ICD-10-CM | POA: Diagnosis not present

## 2017-05-02 DIAGNOSIS — C8338 Diffuse large B-cell lymphoma, lymph nodes of multiple sites: Secondary | ICD-10-CM | POA: Diagnosis not present

## 2017-05-02 DIAGNOSIS — R9721 Rising PSA following treatment for malignant neoplasm of prostate: Secondary | ICD-10-CM | POA: Diagnosis not present

## 2017-05-02 DIAGNOSIS — Z9079 Acquired absence of other genital organ(s): Secondary | ICD-10-CM | POA: Diagnosis not present

## 2017-05-03 ENCOUNTER — Ambulatory Visit
Admission: RE | Admit: 2017-05-03 | Discharge: 2017-05-03 | Disposition: A | Discharge status: Home / Self Care | Payer: Medicare HMO | Source: Ambulatory Visit | Attending: Radiation Oncology | Admitting: Radiation Oncology

## 2017-05-03 DIAGNOSIS — C8338 Diffuse large B-cell lymphoma, lymph nodes of multiple sites: Secondary | ICD-10-CM | POA: Diagnosis not present

## 2017-05-03 DIAGNOSIS — Z9079 Acquired absence of other genital organ(s): Secondary | ICD-10-CM | POA: Diagnosis not present

## 2017-05-03 DIAGNOSIS — C61 Malignant neoplasm of prostate: Secondary | ICD-10-CM | POA: Diagnosis not present

## 2017-05-03 DIAGNOSIS — Z510 Encounter for antineoplastic radiation therapy: Secondary | ICD-10-CM | POA: Diagnosis not present

## 2017-05-03 DIAGNOSIS — C8330 Diffuse large B-cell lymphoma, unspecified site: Secondary | ICD-10-CM | POA: Diagnosis not present

## 2017-05-03 DIAGNOSIS — R9721 Rising PSA following treatment for malignant neoplasm of prostate: Secondary | ICD-10-CM | POA: Diagnosis not present

## 2017-05-04 ENCOUNTER — Ambulatory Visit
Admission: RE | Admit: 2017-05-04 | Discharge: 2017-05-04 | Disposition: A | Discharge status: Home / Self Care | Payer: Medicare HMO | Source: Ambulatory Visit | Attending: Radiation Oncology | Admitting: Radiation Oncology

## 2017-05-04 DIAGNOSIS — R9721 Rising PSA following treatment for malignant neoplasm of prostate: Secondary | ICD-10-CM | POA: Diagnosis not present

## 2017-05-04 DIAGNOSIS — Z9079 Acquired absence of other genital organ(s): Secondary | ICD-10-CM | POA: Diagnosis not present

## 2017-05-04 DIAGNOSIS — C8330 Diffuse large B-cell lymphoma, unspecified site: Secondary | ICD-10-CM | POA: Diagnosis not present

## 2017-05-04 DIAGNOSIS — Z510 Encounter for antineoplastic radiation therapy: Secondary | ICD-10-CM | POA: Diagnosis not present

## 2017-05-04 DIAGNOSIS — C8338 Diffuse large B-cell lymphoma, lymph nodes of multiple sites: Secondary | ICD-10-CM | POA: Diagnosis not present

## 2017-05-04 DIAGNOSIS — C61 Malignant neoplasm of prostate: Secondary | ICD-10-CM | POA: Diagnosis not present

## 2017-05-05 ENCOUNTER — Ambulatory Visit
Admission: RE | Admit: 2017-05-05 | Discharge: 2017-05-05 | Disposition: A | Discharge status: Home / Self Care | Payer: Medicare HMO | Source: Ambulatory Visit | Attending: Radiation Oncology | Admitting: Radiation Oncology

## 2017-05-05 DIAGNOSIS — C8338 Diffuse large B-cell lymphoma, lymph nodes of multiple sites: Secondary | ICD-10-CM | POA: Diagnosis not present

## 2017-05-05 DIAGNOSIS — Z510 Encounter for antineoplastic radiation therapy: Secondary | ICD-10-CM | POA: Diagnosis not present

## 2017-05-05 DIAGNOSIS — R9721 Rising PSA following treatment for malignant neoplasm of prostate: Secondary | ICD-10-CM | POA: Diagnosis not present

## 2017-05-05 DIAGNOSIS — C61 Malignant neoplasm of prostate: Secondary | ICD-10-CM | POA: Diagnosis not present

## 2017-05-05 DIAGNOSIS — C8330 Diffuse large B-cell lymphoma, unspecified site: Secondary | ICD-10-CM | POA: Diagnosis not present

## 2017-05-05 DIAGNOSIS — Z9079 Acquired absence of other genital organ(s): Secondary | ICD-10-CM | POA: Diagnosis not present

## 2017-05-08 ENCOUNTER — Ambulatory Visit
Admission: RE | Admit: 2017-05-08 | Discharge: 2017-05-08 | Disposition: A | Discharge status: Home / Self Care | Payer: Medicare HMO | Source: Ambulatory Visit | Attending: Radiation Oncology | Admitting: Radiation Oncology

## 2017-05-08 DIAGNOSIS — Z510 Encounter for antineoplastic radiation therapy: Secondary | ICD-10-CM | POA: Diagnosis not present

## 2017-05-08 DIAGNOSIS — C8338 Diffuse large B-cell lymphoma, lymph nodes of multiple sites: Secondary | ICD-10-CM | POA: Diagnosis not present

## 2017-05-08 DIAGNOSIS — C61 Malignant neoplasm of prostate: Secondary | ICD-10-CM | POA: Diagnosis not present

## 2017-05-08 DIAGNOSIS — Z9079 Acquired absence of other genital organ(s): Secondary | ICD-10-CM | POA: Diagnosis not present

## 2017-05-08 DIAGNOSIS — R9721 Rising PSA following treatment for malignant neoplasm of prostate: Secondary | ICD-10-CM | POA: Diagnosis not present

## 2017-05-08 DIAGNOSIS — C8330 Diffuse large B-cell lymphoma, unspecified site: Secondary | ICD-10-CM | POA: Diagnosis not present

## 2017-05-09 ENCOUNTER — Ambulatory Visit
Admission: RE | Admit: 2017-05-09 | Discharge: 2017-05-09 | Disposition: A | Discharge status: Home / Self Care | Payer: Medicare HMO | Source: Ambulatory Visit | Attending: Radiation Oncology | Admitting: Radiation Oncology

## 2017-05-09 DIAGNOSIS — C8330 Diffuse large B-cell lymphoma, unspecified site: Secondary | ICD-10-CM | POA: Diagnosis not present

## 2017-05-09 DIAGNOSIS — Z510 Encounter for antineoplastic radiation therapy: Secondary | ICD-10-CM | POA: Diagnosis not present

## 2017-05-09 DIAGNOSIS — R9721 Rising PSA following treatment for malignant neoplasm of prostate: Secondary | ICD-10-CM | POA: Diagnosis not present

## 2017-05-09 DIAGNOSIS — C8338 Diffuse large B-cell lymphoma, lymph nodes of multiple sites: Secondary | ICD-10-CM | POA: Diagnosis not present

## 2017-05-09 DIAGNOSIS — C8333 Diffuse large B-cell lymphoma, intra-abdominal lymph nodes: Secondary | ICD-10-CM | POA: Diagnosis not present

## 2017-05-09 DIAGNOSIS — C61 Malignant neoplasm of prostate: Secondary | ICD-10-CM | POA: Diagnosis not present

## 2017-05-09 DIAGNOSIS — Z9079 Acquired absence of other genital organ(s): Secondary | ICD-10-CM | POA: Diagnosis not present

## 2017-05-10 ENCOUNTER — Inpatient Hospital Stay: Payer: Medicare HMO | Attending: Radiation Oncology

## 2017-05-10 ENCOUNTER — Ambulatory Visit
Admission: RE | Admit: 2017-05-10 | Discharge: 2017-05-10 | Disposition: A | Discharge status: Home / Self Care | Payer: Medicare HMO | Source: Ambulatory Visit | Attending: Radiation Oncology | Admitting: Radiation Oncology

## 2017-05-10 DIAGNOSIS — R9721 Rising PSA following treatment for malignant neoplasm of prostate: Secondary | ICD-10-CM | POA: Diagnosis not present

## 2017-05-10 DIAGNOSIS — Z9079 Acquired absence of other genital organ(s): Secondary | ICD-10-CM | POA: Diagnosis not present

## 2017-05-10 DIAGNOSIS — C8333 Diffuse large B-cell lymphoma, intra-abdominal lymph nodes: Secondary | ICD-10-CM | POA: Diagnosis not present

## 2017-05-10 DIAGNOSIS — C61 Malignant neoplasm of prostate: Secondary | ICD-10-CM | POA: Insufficient documentation

## 2017-05-10 DIAGNOSIS — C8338 Diffuse large B-cell lymphoma, lymph nodes of multiple sites: Secondary | ICD-10-CM | POA: Diagnosis not present

## 2017-05-10 DIAGNOSIS — Z510 Encounter for antineoplastic radiation therapy: Secondary | ICD-10-CM | POA: Diagnosis not present

## 2017-05-10 DIAGNOSIS — C8330 Diffuse large B-cell lymphoma, unspecified site: Secondary | ICD-10-CM | POA: Diagnosis not present

## 2017-05-10 LAB — CBC
HEMATOCRIT: 40.2 % (ref 40.0–52.0)
Hemoglobin: 14.2 g/dL (ref 13.0–18.0)
MCH: 31.6 pg (ref 26.0–34.0)
MCHC: 35.3 g/dL (ref 32.0–36.0)
MCV: 89.6 fL (ref 80.0–100.0)
Platelets: 153 10*3/uL (ref 150–440)
RBC: 4.48 MIL/uL (ref 4.40–5.90)
RDW: 12.9 % (ref 11.5–14.5)
WBC: 4.3 10*3/uL (ref 3.8–10.6)

## 2017-05-11 ENCOUNTER — Ambulatory Visit
Admission: RE | Admit: 2017-05-11 | Discharge: 2017-05-11 | Disposition: A | Discharge status: Home / Self Care | Payer: Medicare HMO | Source: Ambulatory Visit | Attending: Radiation Oncology | Admitting: Radiation Oncology

## 2017-05-11 DIAGNOSIS — Z510 Encounter for antineoplastic radiation therapy: Secondary | ICD-10-CM | POA: Diagnosis not present

## 2017-05-11 DIAGNOSIS — C8330 Diffuse large B-cell lymphoma, unspecified site: Secondary | ICD-10-CM | POA: Diagnosis not present

## 2017-05-11 DIAGNOSIS — Z9079 Acquired absence of other genital organ(s): Secondary | ICD-10-CM | POA: Diagnosis not present

## 2017-05-11 DIAGNOSIS — R9721 Rising PSA following treatment for malignant neoplasm of prostate: Secondary | ICD-10-CM | POA: Diagnosis not present

## 2017-05-11 DIAGNOSIS — C8338 Diffuse large B-cell lymphoma, lymph nodes of multiple sites: Secondary | ICD-10-CM | POA: Diagnosis not present

## 2017-05-11 DIAGNOSIS — C61 Malignant neoplasm of prostate: Secondary | ICD-10-CM | POA: Diagnosis not present

## 2017-05-12 ENCOUNTER — Ambulatory Visit
Admission: RE | Admit: 2017-05-12 | Discharge: 2017-05-12 | Disposition: A | Discharge status: Home / Self Care | Payer: Medicare HMO | Source: Ambulatory Visit | Attending: Radiation Oncology | Admitting: Radiation Oncology

## 2017-05-12 DIAGNOSIS — C8338 Diffuse large B-cell lymphoma, lymph nodes of multiple sites: Secondary | ICD-10-CM | POA: Diagnosis not present

## 2017-05-12 DIAGNOSIS — C8330 Diffuse large B-cell lymphoma, unspecified site: Secondary | ICD-10-CM | POA: Diagnosis not present

## 2017-05-12 DIAGNOSIS — R9721 Rising PSA following treatment for malignant neoplasm of prostate: Secondary | ICD-10-CM | POA: Diagnosis not present

## 2017-05-12 DIAGNOSIS — Z510 Encounter for antineoplastic radiation therapy: Secondary | ICD-10-CM | POA: Diagnosis not present

## 2017-05-12 DIAGNOSIS — Z9079 Acquired absence of other genital organ(s): Secondary | ICD-10-CM | POA: Diagnosis not present

## 2017-05-12 DIAGNOSIS — C61 Malignant neoplasm of prostate: Secondary | ICD-10-CM | POA: Diagnosis not present

## 2017-05-15 ENCOUNTER — Ambulatory Visit
Admission: RE | Admit: 2017-05-15 | Discharge: 2017-05-15 | Disposition: A | Discharge status: Home / Self Care | Payer: Medicare HMO | Source: Ambulatory Visit | Attending: Radiation Oncology | Admitting: Radiation Oncology

## 2017-05-15 DIAGNOSIS — R9721 Rising PSA following treatment for malignant neoplasm of prostate: Secondary | ICD-10-CM | POA: Diagnosis not present

## 2017-05-15 DIAGNOSIS — Z510 Encounter for antineoplastic radiation therapy: Secondary | ICD-10-CM | POA: Diagnosis not present

## 2017-05-15 DIAGNOSIS — Z9079 Acquired absence of other genital organ(s): Secondary | ICD-10-CM | POA: Diagnosis not present

## 2017-05-15 DIAGNOSIS — C8338 Diffuse large B-cell lymphoma, lymph nodes of multiple sites: Secondary | ICD-10-CM | POA: Diagnosis not present

## 2017-05-15 DIAGNOSIS — C8330 Diffuse large B-cell lymphoma, unspecified site: Secondary | ICD-10-CM | POA: Diagnosis not present

## 2017-05-15 DIAGNOSIS — C61 Malignant neoplasm of prostate: Secondary | ICD-10-CM | POA: Diagnosis not present

## 2017-05-16 ENCOUNTER — Ambulatory Visit
Admission: RE | Admit: 2017-05-16 | Discharge: 2017-05-16 | Disposition: A | Discharge status: Home / Self Care | Payer: Medicare HMO | Source: Ambulatory Visit | Attending: Radiation Oncology | Admitting: Radiation Oncology

## 2017-05-16 DIAGNOSIS — Z9079 Acquired absence of other genital organ(s): Secondary | ICD-10-CM | POA: Diagnosis not present

## 2017-05-16 DIAGNOSIS — C8330 Diffuse large B-cell lymphoma, unspecified site: Secondary | ICD-10-CM | POA: Diagnosis not present

## 2017-05-16 DIAGNOSIS — Z510 Encounter for antineoplastic radiation therapy: Secondary | ICD-10-CM | POA: Diagnosis not present

## 2017-05-16 DIAGNOSIS — C8338 Diffuse large B-cell lymphoma, lymph nodes of multiple sites: Secondary | ICD-10-CM | POA: Diagnosis not present

## 2017-05-16 DIAGNOSIS — C61 Malignant neoplasm of prostate: Secondary | ICD-10-CM | POA: Diagnosis not present

## 2017-05-16 DIAGNOSIS — R9721 Rising PSA following treatment for malignant neoplasm of prostate: Secondary | ICD-10-CM | POA: Diagnosis not present

## 2017-05-17 ENCOUNTER — Ambulatory Visit
Admission: RE | Admit: 2017-05-17 | Discharge: 2017-05-17 | Disposition: A | Discharge status: Home / Self Care | Payer: Medicare HMO | Source: Ambulatory Visit | Attending: Radiation Oncology | Admitting: Radiation Oncology

## 2017-05-17 DIAGNOSIS — C8330 Diffuse large B-cell lymphoma, unspecified site: Secondary | ICD-10-CM | POA: Diagnosis not present

## 2017-05-17 DIAGNOSIS — C8338 Diffuse large B-cell lymphoma, lymph nodes of multiple sites: Secondary | ICD-10-CM | POA: Diagnosis not present

## 2017-05-17 DIAGNOSIS — R9721 Rising PSA following treatment for malignant neoplasm of prostate: Secondary | ICD-10-CM | POA: Diagnosis not present

## 2017-05-17 DIAGNOSIS — C61 Malignant neoplasm of prostate: Secondary | ICD-10-CM | POA: Diagnosis not present

## 2017-05-17 DIAGNOSIS — Z9079 Acquired absence of other genital organ(s): Secondary | ICD-10-CM | POA: Diagnosis not present

## 2017-05-17 DIAGNOSIS — Z510 Encounter for antineoplastic radiation therapy: Secondary | ICD-10-CM | POA: Diagnosis not present

## 2017-05-18 ENCOUNTER — Ambulatory Visit
Admission: RE | Admit: 2017-05-18 | Discharge: 2017-05-18 | Disposition: A | Discharge status: Home / Self Care | Payer: Medicare HMO | Source: Ambulatory Visit | Attending: Radiation Oncology | Admitting: Radiation Oncology

## 2017-05-18 DIAGNOSIS — C8330 Diffuse large B-cell lymphoma, unspecified site: Secondary | ICD-10-CM | POA: Diagnosis not present

## 2017-05-18 DIAGNOSIS — Z510 Encounter for antineoplastic radiation therapy: Secondary | ICD-10-CM | POA: Diagnosis not present

## 2017-05-18 DIAGNOSIS — C8338 Diffuse large B-cell lymphoma, lymph nodes of multiple sites: Secondary | ICD-10-CM | POA: Diagnosis not present

## 2017-05-18 DIAGNOSIS — C61 Malignant neoplasm of prostate: Secondary | ICD-10-CM | POA: Diagnosis not present

## 2017-05-18 DIAGNOSIS — Z9079 Acquired absence of other genital organ(s): Secondary | ICD-10-CM | POA: Diagnosis not present

## 2017-05-18 DIAGNOSIS — R9721 Rising PSA following treatment for malignant neoplasm of prostate: Secondary | ICD-10-CM | POA: Diagnosis not present

## 2017-05-19 ENCOUNTER — Ambulatory Visit
Admission: RE | Admit: 2017-05-19 | Discharge: 2017-05-19 | Disposition: A | Discharge status: Home / Self Care | Payer: Medicare HMO | Source: Ambulatory Visit | Attending: Radiation Oncology | Admitting: Radiation Oncology

## 2017-05-19 DIAGNOSIS — R9721 Rising PSA following treatment for malignant neoplasm of prostate: Secondary | ICD-10-CM | POA: Diagnosis not present

## 2017-05-19 DIAGNOSIS — C8330 Diffuse large B-cell lymphoma, unspecified site: Secondary | ICD-10-CM | POA: Diagnosis not present

## 2017-05-19 DIAGNOSIS — C8338 Diffuse large B-cell lymphoma, lymph nodes of multiple sites: Secondary | ICD-10-CM | POA: Diagnosis not present

## 2017-05-19 DIAGNOSIS — Z9079 Acquired absence of other genital organ(s): Secondary | ICD-10-CM | POA: Diagnosis not present

## 2017-05-19 DIAGNOSIS — Z510 Encounter for antineoplastic radiation therapy: Secondary | ICD-10-CM | POA: Diagnosis not present

## 2017-05-19 DIAGNOSIS — C61 Malignant neoplasm of prostate: Secondary | ICD-10-CM | POA: Diagnosis not present

## 2017-05-22 ENCOUNTER — Ambulatory Visit
Admission: RE | Admit: 2017-05-22 | Discharge: 2017-05-22 | Disposition: A | Discharge status: Home / Self Care | Payer: Medicare HMO | Source: Ambulatory Visit | Attending: Radiation Oncology | Admitting: Radiation Oncology

## 2017-05-22 DIAGNOSIS — Z510 Encounter for antineoplastic radiation therapy: Secondary | ICD-10-CM | POA: Insufficient documentation

## 2017-05-22 DIAGNOSIS — C61 Malignant neoplasm of prostate: Secondary | ICD-10-CM | POA: Insufficient documentation

## 2017-05-22 DIAGNOSIS — C8338 Diffuse large B-cell lymphoma, lymph nodes of multiple sites: Secondary | ICD-10-CM | POA: Diagnosis not present

## 2017-05-23 ENCOUNTER — Ambulatory Visit
Admission: RE | Admit: 2017-05-23 | Discharge: 2017-05-23 | Disposition: A | Discharge status: Home / Self Care | Payer: Medicare HMO | Source: Ambulatory Visit | Attending: Oncology | Admitting: Oncology

## 2017-05-23 ENCOUNTER — Ambulatory Visit
Admission: RE | Admit: 2017-05-23 | Discharge: 2017-05-23 | Disposition: A | Discharge status: Home / Self Care | Payer: Medicare HMO | Source: Ambulatory Visit | Attending: Radiation Oncology | Admitting: Radiation Oncology

## 2017-05-23 DIAGNOSIS — C8333 Diffuse large B-cell lymphoma, intra-abdominal lymph nodes: Secondary | ICD-10-CM | POA: Diagnosis not present

## 2017-05-23 DIAGNOSIS — Z79899 Other long term (current) drug therapy: Secondary | ICD-10-CM | POA: Diagnosis not present

## 2017-05-23 DIAGNOSIS — C8338 Diffuse large B-cell lymphoma, lymph nodes of multiple sites: Secondary | ICD-10-CM | POA: Diagnosis not present

## 2017-05-23 DIAGNOSIS — C8330 Diffuse large B-cell lymphoma, unspecified site: Secondary | ICD-10-CM | POA: Diagnosis not present

## 2017-05-23 DIAGNOSIS — C61 Malignant neoplasm of prostate: Secondary | ICD-10-CM | POA: Diagnosis not present

## 2017-05-23 DIAGNOSIS — Z510 Encounter for antineoplastic radiation therapy: Secondary | ICD-10-CM | POA: Diagnosis not present

## 2017-05-23 LAB — GLUCOSE, CAPILLARY: GLUCOSE-CAPILLARY: 134 mg/dL — AB (ref 65–99)

## 2017-05-23 MED ORDER — FLUDEOXYGLUCOSE F - 18 (FDG) INJECTION
9.7300 | Freq: Once | INTRAVENOUS | Status: AC | PRN
  Administered 2017-05-23: 9.73 via INTRAVENOUS

## 2017-05-24 ENCOUNTER — Other Ambulatory Visit: Payer: Self-pay | Admitting: *Deleted

## 2017-05-24 ENCOUNTER — Inpatient Hospital Stay: Payer: Medicare HMO | Attending: Radiation Oncology

## 2017-05-24 ENCOUNTER — Ambulatory Visit
Admission: RE | Admit: 2017-05-24 | Discharge: 2017-05-24 | Disposition: A | Discharge status: Home / Self Care | Payer: Medicare HMO | Source: Ambulatory Visit | Attending: Radiation Oncology | Admitting: Radiation Oncology

## 2017-05-24 DIAGNOSIS — Z85038 Personal history of other malignant neoplasm of large intestine: Secondary | ICD-10-CM | POA: Insufficient documentation

## 2017-05-24 DIAGNOSIS — C8330 Diffuse large B-cell lymphoma, unspecified site: Secondary | ICD-10-CM | POA: Insufficient documentation

## 2017-05-24 DIAGNOSIS — C8338 Diffuse large B-cell lymphoma, lymph nodes of multiple sites: Secondary | ICD-10-CM | POA: Diagnosis not present

## 2017-05-24 DIAGNOSIS — C61 Malignant neoplasm of prostate: Secondary | ICD-10-CM

## 2017-05-24 DIAGNOSIS — C8333 Diffuse large B-cell lymphoma, intra-abdominal lymph nodes: Secondary | ICD-10-CM

## 2017-05-24 DIAGNOSIS — Z510 Encounter for antineoplastic radiation therapy: Secondary | ICD-10-CM | POA: Diagnosis not present

## 2017-05-24 LAB — CBC WITH DIFFERENTIAL/PLATELET
Basophils Absolute: 0 10*3/uL (ref 0–0.1)
Basophils Relative: 1 %
Eosinophils Absolute: 0.3 10*3/uL (ref 0–0.7)
Eosinophils Relative: 5 %
HCT: 38.4 % — ABNORMAL LOW (ref 40.0–52.0)
HEMOGLOBIN: 13.6 g/dL (ref 13.0–18.0)
LYMPHS ABS: 0.4 10*3/uL — AB (ref 1.0–3.6)
LYMPHS PCT: 8 %
MCH: 32.1 pg (ref 26.0–34.0)
MCHC: 35.5 g/dL (ref 32.0–36.0)
MCV: 90.5 fL (ref 80.0–100.0)
MONOS PCT: 13 %
Monocytes Absolute: 0.7 10*3/uL (ref 0.2–1.0)
NEUTROS PCT: 73 %
Neutro Abs: 3.8 10*3/uL (ref 1.4–6.5)
Platelets: 186 10*3/uL (ref 150–440)
RBC: 4.25 MIL/uL — AB (ref 4.40–5.90)
RDW: 13.2 % (ref 11.5–14.5)
WBC: 5.1 10*3/uL (ref 3.8–10.6)

## 2017-05-24 LAB — COMPREHENSIVE METABOLIC PANEL
ALT: 39 U/L (ref 17–63)
AST: 30 U/L (ref 15–41)
Albumin: 3.9 g/dL (ref 3.5–5.0)
Alkaline Phosphatase: 89 U/L (ref 38–126)
Anion gap: 8 (ref 5–15)
BUN: 24 mg/dL — ABNORMAL HIGH (ref 6–20)
CHLORIDE: 105 mmol/L (ref 101–111)
CO2: 27 mmol/L (ref 22–32)
CREATININE: 0.98 mg/dL (ref 0.61–1.24)
Calcium: 9.3 mg/dL (ref 8.9–10.3)
Glucose, Bld: 187 mg/dL — ABNORMAL HIGH (ref 65–99)
POTASSIUM: 4.1 mmol/L (ref 3.5–5.1)
SODIUM: 140 mmol/L (ref 135–145)
Total Bilirubin: 0.6 mg/dL (ref 0.3–1.2)
Total Protein: 6.6 g/dL (ref 6.5–8.1)

## 2017-05-24 LAB — LACTATE DEHYDROGENASE: LDH: 203 U/L — AB (ref 98–192)

## 2017-05-24 LAB — PSA: Prostatic Specific Antigen: 0.01 ng/mL (ref 0.00–4.00)

## 2017-05-25 ENCOUNTER — Inpatient Hospital Stay: Payer: Medicare HMO

## 2017-05-25 ENCOUNTER — Ambulatory Visit
Admission: RE | Admit: 2017-05-25 | Discharge: 2017-05-25 | Disposition: A | Discharge status: Home / Self Care | Payer: Medicare HMO | Source: Ambulatory Visit | Attending: Radiation Oncology | Admitting: Radiation Oncology

## 2017-05-25 DIAGNOSIS — C8338 Diffuse large B-cell lymphoma, lymph nodes of multiple sites: Secondary | ICD-10-CM | POA: Diagnosis not present

## 2017-05-25 DIAGNOSIS — C61 Malignant neoplasm of prostate: Secondary | ICD-10-CM | POA: Diagnosis not present

## 2017-05-25 DIAGNOSIS — Z510 Encounter for antineoplastic radiation therapy: Secondary | ICD-10-CM | POA: Diagnosis not present

## 2017-05-26 ENCOUNTER — Inpatient Hospital Stay: Payer: Medicare HMO

## 2017-05-26 ENCOUNTER — Inpatient Hospital Stay (HOSPITAL_BASED_OUTPATIENT_CLINIC_OR_DEPARTMENT_OTHER): Payer: Medicare HMO | Admitting: Oncology

## 2017-05-26 ENCOUNTER — Encounter: Payer: Self-pay | Admitting: Oncology

## 2017-05-26 ENCOUNTER — Ambulatory Visit
Admission: RE | Admit: 2017-05-26 | Discharge: 2017-05-26 | Disposition: A | Discharge status: Home / Self Care | Payer: Medicare HMO | Source: Ambulatory Visit | Attending: Radiation Oncology | Admitting: Radiation Oncology

## 2017-05-26 VITALS — BP 149/84 | HR 83 | Temp 97.5°F | Resp 18 | Ht 72.0 in | Wt 193.2 lb

## 2017-05-26 DIAGNOSIS — Z510 Encounter for antineoplastic radiation therapy: Secondary | ICD-10-CM | POA: Diagnosis not present

## 2017-05-26 DIAGNOSIS — C8333 Diffuse large B-cell lymphoma, intra-abdominal lymph nodes: Secondary | ICD-10-CM | POA: Diagnosis not present

## 2017-05-26 DIAGNOSIS — Z08 Encounter for follow-up examination after completed treatment for malignant neoplasm: Secondary | ICD-10-CM

## 2017-05-26 DIAGNOSIS — C61 Malignant neoplasm of prostate: Secondary | ICD-10-CM

## 2017-05-26 DIAGNOSIS — Z85038 Personal history of other malignant neoplasm of large intestine: Secondary | ICD-10-CM | POA: Diagnosis not present

## 2017-05-26 DIAGNOSIS — Z95828 Presence of other vascular implants and grafts: Secondary | ICD-10-CM

## 2017-05-26 DIAGNOSIS — C8338 Diffuse large B-cell lymphoma, lymph nodes of multiple sites: Secondary | ICD-10-CM | POA: Diagnosis not present

## 2017-05-26 DIAGNOSIS — Z8579 Personal history of other malignant neoplasms of lymphoid, hematopoietic and related tissues: Secondary | ICD-10-CM

## 2017-05-26 DIAGNOSIS — C8330 Diffuse large B-cell lymphoma, unspecified site: Secondary | ICD-10-CM | POA: Diagnosis not present

## 2017-05-26 MED ORDER — SODIUM CHLORIDE 0.9% FLUSH
10.0000 mL | INTRAVENOUS | Status: AC | PRN
  Administered 2017-05-26: 10 mL
  Filled 2017-05-26: qty 10

## 2017-05-26 MED ORDER — HEPARIN SOD (PORK) LOCK FLUSH 100 UNIT/ML IV SOLN
500.0000 [IU] | INTRAVENOUS | Status: AC | PRN
  Administered 2017-05-26: 500 [IU]

## 2017-05-26 NOTE — Progress Notes (Signed)
No new changes noted today 

## 2017-05-29 ENCOUNTER — Ambulatory Visit
Admission: RE | Admit: 2017-05-29 | Discharge: 2017-05-29 | Disposition: A | Discharge status: Home / Self Care | Payer: Medicare HMO | Source: Ambulatory Visit | Attending: Radiation Oncology | Admitting: Radiation Oncology

## 2017-05-29 DIAGNOSIS — Z510 Encounter for antineoplastic radiation therapy: Secondary | ICD-10-CM | POA: Diagnosis not present

## 2017-05-29 DIAGNOSIS — C61 Malignant neoplasm of prostate: Secondary | ICD-10-CM | POA: Diagnosis not present

## 2017-05-29 DIAGNOSIS — C8338 Diffuse large B-cell lymphoma, lymph nodes of multiple sites: Secondary | ICD-10-CM | POA: Diagnosis not present

## 2017-05-29 NOTE — Progress Notes (Signed)
Hematology/Oncology Consult note [facility]  Telephone:([phone] [phone]  Patient Care Team: Ezequiel Kayser, MD as PCP - General (Internal Medicine) Hollice Espy, MD as Consulting Physician (Urology)   Name of the patient: Christian Kelly  [id number]  06/11/1940   Date of visit: 05/29/17  Diagnosis- 1. 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 Stage II DLBCL GCB. FISH for double hit negative  Chief complaint/ Reason for visit- discuss PET/CT results  Heme/Onc history:1. Patient is a 77 yr old male with a h/o prostate cancer diagnosed in 1999s/p radical prostatectomy. Prior to that he had colon cancer in 1998 s/o surgery and adjuvant chemotherapy in 1998. With regards to prostate cancer- surgery was done at [facility] in [place] and he was followed at [place] subsequently. Patient think his Gleasons score was 7 at diagnosis. He has not required any radiation therapy or ADT so far. Patient still spends 4-5 months at [place].   2. Dr. Raechel Ache has been monitoring his PSArecently and his recent trend of PSA has been as follows: psa was 0.33 in feb 2017 and 0.63 in April 2018. We have 1 psa from 2015 when it was 0.3  3. Patient was referred to Dr. Erlene Quan urology and Dr. Baruch Gouty for salvage radiation.   4. Dr. Erlene Quan ordered CT abdomen which showed: IMPRESSION: 1. Status post prostatectomy, without locally recurrent disease. 2. Extensive abdominal adenopathy. Given the clinical history, most likely related to metastatic prostate carcinoma. Lymphoma could look similar. 3. Coronary artery atherosclerosis. Aortic atherosclerosis. 4. Vague upper sacral sclerosis is felt unlikely to represent metastatic disease, given lack of correlate on yesterday's bone scan. Correlate with radiation therapy, as radiation induced necrosis could have this appearance. Recommend attention on follow-up.  5. This was  followed by PET/CT scan which showed: IMPRESSION: 1. Hypermetabolic gastrohepatic ligament, abdominal retroperitoneal and small bowel mesenteric adenopathy, most indicative of lymphoma. 2. Aortic atherosclerosis (ICD10-170.0). Coronary artery Calcification.  6. Patient underwent CT-guided biopsy of the mesenteric lymph node which showed:DIAGNOSIS:  A. LYMPH NODE, MESENTERIC; CT-GUIDED CORE BIOPSY:  - LARGE B-CELL LYMPHOMA, CD10 POSITIVE.   Comment:  There is a diffuse proliferation of large lymphocytes with irregular  nuclear contours, inconspicuous nucleoli, and scant cytoplasm. The flow  cytometry and IHC results are most consistent with diffuse large B-cell  lymphoma, not otherwise specified, germinal center B-cell type. However,  it is likely that [place] for MYC, BCL2 and BCL6 gene rearrangements will  be ordered, so final classification will be based on the [place] result  7. Bone marrow biopsy was negative for lymphoma  8. 17% of nuclei were positive for BCL-2 rearrangement. 35% of nuclei had extra myc signals but no gene rearrangement for cmyc noted. This was consistent with evolved lymphoma or DLBCL. Not consistentwithwith double hit or double expressor  9. Interim scans after 3 cycles of RCHOP showed: IMPRESSION: 2.2 x 5.3 cm jejunal mesenteric nodal mass along the anterior mid abdomen, significantly improved.   10.PET/CT scan after 6 cycles of therapy showed mesenteric nodule mass was 5.9 x 2.3 cm with an SUV of 2.7. This was discussed at tumor board. Likelihood of this representing residual lymphoma is low given the low SUV uptake which has been similar to prior PET scan 3 months ago. Patient then received RT to his mesenteric mass    Interval history- he is still receiving radiation for his prostate which he will finish later this month. Reports mild fatigue. Wants to verify if he  has thrush on his tongue. Denies other complaints  ECOG PS- 1 Pain scale-  0  Review of systems- Review of Systems  Constitutional: Positive for malaise/fatigue. Negative for chills, fever and weight loss.  HENT: Negative for congestion, ear discharge and nosebleeds.   Eyes: Negative for blurred vision.  Respiratory: Negative for cough, hemoptysis, sputum production, shortness of breath and wheezing.   Cardiovascular: Negative for chest pain, palpitations, orthopnea and claudication.  Gastrointestinal: Negative for abdominal pain, blood in stool, constipation, diarrhea, heartburn, melena, nausea and vomiting.  Genitourinary: Negative for dysuria, flank pain, frequency, hematuria and urgency.  Musculoskeletal: Negative for back pain, joint pain and myalgias.  Skin: Negative for rash.  Neurological: Negative for dizziness, tingling, focal weakness, seizures, weakness and headaches.  Endo/Heme/Allergies: Does not bruise/bleed easily.  Psychiatric/Behavioral: Negative for depression and suicidal ideas. The patient does not have insomnia.       Allergies  Allergen Reactions  . Tape Rash     Past Medical History:  Diagnosis Date  . Arthritis   . Basal cell carcinoma 2008   forehead  . Cancer ([place])   . Chronic renal insufficiency, stage III (moderate) (HCC)   . Colon cancer ([place]) 1998  . Diabetes mellitus without complication ([place])    Controlled with diet only as of 11/01/2016  . History of kidney stones   . Hyperlipemia   . Hypertension   . Lyme disease   . Prostate cancer ([place]) 1999     Past Surgical History:  Procedure Laterality Date  . COLON SURGERY  1998  . COLONOSCOPY    . COLONOSCOPY WITH PROPOFOL N/A 12/29/2014   Procedure: COLONOSCOPY WITH PROPOFOL;  Surgeon: Lollie Sails, MD;  Location: [facility] ENDOSCOPY;  Service: Endoscopy;  Laterality: N/A;  . EYE SURGERY    . FRACTURE SURGERY    . IR FLUORO GUIDE PORT INSERTION RIGHT  09/01/2016  . LITHOTRIPSY    . PROSTATECTOMY  1998  . TONSILLECTOMY      Social History   Socioeconomic  History  . Marital status: Married    Spouse name: Not on file  . Number of children: Not on file  . Years of education: Not on file  . Highest education level: Not on file  Occupational History  . Not on file  Social Needs  . Financial resource strain: Not on file  . Food insecurity:    Worry: Not on file    Inability: Not on file  . Transportation needs:    Medical: Not on file    Non-medical: Not on file  Tobacco Use  . Smoking status: Former Smoker    Packs/day: 1.00    Years: 8.00    Pack years: 8.00    Types: Cigarettes    Last attempt to quit: 08/13/1974    Years since quitting: 42.8  . Smokeless tobacco: Former Systems developer    Types: Snuff  Substance and Sexual Activity  . Alcohol use: Yes    Comment: rarely-one or two beer  . Drug use: No  . Sexual activity: Not Currently  Lifestyle  . Physical activity:    Days per week: Not on file    Minutes per session: Not on file  . Stress: Not on file  Relationships  . Social connections:    Talks on phone: Not on file    Gets together: Not on file    Attends religious service: Not on file    Active member of club or organization: Not on file  Attends meetings of clubs or organizations: Not on file    Relationship status: Not on file  . Intimate partner violence:    Fear of current or ex partner: Not on file    Emotionally abused: Not on file    Physically abused: Not on file    Forced sexual activity: Not on file  Other Topics Concern  . Not on file  Social History Narrative  . Not on file    Family History  Problem Relation Age of Onset  . Coronary artery disease Mother   . Diabetes Mother   . Prostate cancer Father   . Pancreatitis Father   . Bladder Cancer Sister   . Diabetes Brother   . Diabetes Brother   . Prostate cancer Brother   . Diabetes Brother   . Diabetes Maternal Grandmother      Current Outpatient Medications:  .  ACCU-CHEK FASTCLIX LANCETS MISC, , Disp: , Rfl:  .  aspirin EC 81 MG  tablet, Take 81 mg by mouth every other day. , Disp: , Rfl:  .  atorvastatin (LIPITOR) 40 MG tablet, Take 40 mg by mouth daily., Disp: , Rfl:  .  Blood Glucose Monitoring Suppl (TRUE METRIX AIR GLUCOSE METER) w/Device KIT, , Disp: , Rfl:  .  hydrochlorothiazide (HYDRODIURIL) 25 MG tablet, Take 25 mg by mouth daily., Disp: , Rfl:  .  lidocaine-prilocaine (EMLA) cream, Apply to affected area once, Disp: 30 g, Rfl: 3 .  lisinopril (PRINIVIL,ZESTRIL) 10 MG tablet, , Disp: , Rfl:  .  Multiple Vitamin (MULTIVITAMIN) capsule, Take 1 capsule by mouth daily., Disp: , Rfl:  .  LORazepam (ATIVAN) 0.5 MG tablet, Take 1 tablet (0.5 mg total) by mouth every 6 (six) hours as needed (Nausea or vomiting). (Patient not taking: Reported on 02/07/2017), Disp: 30 tablet, Rfl: 0 .  omeprazole (PRILOSEC) 20 MG capsule, TAKE 1 CAPSULE BY MOUTH ONCE DAILY (Patient not taking: Reported on 02/07/2017), Disp: 30 capsule, Rfl: 1 .  ondansetron (ZOFRAN) 8 MG tablet, Take 1 tablet (8 mg total) by mouth 2 (two) times daily as needed for refractory nausea / vomiting. Start on day 3 after cyclophosphamide chemotherapy. (Patient not taking: Reported on 02/07/2017), Disp: 30 tablet, Rfl: 1 .  predniSONE (DELTASONE) 50 MG tablet, Take 2 tablets (100 mg total) by mouth daily. Take on days 1-5 of each chemotherapy given every 3 weeks (Patient not taking: Reported on 01/17/2017), Disp: 30 tablet, Rfl: 0 .  prochlorperazine (COMPAZINE) 10 MG tablet, Take 1 tablet (10 mg total) by mouth every 6 (six) hours as needed (Nausea or vomiting). (Patient not taking: Reported on 01/17/2017), Disp: 30 tablet, Rfl: 6 .  TRUE METRIX BLOOD GLUCOSE TEST test strip, , Disp: , Rfl:   Physical exam:  Vitals:   05/26/17 0958  BP: (!) 149/84  Pulse: 83  Resp: 18  Temp: (!) 97.5 F (36.4 C)  TempSrc: Tympanic  Weight: 193 lb 3.2 oz (87.6 kg)  Height: 6' (1.829 m)   Physical Exam  Constitutional: He is oriented to person, place, and time.  HENT:   Head: Normocephalic and atraumatic.  Mouth/Throat: Oropharynx is clear and moist.  Coated tongue but no thrush  Eyes: Pupils are equal, round, and reactive to light. EOM are normal.  Neck: Normal range of motion.  Cardiovascular: Normal rate, regular rhythm and normal heart sounds.  Pulmonary/Chest: Effort normal and breath sounds normal.  Abdominal: Soft. Bowel sounds are normal.  Lymphadenopathy:  No palpable cervical, supraclavicular, inguinal adenopathy  Neurological: He is alert and oriented to person, place, and time.  Skin: Skin is warm and dry.     CMP Latest Ref Rng & Units 05/24/2017  Glucose 65 - 99 mg/dL 187(H)  BUN 6 - 20 mg/dL 24(H)  Creatinine 0.61 - 1.24 mg/dL 0.98  Sodium 135 - 145 mmol/L 140  Potassium 3.5 - 5.1 mmol/L 4.1  Chloride 101 - 111 mmol/L 105  CO2 22 - 32 mmol/L 27  Calcium 8.9 - 10.3 mg/dL 9.3  Total Protein 6.5 - 8.1 g/dL 6.6  Total Bilirubin 0.3 - 1.2 mg/dL 0.6  Alkaline Phos 38 - 126 U/L 89  AST 15 - 41 U/L 30  ALT 17 - 63 U/L 39   CBC Latest Ref Rng & Units 05/24/2017  WBC 3.8 - 10.6 K/uL 5.1  Hemoglobin 13.0 - 18.0 g/dL 13.6  Hematocrit 40.0 - 52.0 % 38.4(L)  Platelets 150 - 440 K/uL 186    No images are attached to the encounter.  Nm Pet Image Restag (ps) Skull Base To Thigh  Result Date: 05/23/2017 CLINICAL DATA:  Subsequent treatment strategy for diffuse large B-cell lymphoma undergoing immunotherapy. Restaging. EXAM: NUCLEAR MEDICINE PET SKULL BASE TO THIGH TECHNIQUE: 9.7 mCi F-18 FDG was injected intravenously. Full-ring PET imaging was performed from the skull base to thigh after the radiotracer. CT data was obtained and used for attenuation correction and anatomic localization. Fasting blood glucose: 134 mg/dl COMPARISON:  01/04/2017 FINDINGS: Mediastinal blood pool activity: SUV max = 2.2 NECK:  No hypermetabolic lymph nodes or masses. Incidental CT findings:  None. CHEST: No hypermetabolic masses or lymphadenopathy. No suspicious  pulmonary nodules seen on CT images. Incidental CT findings:  None. ABDOMEN/PELVIS: No abnormal hypermetabolic activity within the liver, pancreas, adrenal glands, or spleen. Poorly defined mass or lymphadenopathy in the central small bowel mesentery measures 5.8 x 2.2 cm on image 209/3, without significant change compared to previous study. This has SUV max of 3.4 compared with 2.7 previously. No other hypermetabolic masses or lymphadenopathy identified within the abdomen or pelvis. Prior prostatectomy noted. Incidental CT findings:  Aortic atherosclerosis. SKELETON: No focal hypermetabolic bone lesions to suggest skeletal metastasis. Incidental CT findings:  None. IMPRESSION: No significant change in central small bowel mesenteric mass or lymphadenopathy showing low-grade metabolic activity. (Deauville score 3) No new or progressive sites of lymphoma identified. Electronically Signed   By: Earle Gell M.D.   On: 05/23/2017 13:20     Assessment and plan- Patient is a 77 y.o. male with stage II diffuse large B-cell lymphoma GCB status post 6 cycles of R CHOP chemotherapy followed by consolidative RT to mesenteric mass here to discuss PET CT scan results  1. DLBCL- I have personally reviewed PET/CT images independently. I have also reviewed images at tumor board and discussed his case at tumor board this week. His most recent PET after RT shows no change in the size of the mass as compared to PET done in Nov 2018 (which was done after 6 cycles of RCHOP). There is still persistent mesenteric mass which is smaller since initial presentation with mild residual hypermetabolism. Upon review at tumor board given that there was no change/ increase in size of mass over last 5 months- this probably does not indicate residual active malignancy. I will therefore get a f/u PET/CT scan in 3 months. If there is no worsening seen on PET in 3 months- I will continue to get surveillance CT scans up to 2 years from completion of  RCHOP  2. Prostate cancer- biochemoical recurrence many years after prostatectomy. Currently undergoing salvge RT. PSA is trending down and currently at 0.01 from 0.96 4 months ago. Continue to monitor  I will see him back in 3 months with cbc, cmp and LDH. PET/CT prior    Visit Diagnosis 1. Prostate cancer ([place])   2. Encounter for follow-up surveillance of diffuse large B-cell lymphoma      Dr. Randa Evens, MD, MPH [facility] [phone] 05/29/2017 8:02 AM

## 2017-05-30 ENCOUNTER — Ambulatory Visit
Admission: RE | Admit: 2017-05-30 | Discharge: 2017-05-30 | Disposition: A | Discharge status: Home / Self Care | Payer: Medicare HMO | Source: Ambulatory Visit | Attending: Radiation Oncology | Admitting: Radiation Oncology

## 2017-05-30 DIAGNOSIS — Z510 Encounter for antineoplastic radiation therapy: Secondary | ICD-10-CM | POA: Diagnosis not present

## 2017-05-30 DIAGNOSIS — C61 Malignant neoplasm of prostate: Secondary | ICD-10-CM | POA: Diagnosis not present

## 2017-05-30 DIAGNOSIS — C8338 Diffuse large B-cell lymphoma, lymph nodes of multiple sites: Secondary | ICD-10-CM | POA: Diagnosis not present

## 2017-05-31 ENCOUNTER — Ambulatory Visit
Admission: RE | Admit: 2017-05-31 | Discharge: 2017-05-31 | Disposition: A | Discharge status: Home / Self Care | Payer: Medicare HMO | Source: Ambulatory Visit | Attending: Radiation Oncology | Admitting: Radiation Oncology

## 2017-05-31 DIAGNOSIS — C61 Malignant neoplasm of prostate: Secondary | ICD-10-CM | POA: Diagnosis not present

## 2017-05-31 DIAGNOSIS — C8338 Diffuse large B-cell lymphoma, lymph nodes of multiple sites: Secondary | ICD-10-CM | POA: Diagnosis not present

## 2017-05-31 DIAGNOSIS — Z510 Encounter for antineoplastic radiation therapy: Secondary | ICD-10-CM | POA: Diagnosis not present

## 2017-06-01 ENCOUNTER — Ambulatory Visit
Admission: RE | Admit: 2017-06-01 | Discharge: 2017-06-01 | Disposition: A | Discharge status: Home / Self Care | Payer: Medicare HMO | Source: Ambulatory Visit | Attending: Radiation Oncology | Admitting: Radiation Oncology

## 2017-06-01 ENCOUNTER — Ambulatory Visit: Payer: Medicare HMO

## 2017-06-01 DIAGNOSIS — Z510 Encounter for antineoplastic radiation therapy: Secondary | ICD-10-CM | POA: Diagnosis not present

## 2017-06-01 DIAGNOSIS — C8338 Diffuse large B-cell lymphoma, lymph nodes of multiple sites: Secondary | ICD-10-CM | POA: Diagnosis not present

## 2017-06-01 DIAGNOSIS — C61 Malignant neoplasm of prostate: Secondary | ICD-10-CM | POA: Diagnosis not present

## 2017-06-02 ENCOUNTER — Ambulatory Visit
Admission: RE | Admit: 2017-06-02 | Discharge: 2017-06-02 | Disposition: A | Discharge status: Home / Self Care | Payer: Medicare HMO | Source: Ambulatory Visit | Attending: Radiation Oncology | Admitting: Radiation Oncology

## 2017-06-02 ENCOUNTER — Ambulatory Visit: Payer: Medicare HMO

## 2017-06-02 DIAGNOSIS — C61 Malignant neoplasm of prostate: Secondary | ICD-10-CM | POA: Diagnosis not present

## 2017-06-02 DIAGNOSIS — C8338 Diffuse large B-cell lymphoma, lymph nodes of multiple sites: Secondary | ICD-10-CM | POA: Diagnosis not present

## 2017-06-02 DIAGNOSIS — Z510 Encounter for antineoplastic radiation therapy: Secondary | ICD-10-CM | POA: Diagnosis not present

## 2017-06-05 ENCOUNTER — Telehealth: Payer: Self-pay | Admitting: Oncology

## 2017-06-05 ENCOUNTER — Ambulatory Visit
Admission: RE | Admit: 2017-06-05 | Discharge: 2017-06-05 | Disposition: A | Discharge status: Home / Self Care | Payer: Medicare HMO | Source: Ambulatory Visit | Attending: Radiation Oncology | Admitting: Radiation Oncology

## 2017-06-05 DIAGNOSIS — Z510 Encounter for antineoplastic radiation therapy: Secondary | ICD-10-CM | POA: Diagnosis not present

## 2017-06-05 DIAGNOSIS — C61 Malignant neoplasm of prostate: Secondary | ICD-10-CM | POA: Diagnosis not present

## 2017-06-05 DIAGNOSIS — C8338 Diffuse large B-cell lymphoma, lymph nodes of multiple sites: Secondary | ICD-10-CM | POA: Diagnosis not present

## 2017-06-05 NOTE — Telephone Encounter (Signed)
Patient called to rschd PET to 08/28/17 @ 9:30/Labs/MD appts ti 09/01/17 @ 1:45 pm. Appts rschd and conf per patient request.  L/M on V/M. Updated appts also mailed. MF

## 2017-06-06 ENCOUNTER — Ambulatory Visit
Admission: RE | Admit: 2017-06-06 | Discharge: 2017-06-06 | Disposition: A | Discharge status: Home / Self Care | Payer: Medicare HMO | Source: Ambulatory Visit | Attending: Radiation Oncology | Admitting: Radiation Oncology

## 2017-06-06 DIAGNOSIS — Z23 Encounter for immunization: Secondary | ICD-10-CM | POA: Diagnosis not present

## 2017-06-06 DIAGNOSIS — C61 Malignant neoplasm of prostate: Secondary | ICD-10-CM | POA: Diagnosis not present

## 2017-06-06 DIAGNOSIS — Z79899 Other long term (current) drug therapy: Secondary | ICD-10-CM | POA: Diagnosis not present

## 2017-06-06 DIAGNOSIS — C8333 Diffuse large B-cell lymphoma, intra-abdominal lymph nodes: Secondary | ICD-10-CM | POA: Diagnosis not present

## 2017-06-06 DIAGNOSIS — D126 Benign neoplasm of colon, unspecified: Secondary | ICD-10-CM | POA: Diagnosis not present

## 2017-06-06 DIAGNOSIS — Z510 Encounter for antineoplastic radiation therapy: Secondary | ICD-10-CM | POA: Diagnosis not present

## 2017-06-06 DIAGNOSIS — Z85038 Personal history of other malignant neoplasm of large intestine: Secondary | ICD-10-CM | POA: Diagnosis not present

## 2017-06-06 DIAGNOSIS — I10 Essential (primary) hypertension: Secondary | ICD-10-CM | POA: Diagnosis not present

## 2017-06-06 DIAGNOSIS — E1122 Type 2 diabetes mellitus with diabetic chronic kidney disease: Secondary | ICD-10-CM | POA: Diagnosis not present

## 2017-06-06 DIAGNOSIS — N182 Chronic kidney disease, stage 2 (mild): Secondary | ICD-10-CM | POA: Diagnosis not present

## 2017-06-06 DIAGNOSIS — C8338 Diffuse large B-cell lymphoma, lymph nodes of multiple sites: Secondary | ICD-10-CM | POA: Diagnosis not present

## 2017-06-06 DIAGNOSIS — E782 Mixed hyperlipidemia: Secondary | ICD-10-CM | POA: Diagnosis not present

## 2017-06-06 DIAGNOSIS — C8330 Diffuse large B-cell lymphoma, unspecified site: Secondary | ICD-10-CM | POA: Diagnosis not present

## 2017-06-07 ENCOUNTER — Ambulatory Visit
Admission: RE | Admit: 2017-06-07 | Discharge: 2017-06-07 | Disposition: A | Discharge status: Home / Self Care | Payer: Medicare HMO | Source: Ambulatory Visit | Attending: Radiation Oncology | Admitting: Radiation Oncology

## 2017-06-07 ENCOUNTER — Other Ambulatory Visit: Payer: Self-pay

## 2017-06-07 ENCOUNTER — Inpatient Hospital Stay: Payer: Medicare HMO

## 2017-06-07 DIAGNOSIS — C8338 Diffuse large B-cell lymphoma, lymph nodes of multiple sites: Secondary | ICD-10-CM | POA: Diagnosis not present

## 2017-06-07 DIAGNOSIS — Z85038 Personal history of other malignant neoplasm of large intestine: Secondary | ICD-10-CM | POA: Diagnosis not present

## 2017-06-07 DIAGNOSIS — C61 Malignant neoplasm of prostate: Secondary | ICD-10-CM | POA: Diagnosis not present

## 2017-06-07 DIAGNOSIS — C8330 Diffuse large B-cell lymphoma, unspecified site: Secondary | ICD-10-CM | POA: Diagnosis not present

## 2017-06-07 DIAGNOSIS — Z510 Encounter for antineoplastic radiation therapy: Secondary | ICD-10-CM | POA: Diagnosis not present

## 2017-06-07 LAB — CBC
HCT: 34.2 % — ABNORMAL LOW (ref 40.0–52.0)
HEMOGLOBIN: 12.1 g/dL — AB (ref 13.0–18.0)
MCH: 32.3 pg (ref 26.0–34.0)
MCHC: 35.5 g/dL (ref 32.0–36.0)
MCV: 91 fL (ref 80.0–100.0)
PLATELETS: 191 10*3/uL (ref 150–440)
RBC: 3.75 MIL/uL — AB (ref 4.40–5.90)
RDW: 13.8 % (ref 11.5–14.5)
WBC: 3.2 10*3/uL — ABNORMAL LOW (ref 3.8–10.6)

## 2017-06-08 ENCOUNTER — Ambulatory Visit
Admission: RE | Admit: 2017-06-08 | Discharge: 2017-06-08 | Disposition: A | Discharge status: Home / Self Care | Payer: Medicare HMO | Source: Ambulatory Visit | Attending: Radiation Oncology | Admitting: Radiation Oncology

## 2017-06-08 DIAGNOSIS — C61 Malignant neoplasm of prostate: Secondary | ICD-10-CM | POA: Diagnosis not present

## 2017-06-08 DIAGNOSIS — C8338 Diffuse large B-cell lymphoma, lymph nodes of multiple sites: Secondary | ICD-10-CM | POA: Diagnosis not present

## 2017-06-08 DIAGNOSIS — Z510 Encounter for antineoplastic radiation therapy: Secondary | ICD-10-CM | POA: Diagnosis not present

## 2017-06-09 ENCOUNTER — Ambulatory Visit: Payer: Medicare HMO

## 2017-06-12 ENCOUNTER — Ambulatory Visit: Payer: Medicare HMO

## 2017-06-13 ENCOUNTER — Ambulatory Visit
Admission: RE | Admit: 2017-06-13 | Discharge: 2017-06-13 | Disposition: A | Discharge status: Home / Self Care | Payer: Medicare HMO | Source: Ambulatory Visit | Attending: Radiation Oncology | Admitting: Radiation Oncology

## 2017-06-13 DIAGNOSIS — Z510 Encounter for antineoplastic radiation therapy: Secondary | ICD-10-CM | POA: Diagnosis not present

## 2017-06-13 DIAGNOSIS — C61 Malignant neoplasm of prostate: Secondary | ICD-10-CM | POA: Diagnosis not present

## 2017-06-13 DIAGNOSIS — C8338 Diffuse large B-cell lymphoma, lymph nodes of multiple sites: Secondary | ICD-10-CM | POA: Diagnosis not present

## 2017-06-14 ENCOUNTER — Ambulatory Visit
Admission: RE | Admit: 2017-06-14 | Discharge: 2017-06-14 | Disposition: A | Discharge status: Home / Self Care | Payer: Medicare HMO | Source: Ambulatory Visit | Attending: Radiation Oncology | Admitting: Radiation Oncology

## 2017-06-14 DIAGNOSIS — Z510 Encounter for antineoplastic radiation therapy: Secondary | ICD-10-CM | POA: Diagnosis not present

## 2017-06-14 DIAGNOSIS — C61 Malignant neoplasm of prostate: Secondary | ICD-10-CM | POA: Diagnosis not present

## 2017-06-14 DIAGNOSIS — C8338 Diffuse large B-cell lymphoma, lymph nodes of multiple sites: Secondary | ICD-10-CM | POA: Diagnosis not present

## 2017-06-15 ENCOUNTER — Ambulatory Visit
Admission: RE | Admit: 2017-06-15 | Discharge: 2017-06-15 | Disposition: A | Discharge status: Home / Self Care | Payer: Medicare HMO | Source: Ambulatory Visit | Attending: Radiation Oncology | Admitting: Radiation Oncology

## 2017-06-15 ENCOUNTER — Ambulatory Visit: Payer: Medicare HMO

## 2017-06-15 DIAGNOSIS — C61 Malignant neoplasm of prostate: Secondary | ICD-10-CM | POA: Diagnosis not present

## 2017-06-15 DIAGNOSIS — C8338 Diffuse large B-cell lymphoma, lymph nodes of multiple sites: Secondary | ICD-10-CM | POA: Diagnosis not present

## 2017-06-15 DIAGNOSIS — Z510 Encounter for antineoplastic radiation therapy: Secondary | ICD-10-CM | POA: Diagnosis not present

## 2017-06-16 ENCOUNTER — Ambulatory Visit
Admission: RE | Admit: 2017-06-16 | Discharge: 2017-06-16 | Disposition: A | Discharge status: Home / Self Care | Payer: Medicare HMO | Source: Ambulatory Visit | Attending: Radiation Oncology | Admitting: Radiation Oncology

## 2017-06-16 ENCOUNTER — Ambulatory Visit: Payer: Medicare HMO

## 2017-06-16 DIAGNOSIS — C61 Malignant neoplasm of prostate: Secondary | ICD-10-CM | POA: Diagnosis not present

## 2017-06-16 DIAGNOSIS — Z510 Encounter for antineoplastic radiation therapy: Secondary | ICD-10-CM | POA: Diagnosis not present

## 2017-06-16 DIAGNOSIS — C8338 Diffuse large B-cell lymphoma, lymph nodes of multiple sites: Secondary | ICD-10-CM | POA: Diagnosis not present

## 2017-06-19 ENCOUNTER — Ambulatory Visit
Admission: RE | Admit: 2017-06-19 | Discharge: 2017-06-19 | Disposition: A | Discharge status: Home / Self Care | Payer: Medicare HMO | Source: Ambulatory Visit | Attending: Radiation Oncology | Admitting: Radiation Oncology

## 2017-06-19 DIAGNOSIS — Z510 Encounter for antineoplastic radiation therapy: Secondary | ICD-10-CM | POA: Diagnosis not present

## 2017-06-19 DIAGNOSIS — C8338 Diffuse large B-cell lymphoma, lymph nodes of multiple sites: Secondary | ICD-10-CM | POA: Diagnosis not present

## 2017-06-19 DIAGNOSIS — C61 Malignant neoplasm of prostate: Secondary | ICD-10-CM | POA: Diagnosis not present

## 2017-07-05 ENCOUNTER — Inpatient Hospital Stay: Payer: Medicare HMO | Attending: Oncology

## 2017-07-05 DIAGNOSIS — C61 Malignant neoplasm of prostate: Secondary | ICD-10-CM | POA: Insufficient documentation

## 2017-07-05 DIAGNOSIS — Z452 Encounter for adjustment and management of vascular access device: Secondary | ICD-10-CM | POA: Diagnosis not present

## 2017-07-05 DIAGNOSIS — Z95828 Presence of other vascular implants and grafts: Secondary | ICD-10-CM

## 2017-07-05 MED ORDER — HEPARIN SOD (PORK) LOCK FLUSH 100 UNIT/ML IV SOLN
500.0000 [IU] | Freq: Once | INTRAVENOUS | Status: AC
  Administered 2017-07-05: 500 [IU] via INTRAVENOUS

## 2017-07-05 MED ORDER — SODIUM CHLORIDE 0.9% FLUSH
10.0000 mL | Freq: Once | INTRAVENOUS | Status: AC
  Administered 2017-07-05: 10 mL via INTRAVENOUS
  Filled 2017-07-05: qty 10

## 2017-07-11 ENCOUNTER — Ambulatory Visit
Admission: RE | Admit: 2017-07-11 | Discharge: 2017-07-11 | Disposition: A | Discharge status: Home / Self Care | Payer: Medicare HMO | Source: Ambulatory Visit | Attending: Radiation Oncology | Admitting: Radiation Oncology

## 2017-07-11 ENCOUNTER — Other Ambulatory Visit: Payer: Self-pay

## 2017-07-11 ENCOUNTER — Encounter: Payer: Self-pay | Admitting: Radiation Oncology

## 2017-07-11 VITALS — BP 132/56 | HR 88 | Temp 99.5°F | Resp 18 | Wt 194.0 lb

## 2017-07-11 DIAGNOSIS — Z923 Personal history of irradiation: Secondary | ICD-10-CM | POA: Diagnosis not present

## 2017-07-11 DIAGNOSIS — C61 Malignant neoplasm of prostate: Secondary | ICD-10-CM | POA: Diagnosis not present

## 2017-07-11 NOTE — Progress Notes (Signed)
Radiation Oncology Follow up Note  Name: Christian Kelly   Date:   07/11/2017 MRN:  [id number] DOB: 12/12/1940    This 77 y.o. male presents to the clinic today for one-month follow-up status post radiation therapy for adenocarcinoma the prostate.  REFERRING PROVIDER: Ezequiel Kayser, MD  HPI: patient is a 77 year old male who was completed involved field radiation therapy to his abdomen for diffuse large B-cell lymphoma status post R CHOP chemotherapy. He is now 1 month out also completing radiation therapy to his prostate fossa as salvage in a patient undergoing biochemical failure status post prostatectomy he is seen today in routine follow-up and is doing well. He feels he has some congestion may be related to URI. He specifically denies increased lower urinary tract symptoms or diarrhea he has no abdominal complaints. COMPLICATIONS OF TREATMENT: none  FOLLOW UP COMPLIANCE: keeps appointments   PHYSICAL EXAM:  BP (!) 132/56   Pulse 88   Temp 99.5 F (37.5 C)   Resp 18   Wt 194 lb 0.1 oz (88 kg)   BMI 26.31 kg/m  Well-developed well-nourished patient in NAD. HEENT reveals PERLA, EOMI, discs not visualized.  Oral cavity is clear. No oral mucosal lesions are identified. Neck is clear without evidence of cervical or supraclavicular adenopathy. Lungs are clear to A&P. Cardiac examination is essentially unremarkable with regular rate and rhythm without murmur rub or thrill. Abdomen is benign with no organomegaly or masses noted. Motor sensory and DTR levels are equal and symmetric in the upper and lower extremities. Cranial nerves II through XII are grossly intact. Proprioception is intact. No peripheral adenopathy or edema is identified. No motor or sensory levels are noted. Crude visual fields are within normal range.  RADIOLOGY RESULTS: PET scan scheduled for several weeks which I will review  PLAN: at the present time patient is doing well from a urologic standpoint. He has a PET scan  scheduled in several weeks which I will review when available. I've asked him to start Claritin-D for his congestion which may be seasonally related. Otherwise I've asked to see him back in 3 months and will obtain a PSA prior to that visit. Patient knows to call sooner with any concerns.  I would like to take this opportunity to thank you for allowing me to participate in the care of your patient.Noreene Filbert, MD

## 2017-07-24 ENCOUNTER — Ambulatory Visit: Payer: Medicare HMO | Admitting: Radiation Oncology

## 2017-08-23 DIAGNOSIS — M542 Cervicalgia: Secondary | ICD-10-CM | POA: Diagnosis not present

## 2017-08-23 DIAGNOSIS — M9901 Segmental and somatic dysfunction of cervical region: Secondary | ICD-10-CM | POA: Diagnosis not present

## 2017-08-23 DIAGNOSIS — M62830 Muscle spasm of back: Secondary | ICD-10-CM | POA: Diagnosis not present

## 2017-08-23 DIAGNOSIS — G243 Spasmodic torticollis: Secondary | ICD-10-CM | POA: Diagnosis not present

## 2017-08-25 ENCOUNTER — Ambulatory Visit: Payer: Medicare HMO

## 2017-08-28 ENCOUNTER — Ambulatory Visit
Admission: RE | Admit: 2017-08-28 | Discharge: 2017-08-28 | Disposition: A | Discharge status: Home / Self Care | Payer: Medicare HMO | Source: Ambulatory Visit | Attending: Oncology | Admitting: Oncology

## 2017-08-28 DIAGNOSIS — I2510 Atherosclerotic heart disease of native coronary artery without angina pectoris: Secondary | ICD-10-CM | POA: Diagnosis not present

## 2017-08-28 DIAGNOSIS — R1909 Other intra-abdominal and pelvic swelling, mass and lump: Secondary | ICD-10-CM | POA: Diagnosis not present

## 2017-08-28 DIAGNOSIS — I700 Atherosclerosis of aorta: Secondary | ICD-10-CM | POA: Diagnosis not present

## 2017-08-28 DIAGNOSIS — C61 Malignant neoplasm of prostate: Secondary | ICD-10-CM

## 2017-08-28 LAB — GLUCOSE, CAPILLARY: GLUCOSE-CAPILLARY: 136 mg/dL — AB (ref 70–99)

## 2017-08-28 MED ORDER — FLUDEOXYGLUCOSE F - 18 (FDG) INJECTION
10.0100 | Freq: Once | INTRAVENOUS | Status: AC | PRN
  Administered 2017-08-28: 10.01 via INTRAVENOUS

## 2017-08-29 ENCOUNTER — Other Ambulatory Visit: Payer: Medicare HMO

## 2017-08-29 ENCOUNTER — Ambulatory Visit: Payer: Medicare HMO | Admitting: Oncology

## 2017-09-01 ENCOUNTER — Encounter: Payer: Self-pay | Admitting: Oncology

## 2017-09-01 ENCOUNTER — Inpatient Hospital Stay (HOSPITAL_BASED_OUTPATIENT_CLINIC_OR_DEPARTMENT_OTHER): Payer: Medicare HMO | Admitting: Oncology

## 2017-09-01 ENCOUNTER — Inpatient Hospital Stay: Payer: Medicare HMO | Attending: Oncology

## 2017-09-01 ENCOUNTER — Other Ambulatory Visit: Payer: Self-pay

## 2017-09-01 ENCOUNTER — Encounter: Payer: Self-pay | Admitting: *Deleted

## 2017-09-01 VITALS — BP 119/73 | HR 78 | Temp 97.1°F | Resp 18 | Ht 72.0 in | Wt 190.7 lb

## 2017-09-01 DIAGNOSIS — C61 Malignant neoplasm of prostate: Secondary | ICD-10-CM

## 2017-09-01 DIAGNOSIS — Z85038 Personal history of other malignant neoplasm of large intestine: Secondary | ICD-10-CM

## 2017-09-01 DIAGNOSIS — Z08 Encounter for follow-up examination after completed treatment for malignant neoplasm: Secondary | ICD-10-CM

## 2017-09-01 DIAGNOSIS — Z87891 Personal history of nicotine dependence: Secondary | ICD-10-CM | POA: Insufficient documentation

## 2017-09-01 DIAGNOSIS — C8333 Diffuse large B-cell lymphoma, intra-abdominal lymph nodes: Secondary | ICD-10-CM

## 2017-09-01 DIAGNOSIS — Z8579 Personal history of other malignant neoplasms of lymphoid, hematopoietic and related tissues: Secondary | ICD-10-CM

## 2017-09-01 LAB — CBC
HCT: 35.8 % — ABNORMAL LOW (ref 40.0–52.0)
HEMOGLOBIN: 12.4 g/dL — AB (ref 13.0–18.0)
MCH: 32.7 pg (ref 26.0–34.0)
MCHC: 34.7 g/dL (ref 32.0–36.0)
MCV: 94.3 fL (ref 80.0–100.0)
PLATELETS: 257 10*3/uL (ref 150–440)
RBC: 3.79 MIL/uL — AB (ref 4.40–5.90)
RDW: 13.5 % (ref 11.5–14.5)
WBC: 6.5 10*3/uL (ref 3.8–10.6)

## 2017-09-01 MED ORDER — HEPARIN SOD (PORK) LOCK FLUSH 100 UNIT/ML IV SOLN
500.0000 [IU] | Freq: Once | INTRAVENOUS | Status: AC
  Administered 2017-09-01: 500 [IU] via INTRAVENOUS

## 2017-09-01 MED ORDER — SODIUM CHLORIDE 0.9% FLUSH
10.0000 mL | Freq: Once | INTRAVENOUS | Status: AC
  Administered 2017-09-01: 10 mL via INTRAVENOUS
  Filled 2017-09-01: qty 10

## 2017-09-01 NOTE — Progress Notes (Signed)
No new changes noted today 

## 2017-09-02 NOTE — Progress Notes (Signed)
Hematology/Oncology Consult note [facility]  [phone] [phone]  Patient Care Team: Ezequiel Kayser, MD as PCP - General (Internal Medicine) Hollice Espy, MD as Consulting Physician (Urology)   Name of the patient: Christian Kelly  [id number]  1940/12/21   Date of visit: 09/02/17  Diagnosis- 1. . 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 Stage II DLBCL GCB. FISH for double hit negative   Chief complaint/ Reason for visit- routine f/u of DLBCL  Heme/Onc history: 1. Patient is a 77 yr old male with a h/o prostate cancer diagnosed in 1999s/p radical prostatectomy. Prior to that he had colon cancer in 1998 s/o surgery and adjuvant chemotherapy in 1998. With regards to prostate cancer- surgery was done at [facility] in [place] and he was followed at [place] subsequently. Patient think his Gleasons score was 7 at diagnosis. He has not required any radiation therapy or ADT so far. Patient still spends 4-5 months at [place].   2. Dr. Raechel Ache has been monitoring his PSArecently and his recent trend of PSA has been as follows: psa was 0.33 in feb 2017 and 0.63 in April 2018. We have 1 psa from 2015 when it was 0.3  3. Patient was referred to Dr. Erlene Quan urology and Dr. Baruch Gouty for salvage radiation.   4. Dr. Erlene Quan ordered CT abdomen which showed: IMPRESSION: 1. Status post prostatectomy, without locally recurrent disease. 2. Extensive abdominal adenopathy. Given the clinical history, most likely related to metastatic prostate carcinoma. Lymphoma could look similar. 3. Coronary artery atherosclerosis. Aortic atherosclerosis. 4. Vague upper sacral sclerosis is felt unlikely to represent metastatic disease, given lack of correlate on yesterday's bone scan. Correlate with radiation therapy, as radiation induced necrosis could have this appearance. Recommend attention on follow-up.  5. This was  followed by PET/CT scan which showed: IMPRESSION: 1. Hypermetabolic gastrohepatic ligament, abdominal retroperitoneal and small bowel mesenteric adenopathy, most indicative of lymphoma. 2. Aortic atherosclerosis (ICD10-170.0). Coronary artery Calcification.  6. Patient underwent CT-guided biopsy of the mesenteric lymph node which showed:DIAGNOSIS:  A. LYMPH NODE, MESENTERIC; CT-GUIDED CORE BIOPSY:  - LARGE B-CELL LYMPHOMA, CD10 POSITIVE.   Comment:  There is a diffuse proliferation of large lymphocytes with irregular  nuclear contours, inconspicuous nucleoli, and scant cytoplasm. The flow  cytometry and IHC results are most consistent with diffuse large B-cell  lymphoma, not otherwise specified, germinal center B-cell type. However,  it is likely that [place] for MYC, BCL2 and BCL6 gene rearrangements will  be ordered, so final classification will be based on the [place] result  7. Bone marrow biopsy was negative for lymphoma  8. 17% of nuclei were positive for BCL-2 rearrangement. 35% of nuclei had extra myc signals but no gene rearrangement for cmyc noted. This was consistent with evolved lymphoma or DLBCL. Not consistentwithwith double hit or double expressor  9. Interim scans after 3 cycles of RCHOP showed: IMPRESSION: 2.2 x 5.3 cm jejunal mesenteric nodal mass along the anterior mid abdomen, significantly improved.   10.PET/CT scan after 6 cycles of therapy showed mesenteric nodule mass was 5.9 x 2.3 cm with an SUV of 2.7. This was discussed at tumor board. Likelihood of this representing residual lymphoma is low given the low SUV uptake which has been similar to prior PET scan 3 months ago. Patient then received RT to his mesenteric mass  Interval history- he feels well. Energy levels are better. Denies any unintentional weight loss, night sweats aches or pains anywhere  ECOG PS-  0 Pain scale- 0 Opioid associated constipation- no  Review of systems- Review of Systems   Constitutional: Negative for chills, fever, malaise/fatigue and weight loss.  HENT: Negative for congestion, ear discharge and nosebleeds.   Eyes: Negative for blurred vision.  Respiratory: Negative for cough, hemoptysis, sputum production, shortness of breath and wheezing.   Cardiovascular: Negative for chest pain, palpitations, orthopnea and claudication.  Gastrointestinal: Negative for abdominal pain, blood in stool, constipation, diarrhea, heartburn, melena, nausea and vomiting.  Genitourinary: Negative for dysuria, flank pain, frequency, hematuria and urgency.  Musculoskeletal: Negative for back pain, joint pain and myalgias.  Skin: Negative for rash.  Neurological: Negative for dizziness, tingling, focal weakness, seizures, weakness and headaches.  Endo/Heme/Allergies: Does not bruise/bleed easily.  Psychiatric/Behavioral: Negative for depression and suicidal ideas. The patient does not have insomnia.       Allergies  Allergen Reactions  . Tape Rash     Past Medical History:  Diagnosis Date  . Arthritis   . Basal cell carcinoma 2008   forehead  . Cancer ([place])   . Chronic renal insufficiency, stage III (moderate) (HCC)   . Colon cancer ([place]) 1998  . Diabetes mellitus without complication ([place])    Controlled with diet only as of 11/01/2016  . H/O onychomycosis   . History of kidney stones   . Hyperlipemia   . Hypertension   . Lyme disease   . Prostate cancer ([place]) 1999     Past Surgical History:  Procedure Laterality Date  . COLON SURGERY  1998  . COLONOSCOPY    . COLONOSCOPY WITH PROPOFOL N/A 12/29/2014   Procedure: COLONOSCOPY WITH PROPOFOL;  Surgeon: Lollie Sails, MD;  Location: [facility];  Service: Endoscopy;  Laterality: N/A;  . EYE SURGERY    . FRACTURE SURGERY    . IR FLUORO GUIDE PORT INSERTION RIGHT  09/01/2016  . LITHOTRIPSY    . PROSTATECTOMY  1998  . TONSILLECTOMY      Social History   Socioeconomic History  . Marital status: Married      Spouse name: Not on file  . Number of children: Not on file  . Years of education: Not on file  . Highest education level: Not on file  Occupational History  . Not on file  Social Needs  . Financial resource strain: Not on file  . Food insecurity:    Worry: Not on file    Inability: Not on file  . Transportation needs:    Medical: Not on file    Non-medical: Not on file  Tobacco Use  . Smoking status: Former Smoker    Packs/day: 1.00    Years: 8.00    Pack years: 8.00    Types: Cigarettes    Last attempt to quit: 08/13/1974    Years since quitting: 43.0  . Smokeless tobacco: Former Systems developer    Types: Snuff  Substance and Sexual Activity  . Alcohol use: Yes    Comment: rarely-one or two beer  . Drug use: No  . Sexual activity: Not Currently  Lifestyle  . Physical activity:    Days per week: Not on file    Minutes per session: Not on file  . Stress: Not on file  Relationships  . Social connections:    Talks on phone: Not on file    Gets together: Not on file    Attends religious service: Not on file    Active member of club or organization: Not on file    Attends  meetings of clubs or organizations: Not on file    Relationship status: Not on file  . Intimate partner violence:    Fear of current or ex partner: Not on file    Emotionally abused: Not on file    Physically abused: Not on file    Forced sexual activity: Not on file  Other Topics Concern  . Not on file  Social History Narrative  . Not on file    Family History  Problem Relation Age of Onset  . Coronary artery disease Mother   . Diabetes Mother   . Prostate cancer Father   . Pancreatitis Father   . Bladder Cancer Sister   . Diabetes Brother   . Diabetes Brother   . Prostate cancer Brother   . Diabetes Brother   . Diabetes Maternal Grandmother      Current Outpatient Medications:  .  atorvastatin (LIPITOR) 40 MG tablet, Take 40 mg by mouth daily., Disp: , Rfl:  .  hydrochlorothiazide  (HYDRODIURIL) 25 MG tablet, Take 25 mg by mouth daily., Disp: , Rfl:  .  lisinopril (PRINIVIL,ZESTRIL) 10 MG tablet, , Disp: , Rfl:  .  metFORMIN (GLUCOPHAGE) 500 MG tablet, Take 500 mg by mouth 2 (two) times daily with a meal., Disp: , Rfl:  .  Multiple Vitamin (MULTIVITAMIN) capsule, Take 1 capsule by mouth daily., Disp: , Rfl:  .  ACCU-CHEK FASTCLIX LANCETS MISC, , Disp: , Rfl:  .  aspirin EC 81 MG tablet, Take 81 mg by mouth every other day. , Disp: , Rfl:  .  Blood Glucose Monitoring Suppl (TRUE METRIX AIR GLUCOSE METER) w/Device KIT, , Disp: , Rfl:  .  lidocaine-prilocaine (EMLA) cream, Apply to affected area once (Patient not taking: Reported on 09/01/2017), Disp: 30 g, Rfl: 3 .  LORazepam (ATIVAN) 0.5 MG tablet, Take 1 tablet (0.5 mg total) by mouth every 6 (six) hours as needed (Nausea or vomiting). (Patient not taking: Reported on 02/07/2017), Disp: 30 tablet, Rfl: 0 .  omeprazole (PRILOSEC) 20 MG capsule, TAKE 1 CAPSULE BY MOUTH ONCE DAILY (Patient not taking: Reported on 02/07/2017), Disp: 30 capsule, Rfl: 1 .  ondansetron (ZOFRAN) 8 MG tablet, Take 1 tablet (8 mg total) by mouth 2 (two) times daily as needed for refractory nausea / vomiting. Start on day 3 after cyclophosphamide chemotherapy. (Patient not taking: Reported on 02/07/2017), Disp: 30 tablet, Rfl: 1 .  predniSONE (DELTASONE) 50 MG tablet, Take 2 tablets (100 mg total) by mouth daily. Take on days 1-5 of each chemotherapy given every 3 weeks (Patient not taking: Reported on 01/17/2017), Disp: 30 tablet, Rfl: 0 .  prochlorperazine (COMPAZINE) 10 MG tablet, Take 1 tablet (10 mg total) by mouth every 6 (six) hours as needed (Nausea or vomiting). (Patient not taking: Reported on 01/17/2017), Disp: 30 tablet, Rfl: 6 .  TRUE METRIX BLOOD GLUCOSE TEST test strip, , Disp: , Rfl:   Physical exam:  Vitals:   09/01/17 1409  BP: 119/73  Pulse: 78  Resp: 18  Temp: (!) 97.1 F (36.2 C)  TempSrc: Tympanic  Weight: 190 lb 11.2 oz (86.5  kg)  Height: 6' (1.829 m)   Physical Exam  Constitutional: He is oriented to person, place, and time. He appears well-developed and well-nourished.  HENT:  Head: Normocephalic and atraumatic.  Eyes: Pupils are equal, round, and reactive to light. EOM are normal.  Neck: Normal range of motion.  Cardiovascular: Normal rate, regular rhythm and normal heart sounds.  Pulmonary/Chest: Effort normal and  breath sounds normal.  Abdominal: Soft. Bowel sounds are normal.  No palpable splenomegaly  Lymphadenopathy:  No palpable cervical, supraclavicular, axillary or inguinal adenopathy   Neurological: He is alert and oriented to person, place, and time.  Skin: Skin is warm and dry.     CMP Latest Ref Rng & Units 05/24/2017  Glucose 65 - 99 mg/dL 187(H)  BUN 6 - 20 mg/dL 24(H)  Creatinine 0.61 - 1.24 mg/dL 0.98  Sodium 135 - 145 mmol/L 140  Potassium 3.5 - 5.1 mmol/L 4.1  Chloride 101 - 111 mmol/L 105  CO2 22 - 32 mmol/L 27  Calcium 8.9 - 10.3 mg/dL 9.3  Total Protein 6.5 - 8.1 g/dL 6.6  Total Bilirubin 0.3 - 1.2 mg/dL 0.6  Alkaline Phos 38 - 126 U/L 89  AST 15 - 41 U/L 30  ALT 17 - 63 U/L 39   CBC Latest Ref Rng & Units 09/01/2017  WBC 3.8 - 10.6 K/uL 6.5  Hemoglobin 13.0 - 18.0 g/dL 12.4(L)  Hematocrit 40.0 - 52.0 % 35.8(L)  Platelets 150 - 440 K/uL 257    No images are attached to the encounter.  Nm Pet Image Restag (ps) Skull Base To Thigh  Result Date: 08/28/2017 CLINICAL DATA:  Subsequent treatment strategy for diffuse large B-cell lymphoma. Prostate cancer. EXAM: NUCLEAR MEDICINE PET SKULL BASE TO THIGH TECHNIQUE: 10.0 mCi F-18 FDG was injected intravenously. Full-ring PET imaging was performed from the skull base to thigh after the radiotracer. CT data was obtained and used for attenuation correction and anatomic localization. Fasting blood glucose: 136 mg/dl COMPARISON:  Multiple exams, including 05/23/2017 FINDINGS: Mediastinal blood pool activity: SUV max 2.2 Background liver  activity: 3.7 NECK: No significant abnormal hypermetabolic activity in this region. Incidental CT findings: none CHEST: No significant abnormal hypermetabolic activity in this region. Incidental CT findings: Right Port-A-Cath tip: Cavoatrial junction. Coronary, aortic arch, and branch vessel atherosclerotic vascular disease. Mild cardiomegaly. Incidental small bulla in the left lower lobe on image 127/3. ABDOMEN/PELVIS: Central mesenteric mass 7.6 by 2.2 cm on image 192/3, previously the same by my measurement. Maximum SUV 2.8, previously 3.6 by my measurements. Surrounding stranding in the mesentery is once again observed. Physiologic activity in bowel, especially in segments of the colon and at the rectosigmoid junction. No compelling findings of local recurrence at the anastomotic site. Distal rectal wall thickening with accentuated activity, less likely incidental given that there is no accentuated activity in this vicinity on the prior exam. Incidental CT findings: Left mid kidney photopenic hypodense lesion posteriorly, favoring cyst. Similar left kidney upper pole lesion is likely photopenic and accordingly likely a cyst. Aortoiliac atherosclerotic vascular disease. Contracted urinary bladder. Prostatectomy. SKELETON: Relative photopenia in the pelvis and lower lumbar spine quite likely from prior radiation therapy. Incidental CT findings: Bridging spurring of the left sacroiliac joint. Mild lower lumbar spondylosis. IMPRESSION: 1. Similar size of confluent central mesenteric adenopathy/mass, although the maximum standard uptake value is mildly improved, currently 2.8 and previously 3.6 by my measurements. This is currently Deauville 3 and was previously Deauville 3 disease. 2. Scattered physiologic activity in bowel. 3. Aortic Atherosclerosis (ICD10-I70.0). Coronary atherosclerosis with mild cardiomegaly. Electronically Signed   By: Van Clines M.D.   On: 08/28/2017 11:53     Assessment and plan-  Patient is a 77 y.o. male with stage II diffuse large B-cell lymphoma GCB status post 6 cycles of R CHOP chemotherapy followed by consolidative RT. He is here for routine f/u of his lymphoma  1.  DLBCL: I have reviewed CT images independently and discussed findings with the patient.  Patient completed his treatment in October 2018 and then following that he has had PET CT scans in November April and now in July 2019.  The SUV uptake in his mesenteric mass has decreased significantly since presentation.  However the 7 cm mesenteric mass persists and is currently reported to be adorable 3 which has remained unchanged over the last 3 PET scans.  I will again discuss with radiology if it is possible to biopsy the mesenteric mass to rule out any residual lymphoma.  It has been close to 10 months the patient has not received any systemic chemotherapy for his lymphoma and certainly lymphoma has not grown in the last 3 PET scans.  I therefore feel that we are not dealing with residual disease here.  Patient likely just has the residue leg mass which is an inactive lymphoma.  If it is possible to biopsy we will proceed with the biopsy at this time.  Otherwise I will plan to get a repeat CT chest abdomen and pelvis with contrast in 6 months time since he is now 1 year out of his treatment.  I will see him back in 3 months time with CBC CMP and LDH  2.  Prostate cancer with biochemical recurrence: PSA from today is pending.  After radiation and 1 dose of Lupron his PSA did go down from 0.96-0.01.  We will continue to monitor his PSA every 3 months.     Visit Diagnosis 1. Diffuse large B-cell lymphoma of intra-abdominal lymph nodes ([place])   2. Malignant neoplasm of prostate ([place])   3. Encounter for follow-up surveillance of diffuse large B-cell lymphoma      Dr. Randa Evens, MD, MPH [facility] [phone] 09/02/2017 8:12 AM

## 2017-09-04 ENCOUNTER — Encounter
Admission: RE | Disposition: A | Discharge status: Home / Self Care | Payer: Self-pay | Source: Ambulatory Visit | Attending: Gastroenterology

## 2017-09-04 ENCOUNTER — Ambulatory Visit
Admission: RE | Admit: 2017-09-04 | Discharge: 2017-09-04 | Disposition: A | Discharge status: Home / Self Care | Payer: Medicare HMO | Source: Ambulatory Visit | Attending: Gastroenterology | Admitting: Gastroenterology

## 2017-09-04 ENCOUNTER — Ambulatory Visit: Payer: Medicare HMO | Admitting: Anesthesiology

## 2017-09-04 ENCOUNTER — Encounter: Payer: Self-pay | Admitting: Anesthesiology

## 2017-09-04 DIAGNOSIS — Z86010 Personal history of colonic polyps: Secondary | ICD-10-CM | POA: Insufficient documentation

## 2017-09-04 DIAGNOSIS — E1122 Type 2 diabetes mellitus with diabetic chronic kidney disease: Secondary | ICD-10-CM | POA: Diagnosis not present

## 2017-09-04 DIAGNOSIS — Z7984 Long term (current) use of oral hypoglycemic drugs: Secondary | ICD-10-CM | POA: Insufficient documentation

## 2017-09-04 DIAGNOSIS — Z87891 Personal history of nicotine dependence: Secondary | ICD-10-CM | POA: Insufficient documentation

## 2017-09-04 DIAGNOSIS — Z79899 Other long term (current) drug therapy: Secondary | ICD-10-CM | POA: Diagnosis not present

## 2017-09-04 DIAGNOSIS — E785 Hyperlipidemia, unspecified: Secondary | ICD-10-CM | POA: Diagnosis not present

## 2017-09-04 DIAGNOSIS — K635 Polyp of colon: Secondary | ICD-10-CM | POA: Diagnosis not present

## 2017-09-04 DIAGNOSIS — Z980 Intestinal bypass and anastomosis status: Secondary | ICD-10-CM | POA: Diagnosis not present

## 2017-09-04 DIAGNOSIS — K5790 Diverticulosis of intestine, part unspecified, without perforation or abscess without bleeding: Secondary | ICD-10-CM | POA: Diagnosis not present

## 2017-09-04 DIAGNOSIS — K648 Other hemorrhoids: Secondary | ICD-10-CM | POA: Diagnosis not present

## 2017-09-04 DIAGNOSIS — Z7982 Long term (current) use of aspirin: Secondary | ICD-10-CM | POA: Insufficient documentation

## 2017-09-04 DIAGNOSIS — M1990 Unspecified osteoarthritis, unspecified site: Secondary | ICD-10-CM | POA: Insufficient documentation

## 2017-09-04 DIAGNOSIS — D123 Benign neoplasm of transverse colon: Secondary | ICD-10-CM | POA: Diagnosis not present

## 2017-09-04 DIAGNOSIS — I129 Hypertensive chronic kidney disease with stage 1 through stage 4 chronic kidney disease, or unspecified chronic kidney disease: Secondary | ICD-10-CM | POA: Insufficient documentation

## 2017-09-04 DIAGNOSIS — Z85038 Personal history of other malignant neoplasm of large intestine: Secondary | ICD-10-CM | POA: Insufficient documentation

## 2017-09-04 DIAGNOSIS — Z87442 Personal history of urinary calculi: Secondary | ICD-10-CM | POA: Insufficient documentation

## 2017-09-04 DIAGNOSIS — Z888 Allergy status to other drugs, medicaments and biological substances status: Secondary | ICD-10-CM | POA: Insufficient documentation

## 2017-09-04 DIAGNOSIS — K640 First degree hemorrhoids: Secondary | ICD-10-CM | POA: Diagnosis not present

## 2017-09-04 DIAGNOSIS — N183 Chronic kidney disease, stage 3 (moderate): Secondary | ICD-10-CM | POA: Diagnosis not present

## 2017-09-04 DIAGNOSIS — Z1211 Encounter for screening for malignant neoplasm of colon: Secondary | ICD-10-CM | POA: Insufficient documentation

## 2017-09-04 DIAGNOSIS — K5730 Diverticulosis of large intestine without perforation or abscess without bleeding: Secondary | ICD-10-CM | POA: Insufficient documentation

## 2017-09-04 DIAGNOSIS — Z8546 Personal history of malignant neoplasm of prostate: Secondary | ICD-10-CM | POA: Insufficient documentation

## 2017-09-04 DIAGNOSIS — K760 Fatty (change of) liver, not elsewhere classified: Secondary | ICD-10-CM | POA: Diagnosis not present

## 2017-09-04 DIAGNOSIS — Z85828 Personal history of other malignant neoplasm of skin: Secondary | ICD-10-CM | POA: Diagnosis not present

## 2017-09-04 DIAGNOSIS — D124 Benign neoplasm of descending colon: Secondary | ICD-10-CM | POA: Diagnosis not present

## 2017-09-04 HISTORY — DX: Personal history of other infectious and parasitic diseases: Z86.19

## 2017-09-04 HISTORY — PX: COLONOSCOPY WITH PROPOFOL: SHX5780

## 2017-09-04 LAB — CBC WITH DIFFERENTIAL/PLATELET
BASOS ABS: 0 10*3/uL (ref 0–0.1)
Basophils Relative: 1 %
Eosinophils Absolute: 0.2 10*3/uL (ref 0–0.7)
Eosinophils Relative: 4 %
HEMATOCRIT: 36.1 % — AB (ref 40.0–52.0)
Hemoglobin: 13 g/dL (ref 13.0–18.0)
LYMPHS ABS: 0.6 10*3/uL — AB (ref 1.0–3.6)
LYMPHS PCT: 13 %
MCH: 33.5 pg (ref 26.0–34.0)
MCHC: 35.9 g/dL (ref 32.0–36.0)
MCV: 93.2 fL (ref 80.0–100.0)
Monocytes Absolute: 0.6 10*3/uL (ref 0.2–1.0)
Monocytes Relative: 12 %
NEUTROS ABS: 3.4 10*3/uL (ref 1.4–6.5)
Neutrophils Relative %: 70 %
Platelets: 238 10*3/uL (ref 150–440)
RBC: 3.87 MIL/uL — ABNORMAL LOW (ref 4.40–5.90)
RDW: 13.1 % (ref 11.5–14.5)
WBC: 4.8 10*3/uL (ref 3.8–10.6)

## 2017-09-04 LAB — GLUCOSE, CAPILLARY: Glucose-Capillary: 128 mg/dL — ABNORMAL HIGH (ref 70–99)

## 2017-09-04 SURGERY — COLONOSCOPY WITH PROPOFOL
Anesthesia: General

## 2017-09-04 MED ORDER — SODIUM CHLORIDE 0.9 % IV SOLN
INTRAVENOUS | Status: DC
  Administered 2017-09-04: 1000 mL via INTRAVENOUS

## 2017-09-04 MED ORDER — PROPOFOL 500 MG/50ML IV EMUL
INTRAVENOUS | Status: DC | PRN
  Administered 2017-09-04: 100 ug/kg/min via INTRAVENOUS

## 2017-09-04 MED ORDER — PROPOFOL 10 MG/ML IV BOLUS
INTRAVENOUS | Status: AC
  Filled 2017-09-04: qty 40

## 2017-09-04 MED ORDER — LIDOCAINE HCL (CARDIAC) PF 100 MG/5ML IV SOSY
PREFILLED_SYRINGE | INTRAVENOUS | Status: DC | PRN
  Administered 2017-09-04: 80 mg via INTRAVENOUS

## 2017-09-04 MED ORDER — PROPOFOL 10 MG/ML IV BOLUS
INTRAVENOUS | Status: DC | PRN
  Administered 2017-09-04: 80 mg via INTRAVENOUS

## 2017-09-04 NOTE — Anesthesia Post-op Follow-up Note (Signed)
Anesthesia QCDR form completed.        

## 2017-09-04 NOTE — Op Note (Addendum)
[facility] Patient Name: Christian Kelly Procedure Date: 09/04/2017 8:28 AM MRN: [id number] Account #: [id number] Date of Birth: 1941-01-26 Admit Type: Outpatient Age: 77 Room: [facility] ENDO ROOM 1 Gender: Male Note Status: Finalized Procedure:            Colonoscopy Indications:          Personal history of malignant neoplasm of the colon,                        Personal history of colonic polyps Providers:            Lollie Sails, MD Referring MD:         Christena Flake. Raechel Ache, MD (Referring MD) Medicines:            Monitored Anesthesia Care Complications:        No immediate complications. Procedure:            Pre-Anesthesia Assessment:                       - ASA Grade Assessment: III - A patient with severe                        systemic disease.                       After obtaining informed consent, the colonoscope was                        passed under direct vision. Throughout the procedure,                        the patient's blood pressure, pulse, and oxygen                        saturations were monitored continuously. The                        Colonoscope was introduced through the anus and                        advanced to the the terminal ileum. The colonoscopy was                        performed without difficulty. The patient tolerated the                        procedure well. The quality of the bowel preparation                        was good. Findings:      There was evidence of a prior end-to-end colo-colonic anastomosis at 18       cm proximal to the anus. This was patent and was characterized by       healthy appearing mucosa.      A 12 mm polyp was found in the hepatic flexure. The polyp was sessile.       The polyp was removed with a cold biopsy forceps. The polyp was removed       with a lift and cut technique using a cold snare. Resection and       retrieval were complete.  A few small-mouthed diverticula were  found in the sigmoid colon and       descending colon.      Non-bleeding internal hemorrhoids were found during retroflexion. The       hemorrhoids were small and Grade I (internal hemorrhoids that do not       prolapse).      The digital rectal exam was normal.      The terminal ileum appeared normal.      A 3 mm polyp was found in the descending colon. The polyp was sessile.       The polyp was removed with a cold snare. Resection and retrieval were       complete. Impression:           - Patent end-to-end colo-colonic anastomosis,                        characterized by healthy appearing mucosa.                       - One 12 mm polyp at the hepatic flexure, removed with                        a cold biopsy forceps and removed using lift and cut                        and a cold snare. Resected and retrieved.                       - Diverticulosis in the sigmoid colon and in the                        descending colon.                       - Non-bleeding internal hemorrhoids. Recommendation:       - Discharge patient to home.                       - Discharge patient to home.                       - Soft diet today, then advance as tolerated to advance                        diet as tolerated. Procedure Code(s):    --- Professional ---                       [phone], Colonoscopy, flexible; with removal of tumor(s),                        polyp(s), or other lesion(s) by snare technique CPT copyright 2017 American Medical Association. All rights reserved. The codes documented in this report are preliminary and upon coder review may  be revised to meet current compliance requirements. Lollie Sails, MD 09/04/2017 9:15:05 AM This report has been signed electronically. Number of Addenda: 0 Note Initiated On: 09/04/2017 8:28 AM Scope Withdrawal Time: 0 hours 8 minutes 29 seconds  Total Procedure Duration: 0 hours 21 minutes 25 seconds       [facility]

## 2017-09-04 NOTE — Transfer of Care (Signed)
Immediate Anesthesia Transfer of Care Note  Patient: Remijio Holleran  Procedure(s) Performed: COLONOSCOPY WITH PROPOFOL (N/A )  Patient Location: PACU  Anesthesia Type:General  Level of Consciousness: awake, alert  and oriented  Airway & Oxygen Therapy: Patient Spontanous Breathing and Patient connected to nasal cannula oxygen  Post-op Assessment: Report given to RN and Post -op Vital signs reviewed and stable  Post vital signs: Reviewed and stable  Last Vitals:  Vitals Value Taken Time  BP 118/82 09/04/2017  9:16 AM  Temp 36.1 C 09/04/2017  9:16 AM  Pulse 68 09/04/2017  9:23 AM  Resp 14 09/04/2017  9:23 AM  SpO2 99 % 09/04/2017  9:23 AM  Vitals shown include unvalidated device data.  Last Pain:  Vitals:   09/04/17 0916  TempSrc: Tympanic  PainSc: 0-No pain         Complications: No apparent anesthesia complications

## 2017-09-04 NOTE — Anesthesia Postprocedure Evaluation (Signed)
Anesthesia Post Note  Patient: Christian Kelly  Procedure(s) Performed: COLONOSCOPY WITH PROPOFOL (N/A )  Patient location during evaluation: Endoscopy Anesthesia Type: General Level of consciousness: awake and alert Pain management: pain level controlled Vital Signs Assessment: post-procedure vital signs reviewed and stable Respiratory status: spontaneous breathing, nonlabored ventilation, respiratory function stable and patient connected to nasal cannula oxygen Cardiovascular status: blood pressure returned to baseline and stable Postop Assessment: no apparent nausea or vomiting Anesthetic complications: no     Last Vitals:  Vitals:   09/04/17 0936 09/04/17 0946  BP: (!) 141/74 128/88  Pulse: 61 66  Resp: 15 20  Temp:    SpO2: 100% 100%    Last Pain:  Vitals:   09/04/17 0946  TempSrc:   PainSc: 0-No pain                 Martha Clan

## 2017-09-04 NOTE — H&P (Signed)
Outpatient short stay form Pre-procedure 09/04/2017 8:36 AM Lollie Sails MD  Primary Physician: Genene Churn, MD  Reason for visit: Colonoscopy  History of present illness: Patient is a 77 year old male presenting today as above.  He has a personal history of colon cancer over 10 years ago and a history of adenomatous colon polyps.  He tolerated his prep well.  He takes no aspirin or blood thinning agent.  He does have a history of a end-to-end anastomosis at about 17 cm.  No prescribed blood thinner.    Current Facility-Administered Medications:  .  0.9 %  sodium chloride infusion, , Intravenous, Continuous, Lollie Sails, MD, Last Rate: 20 mL/hr at 09/04/17 6578  Medications Prior to Admission  Medication Sig Dispense Refill Last Dose  . ACCU-CHEK FASTCLIX LANCETS MISC    Past Week at Unknown time  . aspirin EC 81 MG tablet Take 81 mg by mouth every other day.    Past Week at Unknown time  . atorvastatin (LIPITOR) 40 MG tablet Take 40 mg by mouth daily.   Past Week at Unknown time  . Blood Glucose Monitoring Suppl (TRUE METRIX AIR GLUCOSE METER) w/Device KIT    Past Week at Unknown time  . hydrochlorothiazide (HYDRODIURIL) 25 MG tablet Take 25 mg by mouth daily.   09/04/2017 at 0630  . lidocaine-prilocaine (EMLA) cream Apply to affected area once 30 g 3 Past Week at Unknown time  . lisinopril (PRINIVIL,ZESTRIL) 10 MG tablet    09/04/2017 at 0630  . LORazepam (ATIVAN) 0.5 MG tablet Take 1 tablet (0.5 mg total) by mouth every 6 (six) hours as needed (Nausea or vomiting). 30 tablet 0 Past Week at Unknown time  . metFORMIN (GLUCOPHAGE) 500 MG tablet Take 500 mg by mouth 2 (two) times daily with a meal.   Past Week at Unknown time  . Multiple Vitamin (MULTIVITAMIN) capsule Take 1 capsule by mouth daily.   Past Week at Unknown time  . omeprazole (PRILOSEC) 20 MG capsule TAKE 1 CAPSULE BY MOUTH ONCE DAILY 30 capsule 1 Past Week at Unknown time  . ondansetron (ZOFRAN) 8 MG tablet Take 1  tablet (8 mg total) by mouth 2 (two) times daily as needed for refractory nausea / vomiting. Start on day 3 after cyclophosphamide chemotherapy. 30 tablet 1 Past Week at Unknown time  . predniSONE (DELTASONE) 50 MG tablet Take 2 tablets (100 mg total) by mouth daily. Take on days 1-5 of each chemotherapy given every 3 weeks 30 tablet 0 Past Week at Unknown time  . prochlorperazine (COMPAZINE) 10 MG tablet Take 1 tablet (10 mg total) by mouth every 6 (six) hours as needed (Nausea or vomiting). 30 tablet 6 Past Week at Unknown time  . TRUE METRIX BLOOD GLUCOSE TEST test strip    Past Week at Unknown time     Allergies  Allergen Reactions  . Tape Rash     Past Medical History:  Diagnosis Date  . Arthritis   . Basal cell carcinoma 2008   forehead  . Cancer ([place])   . Chronic renal insufficiency, stage III (moderate) (HCC)   . Colon cancer ([place]) 1998  . Diabetes mellitus without complication ([place])    Controlled with diet only as of 11/01/2016  . H/O onychomycosis   . History of kidney stones   . Hyperlipemia   . Hypertension   . Lyme disease   . Prostate cancer [facility]) 1999    Review of systems:      Physical  Exam    Heart and lungs: Without rub or gallop, lungs are bilaterally clear.    HEENT: Normocephalic atraumatic eyes are anicteric    Other:    Pertinant exam for procedure: Soft nontender nondistended bowel sounds positive normoactive.    Planned proceedures: Colonoscopy and indicated procedures. I have discussed the risks benefits and complications of procedures to include not limited to bleeding, infection, perforation and the risk of sedation and the patient wishes to proceed.    Lollie Sails, MD Gastroenterology 09/04/2017  8:36 AM

## 2017-09-04 NOTE — Anesthesia Preprocedure Evaluation (Signed)
Anesthesia Evaluation  Patient identified by MRN, date of birth, ID band Patient awake    Reviewed: Allergy & Precautions, H&P , NPO status , Patient's Chart, lab work & pertinent test results, reviewed documented beta blocker date and time   History of Anesthesia Complications Negative for: history of anesthetic complications  Airway Mallampati: II  TM Distance: >3 FB Neck ROM: full    Dental  (+) Caps, Teeth Intact   Pulmonary neg shortness of breath, neg sleep apnea, neg COPD, neg recent URI, former smoker,           Cardiovascular Exercise Tolerance: Good hypertension, On Medications (-) angina(-) CAD, (-) Past MI, (-) Cardiac Stents and (-) CABG (-) dysrhythmias (-) Valvular Problems/Murmurs     Neuro/Psych negative neurological ROS  negative psych ROS   GI/Hepatic negative GI ROS, Fatty liver disease   Endo/Other  diabetes, Well Controlled, Oral Hypoglycemic Agents  Renal/GU CRFRenal disease  negative genitourinary   Musculoskeletal   Abdominal   Peds  Hematology negative hematology ROS (+)   Anesthesia Other Findings Past Medical History:   Cancer ([place])                                                 Basal cell carcinoma                                         Chronic renal insufficiency, stage III (modera*              Hypertension                                                 Colon cancer (HCC)                                           Lyme disease                                                 Hyperlipemia                                                 Prostate cancer (HCC)                                        Reproductive/Obstetrics negative OB ROS                             Anesthesia Physical  Anesthesia Plan  ASA: III  Anesthesia Plan: General   Post-op Pain Management:    Induction: Intravenous  PONV Risk Score and Plan: 2 and Propofol infusion and  TIVA  Airway Management Planned: Nasal Cannula  Additional Equipment:   Intra-op Plan:   Post-operative Plan:   Informed Consent: I have reviewed the patients History and Physical, chart, labs and discussed the procedure including the risks, benefits and alternatives for the proposed anesthesia with the patient or authorized representative who has indicated his/her understanding and acceptance.   Dental Advisory Given  Plan Discussed with: Anesthesiologist, CRNA and Surgeon  Anesthesia Plan Comments:         Anesthesia Quick Evaluation

## 2017-09-05 ENCOUNTER — Encounter: Payer: Self-pay | Admitting: Gastroenterology

## 2017-09-06 LAB — SURGICAL PATHOLOGY

## 2017-09-27 ENCOUNTER — Other Ambulatory Visit: Payer: Self-pay | Admitting: *Deleted

## 2017-10-16 ENCOUNTER — Inpatient Hospital Stay: Payer: Medicare HMO | Attending: Oncology

## 2017-10-16 DIAGNOSIS — C8333 Diffuse large B-cell lymphoma, intra-abdominal lymph nodes: Secondary | ICD-10-CM | POA: Diagnosis not present

## 2017-10-16 DIAGNOSIS — Z85038 Personal history of other malignant neoplasm of large intestine: Secondary | ICD-10-CM

## 2017-10-16 DIAGNOSIS — C61 Malignant neoplasm of prostate: Secondary | ICD-10-CM | POA: Diagnosis not present

## 2017-10-16 LAB — PSA: Prostatic Specific Antigen: 0.01 ng/mL (ref 0.00–4.00)

## 2017-10-16 MED ORDER — SODIUM CHLORIDE 0.9% FLUSH
10.0000 mL | Freq: Once | INTRAVENOUS | Status: AC
  Administered 2017-10-16: 10 mL via INTRAVENOUS
  Filled 2017-10-16: qty 10

## 2017-10-16 MED ORDER — HEPARIN SOD (PORK) LOCK FLUSH 100 UNIT/ML IV SOLN
500.0000 [IU] | Freq: Once | INTRAVENOUS | Status: AC
  Administered 2017-10-16: 500 [IU] via INTRAVENOUS

## 2017-10-19 ENCOUNTER — Other Ambulatory Visit: Payer: Self-pay

## 2017-10-19 ENCOUNTER — Ambulatory Visit
Admission: RE | Admit: 2017-10-19 | Discharge: 2017-10-19 | Disposition: A | Discharge status: Home / Self Care | Payer: Medicare HMO | Source: Ambulatory Visit | Attending: Radiation Oncology | Admitting: Radiation Oncology

## 2017-10-19 ENCOUNTER — Encounter: Payer: Self-pay | Admitting: Radiation Oncology

## 2017-10-19 VITALS — BP 135/76 | HR 73 | Temp 96.2°F | Resp 18 | Wt 189.3 lb

## 2017-10-19 DIAGNOSIS — Z9221 Personal history of antineoplastic chemotherapy: Secondary | ICD-10-CM | POA: Diagnosis not present

## 2017-10-19 DIAGNOSIS — Z8572 Personal history of non-Hodgkin lymphomas: Secondary | ICD-10-CM | POA: Insufficient documentation

## 2017-10-19 DIAGNOSIS — Z923 Personal history of irradiation: Secondary | ICD-10-CM | POA: Insufficient documentation

## 2017-10-19 DIAGNOSIS — C61 Malignant neoplasm of prostate: Secondary | ICD-10-CM | POA: Insufficient documentation

## 2017-10-19 NOTE — Progress Notes (Signed)
Radiation Oncology Follow up Note  Name: Christian Kelly   Date:   10/19/2017 MRN:  [id number] DOB: Jun 03, 1940    This 76 y.o. male presents to the clinic today for four-month follow-up status post salvage radiation therapy to his prostate fossa as well as prior treatment to his abdomen for diffuse large B-cell lymphoma status post R CHOP chemotherapy.  REFERRING PROVIDER: Ezequiel Kayser, MD  HPI: patient is a 77 year old malepreviously treated to his abdominal region for involved field diffuse large B-cell lymphoma status post R CHOP chemotherapy. He is now out 4 months also from I MRT radiation therapy to his prostatic fossa for salvage radiation therapy for biochemical failure after radical prostatectomy..patient is doing fairly well he does states every 3 weeks he has a bout of diarrhea may be diet related. He specifically denies any increased lower urinary tract symptoms such as urgency or frequency.his most recent PSA is 0.01.his most recent PET CT scan performed July shows smaller size of confluent central mesenteric adenopathy mildly improved with SUV of 2.8 which continues to be followed by medical oncology.  COMPLICATIONS OF TREATMENT: none  FOLLOW UP COMPLIANCE: keeps appointments   PHYSICAL EXAM:  BP 135/76 (BP Location: Right Arm, Patient Position: Sitting)   Pulse 73   Temp (!) 96.2 F (35.7 C) (Tympanic)   Resp 18   Wt 189 lb 4.2 oz (85.9 kg)   BMI 25.67 kg/m  Well-developed well-nourished patient in NAD. HEENT reveals PERLA, EOMI, discs not visualized.  Oral cavity is clear. No oral mucosal lesions are identified. Neck is clear without evidence of cervical or supraclavicular adenopathy. Lungs are clear to A&P. Cardiac examination is essentially unremarkable with regular rate and rhythm without murmur rub or thrill. Abdomen is benign with no organomegaly or masses noted. Motor sensory and DTR levels are equal and symmetric in the upper and lower extremities. Cranial nerves II  through XII are grossly intact. Proprioception is intact. No peripheral adenopathy or edema is identified. No motor or sensory levels are noted. Crude visual fields are within normal range.  RADIOLOGY RESULTS: PET CT scan is reviewed and compatible with the above-stated findings  PLAN: present time patient is under excellent biochemical control of his prostate cancer. Hisabdominal lymphoma is continuing to be monitored and may eventually be biopsied by medical oncology. He is having no symptoms at this time. Otherwise I'm please was overall progress. I've asked to see him back in 6 months for follow-up. He continues close follow-up care with medical oncology.  I would like to take this opportunity to thank you for allowing me to participate in the care of your patient.Noreene Filbert, MD

## 2017-11-30 ENCOUNTER — Inpatient Hospital Stay: Payer: Medicare HMO | Attending: Oncology | Admitting: Oncology

## 2017-11-30 ENCOUNTER — Encounter: Payer: Self-pay | Admitting: Oncology

## 2017-11-30 ENCOUNTER — Inpatient Hospital Stay: Payer: Medicare HMO

## 2017-11-30 VITALS — BP 113/71 | HR 71 | Temp 97.6°F | Resp 18 | Wt 196.4 lb

## 2017-11-30 DIAGNOSIS — C61 Malignant neoplasm of prostate: Secondary | ICD-10-CM | POA: Diagnosis not present

## 2017-11-30 DIAGNOSIS — Z8579 Personal history of other malignant neoplasms of lymphoid, hematopoietic and related tissues: Secondary | ICD-10-CM

## 2017-11-30 DIAGNOSIS — Z87891 Personal history of nicotine dependence: Secondary | ICD-10-CM | POA: Insufficient documentation

## 2017-11-30 DIAGNOSIS — C8333 Diffuse large B-cell lymphoma, intra-abdominal lymph nodes: Secondary | ICD-10-CM | POA: Insufficient documentation

## 2017-11-30 DIAGNOSIS — Z08 Encounter for follow-up examination after completed treatment for malignant neoplasm: Secondary | ICD-10-CM

## 2017-11-30 LAB — COMPREHENSIVE METABOLIC PANEL
ALK PHOS: 76 U/L (ref 38–126)
ALT: 26 U/L (ref 0–44)
AST: 23 U/L (ref 15–41)
Albumin: 3.7 g/dL (ref 3.5–5.0)
Anion gap: 8 (ref 5–15)
BILIRUBIN TOTAL: 0.6 mg/dL (ref 0.3–1.2)
BUN: 22 mg/dL (ref 8–23)
CO2: 29 mmol/L (ref 22–32)
Calcium: 9.2 mg/dL (ref 8.9–10.3)
Chloride: 104 mmol/L (ref 98–111)
Creatinine, Ser: 1.05 mg/dL (ref 0.61–1.24)
GFR calc Af Amer: >60 mL/min (ref >60)
GFR calc non Af Amer: >60 mL/min (ref >60)
GLUCOSE: 124 mg/dL — AB (ref 70–99)
POTASSIUM: 4 mmol/L (ref 3.5–5.1)
Sodium: 141 mmol/L (ref 135–145)
TOTAL PROTEIN: 6.2 g/dL — AB (ref 6.5–8.1)

## 2017-11-30 LAB — CBC WITH DIFFERENTIAL/PLATELET
ABS IMMATURE GRANULOCYTES: 0.03 10*3/uL (ref 0.00–0.07)
BASOS ABS: 0 10*3/uL (ref 0.0–0.1)
Basophils Relative: 1 %
Eosinophils Absolute: 0.3 10*3/uL (ref 0.0–0.5)
Eosinophils Relative: 4 %
HEMATOCRIT: 36.8 % — AB (ref 39.0–52.0)
HEMOGLOBIN: 12.2 g/dL — AB (ref 13.0–17.0)
Immature Granulocytes: 0 %
LYMPHS ABS: 0.9 10*3/uL (ref 0.7–4.0)
LYMPHS PCT: 13 %
MCH: 31.3 pg (ref 26.0–34.0)
MCHC: 33.2 g/dL (ref 30.0–36.0)
MCV: 94.4 fL (ref 80.0–100.0)
Monocytes Absolute: 0.8 10*3/uL (ref 0.1–1.0)
Monocytes Relative: 11 %
NEUTROS ABS: 5 10*3/uL (ref 1.7–7.7)
NRBC: 0 % (ref 0.0–0.2)
Neutrophils Relative %: 71 %
Platelets: 200 10*3/uL (ref 150–400)
RBC: 3.9 MIL/uL — AB (ref 4.22–5.81)
RDW: 12.7 % (ref 11.5–15.5)
WBC: 7 10*3/uL (ref 4.0–10.5)

## 2017-11-30 LAB — PSA: Prostatic Specific Antigen: <0.01 ng/mL (ref 0.00–4.00)

## 2017-11-30 LAB — LACTATE DEHYDROGENASE: LDH: 111 U/L (ref 98–192)

## 2017-11-30 NOTE — Progress Notes (Signed)
Pt in for follow up, denies any concerns or difficulties.   °

## 2017-12-01 NOTE — Progress Notes (Signed)
Hematology/Oncology Consult note [facility]  [phone] [phone]  Patient Care Team: Ezequiel Kayser, MD as PCP - General (Internal Medicine) Hollice Espy, MD as Consulting Physician (Urology)   Name of the patient: Christian Kelly  [id number]  November 22, 1940   Date of visit: 12/01/17  Diagnosis- 1. . 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 Stage II DLBCL GCB. FISH for double hit negative    Chief complaint/ Reason for visit-routine follow-up of dlbcl and prostate cancer  Heme/Onc history: 1. Patient is a 77 yr old male with a h/o prostate cancer diagnosed in 1999s/p radical prostatectomy. Prior to that he had colon cancer in 1998 s/o surgery and adjuvant chemotherapy in 1998. With regards to prostate cancer- surgery was done at [facility] in [place] and he was followed at [place] subsequently. Patient think his Gleasons score was 7 at diagnosis. He has not required any radiation therapy or ADT so far. Patient still spends 4-5 months at [place].   2. Dr. Raechel Ache has been monitoring his PSArecently and his recent trend of PSA has been as follows: psa was 0.33 in feb 2017 and 0.63 in April 2018. We have 1 psa from 2015 when it was 0.3  3. Patient was referred to Dr. Erlene Quan urology and Dr. Baruch Gouty for salvage radiation.   4. Dr. Erlene Quan ordered CT abdomen which showed: IMPRESSION: 1. Status post prostatectomy, without locally recurrent disease. 2. Extensive abdominal adenopathy. Given the clinical history, most likely related to metastatic prostate carcinoma. Lymphoma could look similar. 3. Coronary artery atherosclerosis. Aortic atherosclerosis. 4. Vague upper sacral sclerosis is felt unlikely to represent metastatic disease, given lack of correlate on yesterday's bone scan. Correlate with radiation therapy, as radiation induced necrosis could have this appearance. Recommend attention  on follow-up.  5. This was followed by PET/CT scan which showed: IMPRESSION: 1. Hypermetabolic gastrohepatic ligament, abdominal retroperitoneal and small bowel mesenteric adenopathy, most indicative of lymphoma. 2. Aortic atherosclerosis (ICD10-170.0). Coronary artery Calcification.  6. Patient underwent CT-guided biopsy of the mesenteric lymph node which showed:DIAGNOSIS:  A. LYMPH NODE, MESENTERIC; CT-GUIDED CORE BIOPSY:  - LARGE B-CELL LYMPHOMA, CD10 POSITIVE.   Comment:  There is a diffuse proliferation of large lymphocytes with irregular  nuclear contours, inconspicuous nucleoli, and scant cytoplasm. The flow  cytometry and IHC results are most consistent with diffuse large B-cell  lymphoma, not otherwise specified, germinal center B-cell type. However,  it is likely that Wilder for MYC, BCL2 and BCL6 gene rearrangements will  be ordered, so final classification will be based on the [place] result  7. Bone marrow biopsy was negative for lymphoma  8. 17% of nuclei were positive for BCL-2 rearrangement. 35% of nuclei had extra myc signals but no gene rearrangement for cmyc noted. This was consistent with evolved lymphoma or DLBCL. Not consistentwithwith double hit or double expressor  9. Interim scans after 3 cycles of RCHOP showed: IMPRESSION: 2.2 x 5.3 cm jejunal mesenteric nodal mass along the anterior mid abdomen, significantly improved.   10.PET/CT scan after 6 cycles of therapy showed mesenteric nodule mass was 5.9 x 2.3 cm with an SUV of 2.7. This was discussed at tumor board. Likelihood of this representing residual lymphoma is low given the low SUV uptake which has been similar to prior PET scan 3 months ago. Patient then received RT to his mesenteric mass  Interval history-he feels well.  Denies any fatigue, unintentional weight loss or drenching night sweats.  He is more energetic  and is back to hiking.  Denies any aches or pains anywhere  ECOG PS- 0 Pain  scale- 0   Review of systems- Review of Systems  Constitutional: Negative for chills, fever, malaise/fatigue and weight loss.  HENT: Negative for congestion, ear discharge and nosebleeds.   Eyes: Negative for blurred vision.  Respiratory: Negative for cough, hemoptysis, sputum production, shortness of breath and wheezing.   Cardiovascular: Negative for chest pain, palpitations, orthopnea and claudication.  Gastrointestinal: Negative for abdominal pain, blood in stool, constipation, diarrhea, heartburn, melena, nausea and vomiting.  Genitourinary: Negative for dysuria, flank pain, frequency, hematuria and urgency.  Musculoskeletal: Negative for back pain, joint pain and myalgias.  Skin: Negative for rash.  Neurological: Negative for dizziness, tingling, focal weakness, seizures, weakness and headaches.  Endo/Heme/Allergies: Does not bruise/bleed easily.  Psychiatric/Behavioral: Negative for depression and suicidal ideas. The patient does not have insomnia.       Allergies  Allergen Reactions  . Tape Rash     Past Medical History:  Diagnosis Date  . Arthritis   . Basal cell carcinoma 2008   forehead  . Cancer ([place])   . Chronic renal insufficiency, stage III (moderate) (HCC)   . Colon cancer ([place]) 1998  . Diabetes mellitus without complication ([place])    Controlled with diet only as of 11/01/2016  . H/O onychomycosis   . History of kidney stones   . Hyperlipemia   . Hypertension   . Lyme disease   . Prostate cancer ([place]) 1999     Past Surgical History:  Procedure Laterality Date  . COLON SURGERY  1998  . COLONOSCOPY    . COLONOSCOPY WITH PROPOFOL N/A 12/29/2014   Procedure: COLONOSCOPY WITH PROPOFOL;  Surgeon: Lollie Sails, MD;  Location: [facility] ENDOSCOPY;  Service: Endoscopy;  Laterality: N/A;  . COLONOSCOPY WITH PROPOFOL N/A 09/04/2017   Procedure: COLONOSCOPY WITH PROPOFOL;  Surgeon: Lollie Sails, MD;  Location[facility] ENDOSCOPY;  Service: Endoscopy;  Laterality:  N/A;  . EYE SURGERY    . FRACTURE SURGERY    . IR FLUORO GUIDE PORT INSERTION RIGHT  09/01/2016  . LITHOTRIPSY    . PROSTATECTOMY  1998  . TONSILLECTOMY      Social History   Socioeconomic History  . Marital status: Married    Spouse name: Not on file  . Number of children: Not on file  . Years of education: Not on file  . Highest education level: Not on file  Occupational History  . Not on file  Social Needs  . Financial resource strain: Not on file  . Food insecurity:    Worry: Not on file    Inability: Not on file  . Transportation needs:    Medical: Not on file    Non-medical: Not on file  Tobacco Use  . Smoking status: Former Smoker    Packs/day: 1.00    Years: 8.00    Pack years: 8.00    Types: Cigarettes    Last attempt to quit: 08/13/1974    Years since quitting: 43.3  . Smokeless tobacco: Former Systems developer    Types: Snuff  Substance and Sexual Activity  . Alcohol use: Yes    Comment: rarely-one or two beer  . Drug use: No  . Sexual activity: Not Currently  Lifestyle  . Physical activity:    Days per week: Not on file    Minutes per session: Not on file  . Stress: Not on file  Relationships  . Social connections:  Talks on phone: Not on file    Gets together: Not on file    Attends religious service: Not on file    Active member of club or organization: Not on file    Attends meetings of clubs or organizations: Not on file    Relationship status: Not on file  . Intimate partner violence:    Fear of current or ex partner: Not on file    Emotionally abused: Not on file    Physically abused: Not on file    Forced sexual activity: Not on file  Other Topics Concern  . Not on file  Social History Narrative  . Not on file    Family History  Problem Relation Age of Onset  . Coronary artery disease Mother   . Diabetes Mother   . Prostate cancer Father   . Pancreatitis Father   . Bladder Cancer Sister   . Diabetes Brother   . Diabetes Brother   .  Prostate cancer Brother   . Diabetes Brother   . Diabetes Maternal Grandmother      Current Outpatient Medications:  .  ACCU-CHEK FASTCLIX LANCETS MISC, , Disp: , Rfl:  .  atorvastatin (LIPITOR) 40 MG tablet, Take 40 mg by mouth daily., Disp: , Rfl:  .  Blood Glucose Monitoring Suppl (TRUE METRIX AIR GLUCOSE METER) w/Device KIT, , Disp: , Rfl:  .  hydrochlorothiazide (HYDRODIURIL) 25 MG tablet, Take 25 mg by mouth daily., Disp: , Rfl:  .  lisinopril (PRINIVIL,ZESTRIL) 10 MG tablet, , Disp: , Rfl:  .  metFORMIN (GLUCOPHAGE) 500 MG tablet, Take 500 mg by mouth 2 (two) times daily with a meal., Disp: , Rfl:  .  Multiple Vitamin (MULTIVITAMIN) capsule, Take 1 capsule by mouth daily., Disp: , Rfl:  .  TRUE METRIX BLOOD GLUCOSE TEST test strip, , Disp: , Rfl:  .  aspirin EC 81 MG tablet, Take 81 mg by mouth every other day. , Disp: , Rfl:  .  lidocaine-prilocaine (EMLA) cream, Apply to affected area once (Patient not taking: Reported on 11/30/2017), Disp: 30 g, Rfl: 3  Physical exam:  Vitals:   11/30/17 1420  BP: 113/71  Pulse: 71  Resp: 18  Temp: 97.6 F (36.4 C)  TempSrc: Tympanic  Weight: 196 lb 7 oz (89.1 kg)   Physical Exam  Constitutional: He is oriented to person, place, and time. He appears well-developed and well-nourished.  HENT:  Head: Normocephalic and atraumatic.  Eyes: Pupils are equal, round, and reactive to light. EOM are normal.  Neck: Normal range of motion.  Cardiovascular: Normal rate, regular rhythm and normal heart sounds.  Pulmonary/Chest: Effort normal and breath sounds normal.  Abdominal: Soft. Bowel sounds are normal.  Lymphadenopathy:  No palpable cervical, supraclavicular, axillary or inguinal adenopathy   Neurological: He is alert and oriented to person, place, and time.  Skin: Skin is warm and dry.     CMP Latest Ref Rng & Units 11/30/2017  Glucose 70 - 99 mg/dL 124(H)  BUN 8 - 23 mg/dL 22  Creatinine 0.61 - 1.24 mg/dL 1.05  Sodium 135 - 145  mmol/L 141  Potassium 3.5 - 5.1 mmol/L 4.0  Chloride 98 - 111 mmol/L 104  CO2 22 - 32 mmol/L 29  Calcium 8.9 - 10.3 mg/dL 9.2  Total Protein 6.5 - 8.1 g/dL 6.2(L)  Total Bilirubin 0.3 - 1.2 mg/dL 0.6  Alkaline Phos 38 - 126 U/L 76  AST 15 - 41 U/L 23  ALT 0 - 44  U/L 26   CBC Latest Ref Rng & Units 11/30/2017  WBC 4.0 - 10.5 K/uL 7.0  Hemoglobin 13.0 - 17.0 g/dL 12.2(L)  Hematocrit 39.0 - 52.0 % 36.8(L)  Platelets 150 - 400 K/uL 200      Assessment and plan- Patient is a 77 y.o. male with following issues:  1.  Stage II DLBCL status post 6 cycles of R-CHOP chemotherapy.  Clinically the is doing well and there is no evidence of recurrence on today's exam.  We are doing surveillance scans every 6 months up to 2 years given that his last PET scan reported a Deauville score of 3 but this is likely due to surrounding bowel activity.  There is no increase in the size of his residual mesenteric mass over a year.  I will plan to get repeat CT chest abdomen and pelvis with contrast in 3 months time.  I will see him thereafter with CBC CMP and LDH  2.  History of prostate cancer with biochemical recurrence.  He has had prostatectomy in the past and then had salvage radiation for biochemical recurrence last year.  He is doing well from that standpoint and his PSA currently is less than 0.01 area and continue to monitor   Visit Diagnosis 1. Malignant neoplasm of prostate ([place])   2. Encounter for follow-up surveillance of diffuse large B-cell lymphoma      Dr. Randa Evens, MD, MPH [facility] [phone] 12/01/2017 1:31 PM

## 2018-01-02 DIAGNOSIS — Z0000 Encounter for general adult medical examination without abnormal findings: Secondary | ICD-10-CM | POA: Diagnosis not present

## 2018-01-02 DIAGNOSIS — Z23 Encounter for immunization: Secondary | ICD-10-CM | POA: Diagnosis not present

## 2018-01-02 DIAGNOSIS — Z79899 Other long term (current) drug therapy: Secondary | ICD-10-CM | POA: Diagnosis not present

## 2018-01-02 DIAGNOSIS — C8333 Diffuse large B-cell lymphoma, intra-abdominal lymph nodes: Secondary | ICD-10-CM | POA: Diagnosis not present

## 2018-01-02 DIAGNOSIS — E1122 Type 2 diabetes mellitus with diabetic chronic kidney disease: Secondary | ICD-10-CM | POA: Diagnosis not present

## 2018-01-02 DIAGNOSIS — E1169 Type 2 diabetes mellitus with other specified complication: Secondary | ICD-10-CM | POA: Diagnosis not present

## 2018-01-02 DIAGNOSIS — E785 Hyperlipidemia, unspecified: Secondary | ICD-10-CM | POA: Diagnosis not present

## 2018-01-02 DIAGNOSIS — E1159 Type 2 diabetes mellitus with other circulatory complications: Secondary | ICD-10-CM | POA: Diagnosis not present

## 2018-01-02 DIAGNOSIS — N182 Chronic kidney disease, stage 2 (mild): Secondary | ICD-10-CM | POA: Diagnosis not present

## 2018-01-09 DIAGNOSIS — Z86018 Personal history of other benign neoplasm: Secondary | ICD-10-CM | POA: Diagnosis not present

## 2018-01-09 DIAGNOSIS — L578 Other skin changes due to chronic exposure to nonionizing radiation: Secondary | ICD-10-CM | POA: Diagnosis not present

## 2018-01-09 DIAGNOSIS — L570 Actinic keratosis: Secondary | ICD-10-CM | POA: Diagnosis not present

## 2018-01-09 DIAGNOSIS — Z85828 Personal history of other malignant neoplasm of skin: Secondary | ICD-10-CM | POA: Diagnosis not present

## 2018-01-09 DIAGNOSIS — Z1283 Encounter for screening for malignant neoplasm of skin: Secondary | ICD-10-CM | POA: Diagnosis not present

## 2018-01-11 ENCOUNTER — Inpatient Hospital Stay: Payer: Medicare HMO | Attending: Oncology

## 2018-01-11 DIAGNOSIS — Z452 Encounter for adjustment and management of vascular access device: Secondary | ICD-10-CM | POA: Diagnosis not present

## 2018-01-11 DIAGNOSIS — C8333 Diffuse large B-cell lymphoma, intra-abdominal lymph nodes: Secondary | ICD-10-CM | POA: Insufficient documentation

## 2018-01-11 DIAGNOSIS — Z95828 Presence of other vascular implants and grafts: Secondary | ICD-10-CM

## 2018-01-11 MED ORDER — HEPARIN SOD (PORK) LOCK FLUSH 100 UNIT/ML IV SOLN
INTRAVENOUS | Status: AC
  Filled 2018-01-11: qty 5

## 2018-01-11 MED ORDER — SODIUM CHLORIDE 0.9% FLUSH
10.0000 mL | Freq: Once | INTRAVENOUS | Status: AC
  Administered 2018-01-11: 10 mL via INTRAVENOUS
  Filled 2018-01-11: qty 10

## 2018-01-11 MED ORDER — HEPARIN SOD (PORK) LOCK FLUSH 100 UNIT/ML IV SOLN
500.0000 [IU] | Freq: Once | INTRAVENOUS | Status: AC
  Administered 2018-01-11: 500 [IU] via INTRAVENOUS

## 2018-03-01 ENCOUNTER — Inpatient Hospital Stay: Payer: Medicare HMO | Attending: Oncology

## 2018-03-01 ENCOUNTER — Other Ambulatory Visit: Payer: Self-pay

## 2018-03-01 ENCOUNTER — Ambulatory Visit
Admission: RE | Admit: 2018-03-01 | Discharge: 2018-03-01 | Disposition: A | Discharge status: Home / Self Care | Payer: Medicare HMO | Source: Ambulatory Visit | Attending: Oncology | Admitting: Oncology

## 2018-03-01 ENCOUNTER — Ambulatory Visit: Payer: Medicare HMO

## 2018-03-01 DIAGNOSIS — C61 Malignant neoplasm of prostate: Secondary | ICD-10-CM

## 2018-03-01 DIAGNOSIS — Z8579 Personal history of other malignant neoplasms of lymphoid, hematopoietic and related tissues: Secondary | ICD-10-CM

## 2018-03-01 DIAGNOSIS — Z87891 Personal history of nicotine dependence: Secondary | ICD-10-CM | POA: Diagnosis not present

## 2018-03-01 DIAGNOSIS — R918 Other nonspecific abnormal finding of lung field: Secondary | ICD-10-CM | POA: Diagnosis not present

## 2018-03-01 DIAGNOSIS — Z08 Encounter for follow-up examination after completed treatment for malignant neoplasm: Secondary | ICD-10-CM

## 2018-03-01 DIAGNOSIS — C8333 Diffuse large B-cell lymphoma, intra-abdominal lymph nodes: Secondary | ICD-10-CM | POA: Insufficient documentation

## 2018-03-01 DIAGNOSIS — Z95828 Presence of other vascular implants and grafts: Secondary | ICD-10-CM

## 2018-03-01 LAB — CBC WITH DIFFERENTIAL/PLATELET
Abs Immature Granulocytes: 0.03 10*3/uL (ref 0.00–0.07)
Basophils Absolute: 0 10*3/uL (ref 0.0–0.1)
Basophils Relative: 1 %
EOS PCT: 3 %
Eosinophils Absolute: 0.2 10*3/uL (ref 0.0–0.5)
HCT: 39.7 % (ref 39.0–52.0)
Hemoglobin: 13.1 g/dL (ref 13.0–17.0)
Immature Granulocytes: 1 %
Lymphocytes Relative: 16 %
Lymphs Abs: 1 10*3/uL (ref 0.7–4.0)
MCH: 30.5 pg (ref 26.0–34.0)
MCHC: 33 g/dL (ref 30.0–36.0)
MCV: 92.5 fL (ref 80.0–100.0)
MONO ABS: 0.7 10*3/uL (ref 0.1–1.0)
Monocytes Relative: 11 %
Neutro Abs: 4.1 10*3/uL (ref 1.7–7.7)
Neutrophils Relative %: 68 %
Platelets: 214 10*3/uL (ref 150–400)
RBC: 4.29 MIL/uL (ref 4.22–5.81)
RDW: 12.4 % (ref 11.5–15.5)
WBC: 6 10*3/uL (ref 4.0–10.5)
nRBC: 0 % (ref 0.0–0.2)

## 2018-03-01 LAB — COMPREHENSIVE METABOLIC PANEL
ALT: 26 U/L (ref 0–44)
AST: 25 U/L (ref 15–41)
Albumin: 4 g/dL (ref 3.5–5.0)
Alkaline Phosphatase: 80 U/L (ref 38–126)
Anion gap: 10 (ref 5–15)
BUN: 22 mg/dL (ref 8–23)
CO2: 27 mmol/L (ref 22–32)
Calcium: 8.9 mg/dL (ref 8.9–10.3)
Chloride: 102 mmol/L (ref 98–111)
Creatinine, Ser: 1.15 mg/dL (ref 0.61–1.24)
GFR calc Af Amer: >60 mL/min (ref >60)
GFR calc non Af Amer: >60 mL/min (ref >60)
Glucose, Bld: 152 mg/dL — ABNORMAL HIGH (ref 70–99)
Potassium: 3.9 mmol/L (ref 3.5–5.1)
Sodium: 139 mmol/L (ref 135–145)
TOTAL PROTEIN: 6.7 g/dL (ref 6.5–8.1)
Total Bilirubin: 0.9 mg/dL (ref 0.3–1.2)

## 2018-03-01 LAB — PSA: Prostatic Specific Antigen: 0.01 ng/mL (ref 0.00–4.00)

## 2018-03-01 LAB — LACTATE DEHYDROGENASE: LDH: 118 U/L (ref 98–192)

## 2018-03-01 MED ORDER — HEPARIN SOD (PORK) LOCK FLUSH 100 UNIT/ML IV SOLN
500.0000 [IU] | Freq: Once | INTRAVENOUS | Status: AC
  Administered 2018-03-01: 500 [IU] via INTRAVENOUS

## 2018-03-01 MED ORDER — SODIUM CHLORIDE 0.9% FLUSH
10.0000 mL | Freq: Once | INTRAVENOUS | Status: AC
  Administered 2018-03-01: 10 mL via INTRAVENOUS
  Filled 2018-03-01: qty 10

## 2018-03-01 MED ORDER — IOPAMIDOL (ISOVUE-300) INJECTION 61%
100.0000 mL | Freq: Once | INTRAVENOUS | Status: AC | PRN
  Administered 2018-03-01: 100 mL via INTRAVENOUS

## 2018-03-02 ENCOUNTER — Inpatient Hospital Stay (HOSPITAL_BASED_OUTPATIENT_CLINIC_OR_DEPARTMENT_OTHER): Payer: Medicare HMO | Admitting: Oncology

## 2018-03-02 ENCOUNTER — Encounter: Payer: Self-pay | Admitting: Oncology

## 2018-03-02 VITALS — BP 108/69 | HR 75 | Temp 97.6°F | Resp 18 | Ht 72.0 in | Wt 197.2 lb

## 2018-03-02 DIAGNOSIS — C8333 Diffuse large B-cell lymphoma, intra-abdominal lymph nodes: Secondary | ICD-10-CM

## 2018-03-02 DIAGNOSIS — Z8579 Personal history of other malignant neoplasms of lymphoid, hematopoietic and related tissues: Principal | ICD-10-CM

## 2018-03-02 DIAGNOSIS — C61 Malignant neoplasm of prostate: Secondary | ICD-10-CM | POA: Diagnosis not present

## 2018-03-02 DIAGNOSIS — Z08 Encounter for follow-up examination after completed treatment for malignant neoplasm: Secondary | ICD-10-CM

## 2018-03-02 DIAGNOSIS — Z87891 Personal history of nicotine dependence: Secondary | ICD-10-CM | POA: Diagnosis not present

## 2018-03-02 NOTE — Progress Notes (Signed)
Hematology/Oncology Consult note [facility]  Telephone:([phone] [phone]  Patient Care Team: Ezequiel Kayser, MD as PCP - General (Internal Medicine) Hollice Espy, MD as Consulting Physician (Urology)   Name of the patient: Christian Kelly  [id number]  October 08, 1940   Date of visit: 03/02/18  Diagnosis-  1.. 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 Stage II DLBCL GCB. FISH for double hit negative   Chief complaint/ Reason for visit-routine follow-up of DLBCL and prostate cancer  Heme/Onc history: 1. Patient is a 78yrold male with a h/o prostate cancer diagnosed in 1999s/p radical prostatectomy. Prior to that he had colon cancer in 1998 s/o surgery and adjuvant chemotherapy in 1998. With regards to [facility] cancer[facility]. Patient think his Gleasons score was 7 at diagnosis. He has not required any radiation therapy or ADT so far. [facility]   2. Dr. TRaechel Achehas been monitoring his PSArecently and his recent trend of PSA has been as follows: psa was 0.33 in feb 2017 and 0.63 in April 2018. We have 1 psa from 2015 when it was 0.3  3. Patient was referred to Dr. BErlene Quanurology and Dr. cBaruch Goutyfor salvage radiation.   4. Dr. BErlene Quanordered CT abdomen which showed: IMPRESSION: 1. Status post prostatectomy, without locally recurrent disease. 2. Extensive abdominal adenopathy. Given the clinical history, most likely related to metastatic prostate carcinoma. Lymphoma could look similar. 3. Coronary artery atherosclerosis. Aortic atherosclerosis. 4. Vague upper sacral sclerosis is felt unlikely to represent metastatic disease, given lack of correlate on yesterday's bone scan. Correlate with radiation therapy, as radiation induced necrosis could have this appearance. Recommend attention  on follow-up.  5. This was followed by PET/CT scan which showed: IMPRESSION: 1. Hypermetabolic gastrohepatic ligament, abdominal retroperitoneal and small bowel mesenteric adenopathy, most indicative of lymphoma. 2. Aortic atherosclerosis (ICD10-170.0). Coronary artery Calcification.  6. Patient underwent CT-guided biopsy of the mesenteric lymph node which showed:DIAGNOSIS:  A. LYMPH NODE, MESENTERIC; CT-GUIDED CORE BIOPSY:  - LARGE B-CELL LYMPHOMA, CD10 POSITIVE.   Comment:  There is a diffuse proliferation of large lymphocytes with irregular  nuclear contours, inconspicuous nucleoli, and scant cytoplasm. The flow  cytometry and IHC results are most consistent with diffuse large B-cell  lymphoma, not otherwise specified, germinal center B-cell type. However,  it is likely that [place] MYC, BCL2 and BCL6 gene rearrangements will  be ordered, so final classification will be based on the [place]  7. Bone marrow biopsy was negative for lymphoma  8. 17% of nuclei were positive for BCL-2 rearrangement. 35% of nuclei had extra myc signals but no gene rearrangement for cmyc noted. This was consistent with evolved lymphoma or DLBCL. Not consistentwithwith double hit or double expressor  9. Interim scans after 3 cycles of RCHOP showed: IMPRESSION: 2.2 x 5.3 cm jejunal mesenteric nodal mass along the anterior mid abdomen, significantly improved.   10.PET/CT scan after 6 cycles of therapy showed mesenteric nodule mass was 5.9 x 2.3 cm with an SUV of 2.7. This was discussed at tumor board. Likelihood of this representing residual lymphoma is low given the low SUV uptake which has been similar to prior PET scan 3 months ago. Patient then received RT to his mesenteric mass   Interval history-overall he feels well.  His appetite is good and he denies any unintentional weight loss fatigue or drenching night  sweats.  Denies any lumps or bumps anywhere  ECOG PS- 0 Pain scale-  0   Review of systems- Review of Systems  Constitutional: Negative for chills, fever, malaise/fatigue and weight loss.  HENT: Negative for congestion, ear discharge and nosebleeds.   Eyes: Negative for blurred vision.  Respiratory: Negative for cough, hemoptysis, sputum production, shortness of breath and wheezing.   Cardiovascular: Negative for chest pain, palpitations, orthopnea and claudication.  Gastrointestinal: Negative for abdominal pain, blood in stool, constipation, diarrhea, heartburn, melena, nausea and vomiting.  Genitourinary: Negative for dysuria, flank pain, frequency, hematuria and urgency.  Musculoskeletal: Negative for back pain, joint pain and myalgias.  Skin: Negative for rash.  Neurological: Negative for dizziness, tingling, focal weakness, seizures, weakness and headaches.  Endo/Heme/Allergies: Does not bruise/bleed easily.  Psychiatric/Behavioral: Negative for depression and suicidal ideas. The patient does not have insomnia.       Allergies  Allergen Reactions  . Tape Rash     Past Medical History:  Diagnosis Date  . Arthritis   . Basal cell carcinoma 2008   forehead  . Cancer ([place])   . Chronic renal insufficiency, stage III (moderate) (HCC)   . Colon cancer ([place]) 1998  . Diabetes mellitus without complication ([place])    Controlled with diet only as of 11/01/2016  . Diffuse large B cell lymphoma ([place])   . H/O onychomycosis   . History of kidney stones   . Hyperlipemia   . Hypertension   . Lyme disease   . Prostate cancer ([place]) 1999     Past Surgical History:  Procedure Laterality Date  . COLON SURGERY  1998  . COLONOSCOPY    . COLONOSCOPY WITH PROPOFOL N/A 12/29/2014   Procedure: COLONOSCOPY WITH PROPOFOL;  Surgeon: Lollie Sails, MD;  Location: [facility] ENDOSCOPY;  Service: Endoscopy;  Laterality: N/A;  . COLONOSCOPY WITH PROPOFOL N/A 09/04/2017   Procedure: COLONOSCOPY WITH PROPOFOL;  Surgeon: Lollie Sails, MD;  Location[facility] ENDOSCOPY;   Service: Endoscopy;  Laterality: N/A;  . EYE SURGERY    . FRACTURE SURGERY    . IR FLUORO GUIDE PORT INSERTION RIGHT  09/01/2016  . LITHOTRIPSY    . PROSTATECTOMY  1998  . TONSILLECTOMY      Social History   Socioeconomic History  . Marital status: Married    Spouse name: Not on file  . Number of children: Not on file  . Years of education: Not on file  . Highest education level: Not on file  Occupational History  . Not on file  Social Needs  . Financial resource strain: Not on file  . Food insecurity:    Worry: Not on file    Inability: Not on file  . Transportation needs:    Medical: Not on file    Non-medical: Not on file  Tobacco Use  . Smoking status: Former Smoker    Packs/day: 1.00    Years: 8.00    Pack years: 8.00    Types: Cigarettes    Last attempt to quit: 08/13/1974    Years since quitting: 43.5  . Smokeless tobacco: Former Systems developer    Types: Snuff  Substance and Sexual Activity  . Alcohol use: Yes    Comment: rarely-one or two beer  . Drug use: No  . Sexual activity: Not Currently  Lifestyle  . Physical activity:    Days per week: Not on file    Minutes per session: Not on file  . Stress: Not on file  Relationships  .  Social connections:    Talks on phone: Not on file    Gets together: Not on file    Attends religious service: Not on file    Active member of club or organization: Not on file    Attends meetings of clubs or organizations: Not on file    Relationship status: Not on file  . Intimate partner violence:    Fear of current or ex partner: Not on file    Emotionally abused: Not on file    Physically abused: Not on file    Forced sexual activity: Not on file  Other Topics Concern  . Not on file  Social History Narrative  . Not on file    Family History  Problem Relation Age of Onset  . Coronary artery disease Mother   . Diabetes Mother   . Prostate cancer Father   . Pancreatitis Father   . Bladder Cancer Sister   . Diabetes  Brother   . Diabetes Brother   . Prostate cancer Brother   . Diabetes Brother   . Diabetes Maternal Grandmother      Current Outpatient Medications:  .  ACCU-CHEK FASTCLIX LANCETS MISC, , Disp: , Rfl:  .  atorvastatin (LIPITOR) 40 MG tablet, Take 40 mg by mouth daily., Disp: , Rfl:  .  Biotin 1000 MCG CHEW, Chew 2 Doses by mouth daily., Disp: , Rfl:  .  Blood Glucose Monitoring Suppl (TRUE METRIX AIR GLUCOSE METER) w/Device KIT, , Disp: , Rfl:  .  hydrochlorothiazide (HYDRODIURIL) 25 MG tablet, Take 25 mg by mouth daily., Disp: , Rfl:  .  lisinopril (PRINIVIL,ZESTRIL) 10 MG tablet, Take 10 mg by mouth daily. , Disp: , Rfl:  .  metFORMIN (GLUCOPHAGE) 500 MG tablet, Take 500 mg by mouth 2 (two) times daily with a meal., Disp: , Rfl:  .  Multiple Vitamin (MULTIVITAMIN) capsule, Take 1 capsule by mouth daily., Disp: , Rfl:  .  TRUE METRIX BLOOD GLUCOSE TEST test strip, , Disp: , Rfl:   Physical exam:  Vitals:   03/02/18 1028  BP: 108/69  Pulse: 75  Resp: 18  Temp: 97.6 F (36.4 C)  TempSrc: Tympanic  Weight: 197 lb 3.2 oz (89.4 kg)  Height: 6' (1.829 m)   Physical Exam Constitutional:      General: He is not in acute distress. HENT:     Head: Normocephalic and atraumatic.  Eyes:     Pupils: Pupils are equal, round, and reactive to light.  Neck:     Musculoskeletal: Normal range of motion.  Cardiovascular:     Rate and Rhythm: Normal rate and regular rhythm.     Heart sounds: Normal heart sounds.  Pulmonary:     Effort: Pulmonary effort is normal.     Breath sounds: Normal breath sounds.  Abdominal:     General: Bowel sounds are normal. There is no distension.     Palpations: Abdomen is soft.     Tenderness: There is no abdominal tenderness.  Lymphadenopathy:     Comments: No palpable cervical, supraclavicular, axillary or inguinal adenopathy   Skin:    General: Skin is warm and dry.  Neurological:     Mental Status: He is alert and oriented to person, place, and  time.      CMP Latest Ref Rng & Units 03/01/2018  Glucose 70 - 99 mg/dL 152(H)  BUN 8 - 23 mg/dL 22  Creatinine 0.61 - 1.24 mg/dL 1.15  Sodium 135 - 145 mmol/L 139  Potassium 3.5 - 5.1 mmol/L 3.9  Chloride 98 - 111 mmol/L 102  CO2 22 - 32 mmol/L 27  Calcium 8.9 - 10.3 mg/dL 8.9  Total Protein 6.5 - 8.1 g/dL 6.7  Total Bilirubin 0.3 - 1.2 mg/dL 0.9  Alkaline Phos 38 - 126 U/L 80  AST 15 - 41 U/L 25  ALT 0 - 44 U/L 26   CBC Latest Ref Rng & Units 03/01/2018  WBC 4.0 - 10.5 K/uL 6.0  Hemoglobin 13.0 - 17.0 g/dL 13.1  Hematocrit 39.0 - 52.0 % 39.7  Platelets 150 - 400 K/uL 214    No images are attached to the encounter.  Ct Chest W Contrast  Result Date: 03/01/2018 CLINICAL DATA:  Malignant neoplasm of prostate and history of lymphoma. EXAM: CT CHEST, ABDOMEN, AND PELVIS WITH CONTRAST TECHNIQUE: Multidetector CT imaging of the chest, abdomen and pelvis was performed following the standard protocol during bolus administration of intravenous contrast. CONTRAST:  172m ISOVUE-300 IOPAMIDOL (ISOVUE-300) INJECTION 61% COMPARISON:  PET 08/28/2017 FINDINGS: CT CHEST FINDINGS Cardiovascular: Aortic and branch vessel atherosclerosis. Tortuous thoracic aorta. Normal heart size, without pericardial effusion. Lad coronary artery calcification. No central pulmonary embolism, on this non-dedicated study. Right Port-A-Cath tip at high right atrium. Mediastinum/Nodes: No supraclavicular adenopathy. No mediastinal or hilar adenopathy. Lungs/Pleura: No pleural fluid. Nodule on the right minor fissure is unchanged and likely a subpleural lymph node. A nodule along the superior aspect of the right major fissure of 3 mm is also felt to be present on 09/14/2016 and represent a subpleural lymph node. Stable left lower and left upper lobe subpleural 2 mm pulmonary nodules are consistent with benign subpleural lymph nodes. Musculoskeletal: No acute osseous abnormality. CT ABDOMEN PELVIS FINDINGS Hepatobiliary: Normal  liver. Normal gallbladder, without biliary ductal dilatation. Pancreas: Normal, without mass or ductal dilatation. Spleen: Normal in size, without focal abnormality. Adrenals/Urinary Tract: Normal adrenal glands. Upper and interpolar left renal cysts. Interpolar right renal too small to characterize lesion. Left renal sinus cysts, without hydronephrosis. Apparent mild bladder wall thickening is favored to be due to underdistention. Stomach/Bowel: Normal stomach, without wall thickening. Surgical sutures in the sigmoid. Normal terminal ileum and appendix. Normal small bowel. Vascular/Lymphatic: Advanced aortic and branch vessel atherosclerosis. Small abdominal retroperitoneal nodes are similar and not pathologic by size criteria. Confluent soft tissue density within the small bowel mesentery measures 6.6 x 2.1 cm on image 83/2. Slightly decreased and less masslike compared to 7.6 x 2.2 cm on the prior PET. Pelvic node dissection, without sidewall adenopathy. Reproductive: Prostatectomy, without locally recurrent disease. Other: No significant free fluid. Musculoskeletal: Degenerative partial fusion of the left sacroiliac joint. Heterogeneous density throughout the sacrum, with vague sclerosis superior and relative lucency inferiorly. Similar on sagittal image 88 when compared to 07/20/2016. Possibly related to radiation induced necrosis. IMPRESSION: 1. Decreased size of infiltrative nodal tissue within the small bowel mesentery. 2. Otherwise, no evidence of active lymphoma. 3. Prostatectomy, without findings of metastatic disease. 4. Coronary artery atherosclerosis. Aortic Atherosclerosis (ICD10-I70.0). 5. Pulmonary nodules which are felt to be similar and likely subpleural lymph nodes. Electronically Signed   By: KAbigail MiyamotoM.D.   On: 03/01/2018 13:49   Ct Abdomen Pelvis W Contrast  Result Date: 03/01/2018 CLINICAL DATA:  Malignant neoplasm of prostate and history of lymphoma. EXAM: CT CHEST, ABDOMEN, AND  PELVIS WITH CONTRAST TECHNIQUE: Multidetector CT imaging of the chest, abdomen and pelvis was performed following the standard protocol during bolus administration of intravenous contrast. CONTRAST:  1050mISOVUE-300 IOPAMIDOL (  ISOVUE-300) INJECTION 61% COMPARISON:  PET 08/28/2017 FINDINGS: CT CHEST FINDINGS Cardiovascular: Aortic and branch vessel atherosclerosis. Tortuous thoracic aorta. Normal heart size, without pericardial effusion. Lad coronary artery calcification. No central pulmonary embolism, on this non-dedicated study. Right Port-A-Cath tip at high right atrium. Mediastinum/Nodes: No supraclavicular adenopathy. No mediastinal or hilar adenopathy. Lungs/Pleura: No pleural fluid. Nodule on the right minor fissure is unchanged and likely a subpleural lymph node. A nodule along the superior aspect of the right major fissure of 3 mm is also felt to be present on 09/14/2016 and represent a subpleural lymph node. Stable left lower and left upper lobe subpleural 2 mm pulmonary nodules are consistent with benign subpleural lymph nodes. Musculoskeletal: No acute osseous abnormality. CT ABDOMEN PELVIS FINDINGS Hepatobiliary: Normal liver. Normal gallbladder, without biliary ductal dilatation. Pancreas: Normal, without mass or ductal dilatation. Spleen: Normal in size, without focal abnormality. Adrenals/Urinary Tract: Normal adrenal glands. Upper and interpolar left renal cysts. Interpolar right renal too small to characterize lesion. Left renal sinus cysts, without hydronephrosis. Apparent mild bladder wall thickening is favored to be due to underdistention. Stomach/Bowel: Normal stomach, without wall thickening. Surgical sutures in the sigmoid. Normal terminal ileum and appendix. Normal small bowel. Vascular/Lymphatic: Advanced aortic and branch vessel atherosclerosis. Small abdominal retroperitoneal nodes are similar and not pathologic by size criteria. Confluent soft tissue density within the small bowel  mesentery measures 6.6 x 2.1 cm on image 83/2. Slightly decreased and less masslike compared to 7.6 x 2.2 cm on the prior PET. Pelvic node dissection, without sidewall adenopathy. Reproductive: Prostatectomy, without locally recurrent disease. Other: No significant free fluid. Musculoskeletal: Degenerative partial fusion of the left sacroiliac joint. Heterogeneous density throughout the sacrum, with vague sclerosis superior and relative lucency inferiorly. Similar on sagittal image 88 when compared to 07/20/2016. Possibly related to radiation induced necrosis. IMPRESSION: 1. Decreased size of infiltrative nodal tissue within the small bowel mesentery. 2. Otherwise, no evidence of active lymphoma. 3. Prostatectomy, without findings of metastatic disease. 4. Coronary artery atherosclerosis. Aortic Atherosclerosis (ICD10-I70.0). 5. Pulmonary nodules which are felt to be similar and likely subpleural lymph nodes. Electronically Signed   By: Abigail Miyamoto M.D.   On: 03/01/2018 13:49     Assessment and plan- Patient is a 78 y.o. male with following issues:  1.  Stage II DLBCL status post 6 cycles of R-CHOP chemotherapy followed by radiation: Clinically patient is doing well and there is no evidence of recurrence on today's exam.  Repeat CT chest abdomen pelvis done yesterday showed residual mass in his mesentery which has been present since the time he is completed treatment which likely represents residual necrotic mass but no active lymphoma.  This has not grown in size since he completed treatment in August 2018 and likely to go away.  I will get his last CT scan 6 months from now which would mark roughly 2 years of completion of his chemotherapy and from that point onwards he would only require H&P and labs.  I will see him back in 3 months time with a CBC CMP and LDH  We will plan to take out his port at this time  2.  History of prostate cancer with biochemical recurrence: Patient had prostatectomy in the  past and then recently when his PSA started coming up he had salvage radiation.  His PSA continues to be normal at 0.01.  We will repeat levels in 6 months   Visit Diagnosis 1. Encounter for follow-up surveillance of diffuse large B-cell lymphoma  2. Prostate cancer [facility])      Dr. Randa Evens, MD, MPH [facility] [phone] 03/02/2018 2:51 PM

## 2018-03-02 NOTE — Progress Notes (Signed)
No concerns, just here to get labs and imaging results

## 2018-03-04 ENCOUNTER — Other Ambulatory Visit: Payer: Self-pay | Admitting: *Deleted

## 2018-03-04 DIAGNOSIS — C8333 Diffuse large B-cell lymphoma, intra-abdominal lymph nodes: Secondary | ICD-10-CM

## 2018-03-04 NOTE — Progress Notes (Unsigned)
img 

## 2018-03-16 ENCOUNTER — Telehealth: Payer: Self-pay | Admitting: *Deleted

## 2018-03-16 NOTE — Telephone Encounter (Signed)
Called the patient's home and spoke to wife.  Splane that we did get an appointment for him to have his port removal on February 5 [facility] 12 noon for a 1 PM procedure.  Asked him to be n.p.o. after midnight the night before procedure.  Any meds with a sip of water only.  And must have a driver to take him there and bring him back because of the conscious sedation that he would have for the port to be removed.  I am they should be at the hospital is 2 to 4 hours.  Wife agreeable and is put it on the calendar and will make it to the appointment.

## 2018-03-27 ENCOUNTER — Other Ambulatory Visit: Payer: Self-pay | Admitting: Radiology

## 2018-03-28 ENCOUNTER — Ambulatory Visit
Admission: RE | Admit: 2018-03-28 | Discharge: 2018-03-28 | Disposition: A | Discharge status: Home / Self Care | Payer: Medicare HMO | Source: Ambulatory Visit | Attending: Oncology | Admitting: Oncology

## 2018-03-28 ENCOUNTER — Other Ambulatory Visit: Payer: Self-pay

## 2018-03-28 DIAGNOSIS — Z8249 Family history of ischemic heart disease and other diseases of the circulatory system: Secondary | ICD-10-CM | POA: Insufficient documentation

## 2018-03-28 DIAGNOSIS — Z7984 Long term (current) use of oral hypoglycemic drugs: Secondary | ICD-10-CM | POA: Diagnosis not present

## 2018-03-28 DIAGNOSIS — Z8052 Family history of malignant neoplasm of bladder: Secondary | ICD-10-CM | POA: Insufficient documentation

## 2018-03-28 DIAGNOSIS — Z9079 Acquired absence of other genital organ(s): Secondary | ICD-10-CM | POA: Insufficient documentation

## 2018-03-28 DIAGNOSIS — E1122 Type 2 diabetes mellitus with diabetic chronic kidney disease: Secondary | ICD-10-CM | POA: Insufficient documentation

## 2018-03-28 DIAGNOSIS — Z79899 Other long term (current) drug therapy: Secondary | ICD-10-CM | POA: Diagnosis not present

## 2018-03-28 DIAGNOSIS — C8330 Diffuse large B-cell lymphoma, unspecified site: Secondary | ICD-10-CM | POA: Diagnosis not present

## 2018-03-28 DIAGNOSIS — Z833 Family history of diabetes mellitus: Secondary | ICD-10-CM | POA: Insufficient documentation

## 2018-03-28 DIAGNOSIS — Z452 Encounter for adjustment and management of vascular access device: Secondary | ICD-10-CM | POA: Diagnosis not present

## 2018-03-28 DIAGNOSIS — C8333 Diffuse large B-cell lymphoma, intra-abdominal lymph nodes: Secondary | ICD-10-CM | POA: Diagnosis not present

## 2018-03-28 DIAGNOSIS — E785 Hyperlipidemia, unspecified: Secondary | ICD-10-CM | POA: Insufficient documentation

## 2018-03-28 DIAGNOSIS — I129 Hypertensive chronic kidney disease with stage 1 through stage 4 chronic kidney disease, or unspecified chronic kidney disease: Secondary | ICD-10-CM | POA: Diagnosis not present

## 2018-03-28 DIAGNOSIS — Z8042 Family history of malignant neoplasm of prostate: Secondary | ICD-10-CM | POA: Insufficient documentation

## 2018-03-28 DIAGNOSIS — Z8546 Personal history of malignant neoplasm of prostate: Secondary | ICD-10-CM | POA: Diagnosis not present

## 2018-03-28 DIAGNOSIS — Z87891 Personal history of nicotine dependence: Secondary | ICD-10-CM | POA: Insufficient documentation

## 2018-03-28 DIAGNOSIS — Z85038 Personal history of other malignant neoplasm of large intestine: Secondary | ICD-10-CM | POA: Diagnosis not present

## 2018-03-28 DIAGNOSIS — N183 Chronic kidney disease, stage 3 (moderate): Secondary | ICD-10-CM | POA: Diagnosis not present

## 2018-03-28 HISTORY — PX: IR REMOVAL TUN ACCESS W/ PORT W/O FL MOD SED: IMG2290

## 2018-03-28 LAB — BASIC METABOLIC PANEL
Anion gap: 8 (ref 5–15)
BUN: 20 mg/dL (ref 8–23)
CO2: 28 mmol/L (ref 22–32)
CREATININE: 1.04 mg/dL (ref 0.61–1.24)
Calcium: 9.1 mg/dL (ref 8.9–10.3)
Chloride: 103 mmol/L (ref 98–111)
GFR calc Af Amer: >60 mL/min (ref >60)
GFR calc non Af Amer: >60 mL/min (ref >60)
Glucose, Bld: 121 mg/dL — ABNORMAL HIGH (ref 70–99)
Potassium: 3.6 mmol/L (ref 3.5–5.1)
Sodium: 139 mmol/L (ref 135–145)

## 2018-03-28 LAB — CBC
HCT: 41 % (ref 39.0–52.0)
Hemoglobin: 13.7 g/dL (ref 13.0–17.0)
MCH: 31.1 pg (ref 26.0–34.0)
MCHC: 33.4 g/dL (ref 30.0–36.0)
MCV: 93.2 fL (ref 80.0–100.0)
Platelets: 216 10*3/uL (ref 150–400)
RBC: 4.4 MIL/uL (ref 4.22–5.81)
RDW: 12.2 % (ref 11.5–15.5)
WBC: 7.5 10*3/uL (ref 4.0–10.5)
nRBC: 0 % (ref 0.0–0.2)

## 2018-03-28 LAB — PROTIME-INR
INR: 1.01
Prothrombin Time: 13.2 seconds (ref 11.4–15.2)

## 2018-03-28 LAB — GLUCOSE, CAPILLARY: Glucose-Capillary: 110 mg/dL — ABNORMAL HIGH (ref 70–99)

## 2018-03-28 MED ORDER — MIDAZOLAM HCL 2 MG/2ML IJ SOLN
INTRAMUSCULAR | Status: AC | PRN
  Administered 2018-03-28: 1 mg via INTRAVENOUS

## 2018-03-28 MED ORDER — MIDAZOLAM HCL 2 MG/2ML IJ SOLN
INTRAMUSCULAR | Status: AC
  Filled 2018-03-28: qty 2

## 2018-03-28 MED ORDER — LIDOCAINE-EPINEPHRINE (PF) 1 %-1:200000 IJ SOLN
INTRAMUSCULAR | Status: AC
  Filled 2018-03-28: qty 30

## 2018-03-28 MED ORDER — FENTANYL CITRATE (PF) 100 MCG/2ML IJ SOLN
INTRAMUSCULAR | Status: AC | PRN
  Administered 2018-03-28: 50 ug via INTRAVENOUS

## 2018-03-28 MED ORDER — FENTANYL CITRATE (PF) 100 MCG/2ML IJ SOLN
INTRAMUSCULAR | Status: AC
  Filled 2018-03-28: qty 2

## 2018-03-28 MED ORDER — SODIUM CHLORIDE 0.9 % IV SOLN: INTRAVENOUS | Status: DC

## 2018-03-28 MED ORDER — CEFAZOLIN SODIUM-DEXTROSE 2-4 GM/100ML-% IV SOLN
INTRAVENOUS | Status: AC
  Filled 2018-03-28: qty 100

## 2018-03-28 MED ORDER — CEFAZOLIN SODIUM-DEXTROSE 2-4 GM/100ML-% IV SOLN
2.0000 g | INTRAVENOUS | Status: AC
  Administered 2018-03-28: 2 g via INTRAVENOUS

## 2018-03-28 NOTE — Procedures (Signed)
Pre Procedural Dx: Completed chemotherapy, no longer in need of port-a-catheter Post Procedural Dx: Same  Successful removal of anterior chest wall port-a-cath.  EBL: Minimal  No immediate post procedural complications.   Ronny Bacon, MD Pager #: [phone]

## 2018-03-28 NOTE — Consult Note (Signed)
Chief Complaint: Completed chemotherapy, no longer in need of Port-A-Cath   Referring Physician(s): Rao,Archana C  Patient Status: [facility] - Out-pt  History of Present Illness: Christian Kelly is a 78 y.o. male with past medical history significant for prostate cancer, hypertension, hyperlipidemia, nephrolithiasis, diabetes, chronic renal insufficiency, colon cancer and most recently, diffuse large B-cell lymphoma who has completed chemotherapy and is no longer in need of portacatheter.  The portacatheter was placed by myself on 09/01/2016 and has function well throughout duration of his treatment course.  There is no clinical sign or concern of infection.  Patient is currently without complaint.  Specifically, no chest pain, shortness of breath, fever or chills.  Past Medical History:  Diagnosis Date  . Arthritis   . Basal cell carcinoma 2008   forehead  . Cancer ([place])   . Chronic renal insufficiency, stage III (moderate) (HCC)   . Colon cancer ([place]) 1998  . Diabetes mellitus without complication ([place])    Controlled with diet only as of 11/01/2016  . Diffuse large B cell lymphoma ([place])   . H/O onychomycosis   . History of kidney stones   . Hyperlipemia   . Hypertension   . Lyme disease   . Prostate cancer ([place]) 1999    Past Surgical History:  Procedure Laterality Date  . COLON SURGERY  1998  . COLONOSCOPY    . COLONOSCOPY WITH PROPOFOL N/A 12/29/2014   Procedure: COLONOSCOPY WITH PROPOFOL;  Surgeon: Lollie Sails, MD;  Location: [facility] ENDOSCOPY;  Service: Endoscopy;  Laterality: N/A;  . COLONOSCOPY WITH PROPOFOL N/A 09/04/2017   Procedure: COLONOSCOPY WITH PROPOFOL;  Surgeon: Lollie Sails, MD;  Location: [facility] ENDOSCOPY;  Service: Endoscopy;  Laterality: N/A;  . EYE SURGERY    . FRACTURE SURGERY    . IR FLUORO GUIDE PORT INSERTION RIGHT  09/01/2016  . LITHOTRIPSY    . PROSTATECTOMY  1998  . TONSILLECTOMY      Allergies: Tape  Medications: Prior to Admission  medications   Medication Sig Start Date End Date Taking? Authorizing Provider  atorvastatin (LIPITOR) 40 MG tablet Take 40 mg by mouth daily.   Yes [provider]  Biotin 1000 MCG CHEW Chew 2 Doses by mouth daily.   Yes [provider]  hydrochlorothiazide (HYDRODIURIL) 25 MG tablet Take 25 mg by mouth daily.   Yes [provider]  lisinopril (PRINIVIL,ZESTRIL) 10 MG tablet Take 10 mg by mouth daily.  01/18/17  Yes [provider]  Multiple Vitamin (MULTIVITAMIN) capsule Take 1 capsule by mouth daily.   Yes [provider]  ACCU-CHEK FASTCLIX LANCETS [place]  09/15/16   [provider]  Blood Glucose Monitoring Suppl (TRUE METRIX AIR GLUCOSE METER) w/Device KIT  09/29/16   [provider]  metFORMIN (GLUCOPHAGE) 500 MG tablet Take 500 mg by mouth 2 (two) times daily with a meal.    [provider]  TRUE METRIX BLOOD GLUCOSE TEST test strip  01/23/17   [provider]     Family History  Problem Relation Age of Onset  . Coronary artery disease Mother   . Diabetes Mother   . Prostate cancer Father   . Pancreatitis Father   . Bladder Cancer Sister   . Diabetes Brother   . Diabetes Brother   . Prostate cancer Brother   . Diabetes Brother   . Diabetes Maternal Grandmother     Social History   Socioeconomic History  . Marital status: Married    Spouse name:  Marcie Bal   . Number of children: 3  . Years of education: Not on file  . Highest education level: Not on file  Occupational History  . Occupation: retired     Comment: Tax adviser  Social Needs  . Financial resource strain: Not very hard  . Food insecurity:    Worry: Never true    Inability: Never true  . Transportation needs:    Medical: No    Non-medical: No  Tobacco Use  . Smoking status: Former Smoker    Packs/day: 1.00    Years: 8.00    Pack years: 8.00    Types: Cigarettes    Last attempt to quit: 08/13/1974    Years since quitting:  43.6  . Smokeless tobacco: Former Systems developer    Types: Snuff  Substance and Sexual Activity  . Alcohol use: Yes    Comment: rarely-one or two beer  . Drug use: No  . Sexual activity: Not Currently  Lifestyle  . Physical activity:    Days per week: 1 day    Minutes per session: 90 min  . Stress: Only a little  Relationships  . Social connections:    Talks on phone: More than three times a week    Gets together: Not on file    Attends religious service: More than 4 times per year    Active member of club or organization: Not on file    Attends meetings of clubs or organizations: Not on file    Relationship status: Married  Other Topics Concern  . Not on file  Social History Narrative  . Not on file    ECOG Status: 0 - Asymptomatic  Review of Systems: A 12 point ROS discussed and pertinent positives are indicated in the HPI above.  All other systems are negative.  Review of Systems  Constitutional: Negative for activity change, fatigue and fever.  Respiratory: Negative.   Cardiovascular: Negative.     Vital Signs: BP 136/74   Pulse 69   Temp 97.8 F (36.6 C) (Oral)   Ht 6' (1.829 m)   Wt 88.5 kg   SpO2 97%   BMI 26.45 kg/m   Physical Exam Vitals signs and nursing note reviewed.  Constitutional:      Appearance: Normal appearance.  HENT:     Head: Normocephalic and atraumatic.  Cardiovascular:     Rate and Rhythm: Normal rate and regular rhythm.     Heart sounds: Normal heart sounds.  Pulmonary:     Effort: Pulmonary effort is normal.     Breath sounds: Normal breath sounds.  Neurological:     Mental Status: He is alert.  Psychiatric:        Mood and Affect: Mood normal.        Behavior: Behavior normal.        Thought Content: Thought content normal.     Imaging: Ct Chest W Contrast  Result Date: 03/01/2018 CLINICAL DATA:  Malignant neoplasm of prostate and history of lymphoma. EXAM: CT CHEST, ABDOMEN, AND PELVIS WITH CONTRAST TECHNIQUE: Multidetector CT  imaging of the chest, abdomen and pelvis was performed following the standard protocol during bolus administration of intravenous contrast. CONTRAST:  138m ISOVUE-300 IOPAMIDOL (ISOVUE-300) INJECTION 61% COMPARISON:  PET 08/28/2017 FINDINGS: CT CHEST FINDINGS Cardiovascular: Aortic and branch vessel atherosclerosis. Tortuous thoracic aorta. Normal heart size, without pericardial effusion. Lad coronary artery calcification. No central pulmonary embolism, on this non-dedicated study. Right Port-A-Cath tip at high right atrium. Mediastinum/Nodes: No supraclavicular adenopathy.  No mediastinal or hilar adenopathy. Lungs/Pleura: No pleural fluid. Nodule on the right minor fissure is unchanged and likely a subpleural lymph node. A nodule along the superior aspect of the right major fissure of 3 mm is also felt to be present on 09/14/2016 and represent a subpleural lymph node. Stable left lower and left upper lobe subpleural 2 mm pulmonary nodules are consistent with benign subpleural lymph nodes. Musculoskeletal: No acute osseous abnormality. CT ABDOMEN PELVIS FINDINGS Hepatobiliary: Normal liver. Normal gallbladder, without biliary ductal dilatation. Pancreas: Normal, without mass or ductal dilatation. Spleen: Normal in size, without focal abnormality. Adrenals/Urinary Tract: Normal adrenal glands. Upper and interpolar left renal cysts. Interpolar right renal too small to characterize lesion. Left renal sinus cysts, without hydronephrosis. Apparent mild bladder wall thickening is favored to be due to underdistention. Stomach/Bowel: Normal stomach, without wall thickening. Surgical sutures in the sigmoid. Normal terminal ileum and appendix. Normal small bowel. Vascular/Lymphatic: Advanced aortic and branch vessel atherosclerosis. Small abdominal retroperitoneal nodes are similar and not pathologic by size criteria. Confluent soft tissue density within the small bowel mesentery measures 6.6 x 2.1 cm on image 83/2. Slightly  decreased and less masslike compared to 7.6 x 2.2 cm on the prior PET. Pelvic node dissection, without sidewall adenopathy. Reproductive: Prostatectomy, without locally recurrent disease. Other: No significant free fluid. Musculoskeletal: Degenerative partial fusion of the left sacroiliac joint. Heterogeneous density throughout the sacrum, with vague sclerosis superior and relative lucency inferiorly. Similar on sagittal image 88 when compared to 07/20/2016. Possibly related to radiation induced necrosis. IMPRESSION: 1. Decreased size of infiltrative nodal tissue within the small bowel mesentery. 2. Otherwise, no evidence of active lymphoma. 3. Prostatectomy, without findings of metastatic disease. 4. Coronary artery atherosclerosis. Aortic Atherosclerosis (ICD10-I70.0). 5. Pulmonary nodules which are felt to be similar and likely subpleural lymph nodes. Electronically Signed   By: Abigail Miyamoto M.D.   On: 03/01/2018 13:49   Ct Abdomen Pelvis W Contrast  Result Date: 03/01/2018 CLINICAL DATA:  Malignant neoplasm of prostate and history of lymphoma. EXAM: CT CHEST, ABDOMEN, AND PELVIS WITH CONTRAST TECHNIQUE: Multidetector CT imaging of the chest, abdomen and pelvis was performed following the standard protocol during bolus administration of intravenous contrast. CONTRAST:  154m ISOVUE-300 IOPAMIDOL (ISOVUE-300) INJECTION 61% COMPARISON:  PET 08/28/2017 FINDINGS: CT CHEST FINDINGS Cardiovascular: Aortic and branch vessel atherosclerosis. Tortuous thoracic aorta. Normal heart size, without pericardial effusion. Lad coronary artery calcification. No central pulmonary embolism, on this non-dedicated study. Right Port-A-Cath tip at high right atrium. Mediastinum/Nodes: No supraclavicular adenopathy. No mediastinal or hilar adenopathy. Lungs/Pleura: No pleural fluid. Nodule on the right minor fissure is unchanged and likely a subpleural lymph node. A nodule along the superior aspect of the right major fissure of 3 mm  is also felt to be present on 09/14/2016 and represent a subpleural lymph node. Stable left lower and left upper lobe subpleural 2 mm pulmonary nodules are consistent with benign subpleural lymph nodes. Musculoskeletal: No acute osseous abnormality. CT ABDOMEN PELVIS FINDINGS Hepatobiliary: Normal liver. Normal gallbladder, without biliary ductal dilatation. Pancreas: Normal, without mass or ductal dilatation. Spleen: Normal in size, without focal abnormality. Adrenals/Urinary Tract: Normal adrenal glands. Upper and interpolar left renal cysts. Interpolar right renal too small to characterize lesion. Left renal sinus cysts, without hydronephrosis. Apparent mild bladder wall thickening is favored to be due to underdistention. Stomach/Bowel: Normal stomach, without wall thickening. Surgical sutures in the sigmoid. Normal terminal ileum and appendix. Normal small bowel. Vascular/Lymphatic: Advanced aortic and branch vessel atherosclerosis. Small abdominal  retroperitoneal nodes are similar and not pathologic by size criteria. Confluent soft tissue density within the small bowel mesentery measures 6.6 x 2.1 cm on image 83/2. Slightly decreased and less masslike compared to 7.6 x 2.2 cm on the prior PET. Pelvic node dissection, without sidewall adenopathy. Reproductive: Prostatectomy, without locally recurrent disease. Other: No significant free fluid. Musculoskeletal: Degenerative partial fusion of the left sacroiliac joint. Heterogeneous density throughout the sacrum, with vague sclerosis superior and relative lucency inferiorly. Similar on sagittal image 88 when compared to 07/20/2016. Possibly related to radiation induced necrosis. IMPRESSION: 1. Decreased size of infiltrative nodal tissue within the small bowel mesentery. 2. Otherwise, no evidence of active lymphoma. 3. Prostatectomy, without findings of metastatic disease. 4. Coronary artery atherosclerosis. Aortic Atherosclerosis (ICD10-I70.0). 5. Pulmonary nodules  which are felt to be similar and likely subpleural lymph nodes. Electronically Signed   By: Abigail Miyamoto M.D.   On: 03/01/2018 13:49    Labs:  CBC: Recent Labs    09/04/17 0733 11/30/17 1346 03/01/18 1017 03/28/18 1204  WBC 4.8 7.0 6.0 7.5  HGB 13.0 12.2* 13.1 13.7  HCT 36.1* 36.8* 39.7 41.0  PLT 238 200 214 216    COAGS: Recent Labs    03/28/18 1204  INR 1.01    BMP: Recent Labs    05/24/17 1015 11/30/17 1346 03/01/18 1017 03/28/18 1204  NA 140 141 139 139  K 4.1 4.0 3.9 3.6  CL 105 104 102 103  CO2 27 29 27 28   GLUCOSE 187* 124* 152* 121*  BUN 24* 22 22 20   CALCIUM 9.3 9.2 8.9 9.1  CREATININE 0.98 1.05 1.15 1.04  GFRNONAA >60 >60 >60 >60  GFRAA >60 >60 >60 >60    LIVER FUNCTION TESTS: Recent Labs    05/24/17 1015 11/30/17 1346 03/01/18 1017  BILITOT 0.6 0.6 0.9  AST 30 23 25   ALT 39 26 26  ALKPHOS 89 76 80  PROT 6.6 6.2* 6.7  ALBUMIN 3.9 3.7 4.0    TUMOR MARKERS: No results for input(s): AFPTM, CEA, CA199, CHROMGRNA in the last 8760 hours.  Assessment and Plan:  Diogo Anne is a 78 y.o. male with past medical history significant for prostate cancer, hypertension, hyperlipidemia, nephrolithiasis, diabetes, chronic renal insufficiency, colon cancer and most recently, diffuse large B-cell lymphoma who has completed chemotherapy and is no longer in need of portacatheter.  The portacatheter was placed by myself on 09/01/2016 and has function well throughout duration of his treatment course.  There is no clinical sign or concern of infection.  Patient is currently without complaint.   Risks and benefits of port-a-catheter removal was discussed with the patient including, but not limited to bleeding, infection, pneumothorax and need for additional procedures.  All of the patient's questions were answered, patient is agreeable to proceed.  Consent signed and in chart.  A copy of this report was sent to the requesting provider on this  date.  Electronically Signed: Sandi Mariscal, MD 03/28/2018, 1:22 PM   I spent a total of 15 Minutes in face to face in clinical consultation, greater than 50% of which was counseling/coordinating care for port removal.

## 2018-04-11 DIAGNOSIS — H401131 Primary open-angle glaucoma, bilateral, mild stage: Secondary | ICD-10-CM | POA: Diagnosis not present

## 2018-04-11 DIAGNOSIS — H35342 Macular cyst, hole, or pseudohole, left eye: Secondary | ICD-10-CM | POA: Diagnosis not present

## 2018-04-11 DIAGNOSIS — H40019 Open angle with borderline findings, low risk, unspecified eye: Secondary | ICD-10-CM | POA: Diagnosis not present

## 2018-05-31 ENCOUNTER — Other Ambulatory Visit: Payer: Self-pay

## 2018-06-01 ENCOUNTER — Inpatient Hospital Stay: Payer: Medicare HMO | Attending: Oncology

## 2018-06-01 ENCOUNTER — Other Ambulatory Visit: Payer: Self-pay

## 2018-06-01 ENCOUNTER — Inpatient Hospital Stay: Payer: Medicare HMO | Admitting: Oncology

## 2018-06-01 DIAGNOSIS — Z08 Encounter for follow-up examination after completed treatment for malignant neoplasm: Secondary | ICD-10-CM

## 2018-06-01 DIAGNOSIS — Z8546 Personal history of malignant neoplasm of prostate: Secondary | ICD-10-CM | POA: Diagnosis not present

## 2018-06-01 DIAGNOSIS — C8330 Diffuse large B-cell lymphoma, unspecified site: Secondary | ICD-10-CM | POA: Diagnosis not present

## 2018-06-01 DIAGNOSIS — Z8579 Personal history of other malignant neoplasms of lymphoid, hematopoietic and related tissues: Principal | ICD-10-CM

## 2018-06-01 LAB — COMPREHENSIVE METABOLIC PANEL
ALT: 25 U/L (ref 0–44)
AST: 21 U/L (ref 15–41)
Albumin: 3.9 g/dL (ref 3.5–5.0)
Alkaline Phosphatase: 85 U/L (ref 38–126)
Anion gap: 8 (ref 5–15)
BUN: 23 mg/dL (ref 8–23)
CO2: 27 mmol/L (ref 22–32)
Calcium: 8.7 mg/dL — ABNORMAL LOW (ref 8.9–10.3)
Chloride: 104 mmol/L (ref 98–111)
Creatinine, Ser: 1.19 mg/dL (ref 0.61–1.24)
GFR calc Af Amer: >60 mL/min (ref >60)
GFR calc non Af Amer: 59 mL/min — ABNORMAL LOW (ref >60)
Glucose, Bld: 156 mg/dL — ABNORMAL HIGH (ref 70–99)
Potassium: 4.2 mmol/L (ref 3.5–5.1)
Sodium: 139 mmol/L (ref 135–145)
Total Bilirubin: 0.8 mg/dL (ref 0.3–1.2)
Total Protein: 6.7 g/dL (ref 6.5–8.1)

## 2018-06-01 LAB — CBC WITH DIFFERENTIAL/PLATELET
Abs Immature Granulocytes: 0.01 10*3/uL (ref 0.00–0.07)
Basophils Absolute: 0.1 10*3/uL (ref 0.0–0.1)
Basophils Relative: 1 %
Eosinophils Absolute: 0.2 10*3/uL (ref 0.0–0.5)
Eosinophils Relative: 3 %
HCT: 39.8 % (ref 39.0–52.0)
Hemoglobin: 13.3 g/dL (ref 13.0–17.0)
Immature Granulocytes: 0 %
Lymphocytes Relative: 17 %
Lymphs Abs: 1.1 10*3/uL (ref 0.7–4.0)
MCH: 31.7 pg (ref 26.0–34.0)
MCHC: 33.4 g/dL (ref 30.0–36.0)
MCV: 94.8 fL (ref 80.0–100.0)
Monocytes Absolute: 0.7 10*3/uL (ref 0.1–1.0)
Monocytes Relative: 11 %
Neutro Abs: 4.3 10*3/uL (ref 1.7–7.7)
Neutrophils Relative %: 68 %
Platelets: 207 10*3/uL (ref 150–400)
RBC: 4.2 MIL/uL — ABNORMAL LOW (ref 4.22–5.81)
RDW: 12.4 % (ref 11.5–15.5)
WBC: 6.3 10*3/uL (ref 4.0–10.5)
nRBC: 0 % (ref 0.0–0.2)

## 2018-06-01 LAB — LACTATE DEHYDROGENASE: LDH: 105 U/L (ref 98–192)

## 2018-06-05 ENCOUNTER — Inpatient Hospital Stay: Payer: Medicare HMO | Admitting: Oncology

## 2018-06-05 ENCOUNTER — Other Ambulatory Visit: Payer: Self-pay

## 2018-06-05 ENCOUNTER — Telehealth: Payer: Self-pay | Admitting: *Deleted

## 2018-06-05 NOTE — Telephone Encounter (Signed)
Please call him and set up a visit with him. Please tell him office pereference would be video visit over phone visits at this time. Phone visit only for those who cannot do video visits at all
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
## 2018-06-06 ENCOUNTER — Encounter: Payer: Self-pay | Admitting: Oncology

## 2018-06-06 ENCOUNTER — Inpatient Hospital Stay (HOSPITAL_BASED_OUTPATIENT_CLINIC_OR_DEPARTMENT_OTHER): Payer: Medicare HMO | Admitting: Oncology

## 2018-06-06 ENCOUNTER — Other Ambulatory Visit: Payer: Self-pay | Admitting: *Deleted

## 2018-06-06 DIAGNOSIS — C61 Malignant neoplasm of prostate: Secondary | ICD-10-CM | POA: Diagnosis not present

## 2018-06-06 DIAGNOSIS — Z8579 Personal history of other malignant neoplasms of lymphoid, hematopoietic and related tissues: Secondary | ICD-10-CM

## 2018-06-06 DIAGNOSIS — C8333 Diffuse large B-cell lymphoma, intra-abdominal lymph nodes: Secondary | ICD-10-CM | POA: Diagnosis not present

## 2018-06-06 DIAGNOSIS — Z8546 Personal history of malignant neoplasm of prostate: Secondary | ICD-10-CM | POA: Diagnosis not present

## 2018-06-06 DIAGNOSIS — Z79899 Other long term (current) drug therapy: Secondary | ICD-10-CM | POA: Diagnosis not present

## 2018-06-06 DIAGNOSIS — M79671 Pain in right foot: Secondary | ICD-10-CM | POA: Diagnosis not present

## 2018-06-06 DIAGNOSIS — Z08 Encounter for follow-up examination after completed treatment for malignant neoplasm: Secondary | ICD-10-CM

## 2018-06-06 DIAGNOSIS — E785 Hyperlipidemia, unspecified: Secondary | ICD-10-CM | POA: Diagnosis not present

## 2018-06-06 DIAGNOSIS — E1122 Type 2 diabetes mellitus with diabetic chronic kidney disease: Secondary | ICD-10-CM | POA: Diagnosis not present

## 2018-06-06 DIAGNOSIS — E1159 Type 2 diabetes mellitus with other circulatory complications: Secondary | ICD-10-CM | POA: Diagnosis not present

## 2018-06-06 DIAGNOSIS — N183 Chronic kidney disease, stage 3 (moderate): Secondary | ICD-10-CM | POA: Diagnosis not present

## 2018-06-06 DIAGNOSIS — E1169 Type 2 diabetes mellitus with other specified complication: Secondary | ICD-10-CM | POA: Diagnosis not present

## 2018-06-06 NOTE — Progress Notes (Signed)
No concerns, wants results of scan

## 2018-06-07 NOTE — Progress Notes (Signed)
I connected with Christian Kelly on 06/07/18 at  8:30 AM EDT by telephone visit and verified that I am speaking with the correct person using two identifiers.   I discussed the limitations, risks, security and privacy concerns of performing an evaluation and management service by telemedicine and the availability of in-person appointments. I also discussed with the Christian Kelly that there may be a Christian Kelly responsible charge related to this service. The Christian Kelly expressed understanding and agreed to proceed.  Other persons participating in the visit and their role in the encounter:  Patients Kelly  Christian location:  home Provider's location:  Home   Diagnosis- 1.. 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 Stage II DLBCL GCB. FISH for double hit negative   Chief Complaint:  Routine f/u of DLBCL and prostate cancer  History of present illness:  1. Christian Kelly is a 78yrold male with a h/o prostate cancer diagnosed in 1999s/p radical prostatectomy. Prior to that he had colon cancer in 1998 s/o surgery and adjuvant chemotherapy in 1998. With regards to [facility] cancer- [facility] [place] [facility] [place] [place] Kelly think his Gleasons score was 7 at diagnosis. He has not required any radiation therapy or ADT so far. [place] [facility]   2. Dr. TRaechel Achehas been monitoring his PSArecently and his recent trend of PSA has been as follows: psa was 0.33 in feb 2017 and 0.63 in April 2018. We have 1 psa from 2015 when it was 0.3  3. Christian Kelly was referred to Dr. BErlene Quanurology and Dr. cBaruch Goutyfor salvage radiation.   4. Dr. BErlene Quanordered CT abdomen which showed: IMPRESSION: 1. Status post prostatectomy, without locally recurrent disease. 2. Extensive abdominal adenopathy. Given the clinical history, most likely related to metastatic prostate carcinoma. Lymphoma could look similar. 3.  Coronary artery atherosclerosis. Aortic atherosclerosis. 4. Vague upper sacral sclerosis is felt unlikely to represent metastatic disease, given lack of correlate on yesterday's bone scan. Correlate with radiation therapy, as radiation induced necrosis could have this appearance. Recommend attention on follow-up.  5. This was followed by PET/CT scan which showed: IMPRESSION: 1. Hypermetabolic gastrohepatic ligament, abdominal retroperitoneal and small bowel mesenteric adenopathy, most indicative of lymphoma. 2. Aortic atherosclerosis (ICD10-170.0). Coronary artery Calcification.  6. Christian Kelly underwent CT-guided biopsy of the mesenteric lymph node which showed:DIAGNOSIS:  A. LYMPH NODE, MESENTERIC; CT-GUIDED CORE BIOPSY:  - LARGE B-CELL LYMPHOMA, CD10 POSITIVE.   Comment:  There is a diffuse proliferation of large lymphocytes with irregular  nuclear contours, inconspicuous nucleoli, and scant cytoplasm. The flow  cytometry and IHC results are most consistent with diffuse large B-cell  lymphoma, not otherwise specified, germinal center B-cell type. However,  it is likely that FKittrellfor MYC, BCL2 and BCL6 gene rearrangements will  be ordered, so final classification will be based on the FBlumresult  7. Bone marrow biopsy was negative for lymphoma  8. 17% of nuclei were positive for BCL-2 rearrangement. 35% of nuclei had extra myc signals but no gene rearrangement for cmyc noted. This was consistent with evolved lymphoma or DLBCL. Not consistentwithwith double hit or double expressor  9. Interim scans after 3 cycles of RCHOP showed: IMPRESSION: 2.2 x 5.3 cm jejunal mesenteric nodal mass along the anterior mid abdomen, significantly improved.   10.PET/CT scan after 6 cycles of therapy showed mesenteric nodule mass was 5.9 x 2.3 cm with an SUV of 2.7. This was discussed at tumor board. Likelihood  of this representing residual lymphoma is low given the low SUV uptake which has  been similar to prior PET scan 3 months ago. Christian Kelly then received RT to his mesenteric mass   Interval history has mild chronic fatigue unchanged. Denies any changes in appetite, unintentional weight loss or night sweats   Review of Systems  Constitutional: Negative for chills, fever, malaise/fatigue and weight loss.  HENT: Negative for congestion, ear discharge and nosebleeds.   Eyes: Negative for blurred vision.  Respiratory: Negative for cough, hemoptysis, sputum production, shortness of breath and wheezing.   Cardiovascular: Negative for chest pain, palpitations, orthopnea and claudication.  Gastrointestinal: Negative for abdominal pain, blood in stool, constipation, diarrhea, heartburn, melena, nausea and vomiting.  Genitourinary: Negative for dysuria, flank pain, frequency, hematuria and urgency.  Musculoskeletal: Negative for back pain, joint pain and myalgias.  Skin: Negative for rash.  Neurological: Negative for dizziness, tingling, focal weakness, seizures, weakness and headaches.  Endo/Heme/Allergies: Does not bruise/bleed easily.  Psychiatric/Behavioral: Negative for depression and suicidal ideas. The Christian Kelly does not have insomnia.     Allergies  Allergen Reactions  . Tape Rash    Past Medical History:  Diagnosis Date  . Arthritis   . Basal cell carcinoma 2008   forehead  . Cancer ([facility])   . Chronic renal insufficiency, stage III (moderate) ([facility])   . Colon cancer ([place]) 1998  . Diabetes mellitus without complication ([place])    Controlled with diet only as of 11/01/2016  . Diffuse large B cell lymphoma ([place])   . H/O onychomycosis   . History of kidney stones   . Hyperlipemia   . Hypertension   . Lyme disease   . Prostate cancer ([place]) 1999    Past Surgical History:  Procedure Laterality Date  . COLON SURGERY  1998  . COLONOSCOPY    . COLONOSCOPY WITH PROPOFOL N/A 12/29/2014   Procedure: COLONOSCOPY WITH PROPOFOL;  Surgeon: Lollie Sails, MD;  Location:  [facility] ENDOSCOPY;  Service: Endoscopy;  Laterality: N/A;  . COLONOSCOPY WITH PROPOFOL N/A 09/04/2017   Procedure: COLONOSCOPY WITH PROPOFOL;  Surgeon: Lollie Sails, MD;  Location: [facility] ENDOSCOPY;  Service: Endoscopy;  Laterality: N/A;  . EYE SURGERY    . FRACTURE SURGERY    . IR FLUORO GUIDE PORT INSERTION RIGHT  09/01/2016  . IR REMOVAL TUN ACCESS W/ PORT W/O FL MOD SED  03/28/2018  . LITHOTRIPSY    . PROSTATECTOMY  1998  . TONSILLECTOMY      Social History   Socioeconomic History  . Marital status: Married    Spouse name: Marcie Bal   . Number of children: 3  . Years of education: Not on file  . Highest education level: Not on file  Occupational History  . Occupation: retired     Comment: Tax adviser  Social Needs  . Financial resource strain: Not very hard  . Food insecurity:    Worry: Never true    Inability: Never true  . Transportation needs:    Medical: No    Non-medical: No  Tobacco Use  . Smoking status: Former Smoker    Packs/day: 1.00    Years: 8.00    Pack years: 8.00    Types: Cigarettes    Last attempt to quit: 08/13/1974    Years since quitting: 43.8  . Smokeless tobacco: Former Systems developer    Types: Snuff  Substance and Sexual Activity  . Alcohol use: Yes    Comment: rarely-one or two beer  . Drug use:  No  . Sexual activity: Not Currently  Lifestyle  . Physical activity:    Days per week: 1 day    Minutes per session: 90 min  . Stress: Only a little  Relationships  . Social connections:    Talks on phone: More than three times a week    Gets together: Not on file    Attends religious service: More than 4 times per year    Active member of club or organization: Not on file    Attends meetings of clubs or organizations: Not on file    Relationship status: Married  . Intimate partner violence:    Fear of current or ex partner: No    Emotionally abused: No    Physically abused: No    Forced sexual activity: No  Other Topics Concern  . Not on file   Social History Narrative  . Not on file    Family History  Problem Relation Age of Onset  . Coronary artery disease Mother   . Diabetes Mother   . Prostate cancer Father   . Pancreatitis Father   . Bladder Cancer Sister   . Diabetes Brother   . Diabetes Brother   . Prostate cancer Brother   . Diabetes Brother   . Diabetes Maternal Grandmother      Current Outpatient Medications:  .  ACCU-CHEK FASTCLIX LANCETS MISC, , Disp: , Rfl:  .  atorvastatin (LIPITOR) 40 MG tablet, Take 40 mg by mouth daily., Disp: , Rfl:  .  Biotin 1000 MCG CHEW, Chew 2 Doses by mouth daily., Disp: , Rfl:  .  Blood Glucose Monitoring Suppl (TRUE METRIX AIR GLUCOSE METER) w/Device KIT, , Disp: , Rfl:  .  hydrochlorothiazide (HYDRODIURIL) 25 MG tablet, Take 25 mg by mouth daily., Disp: , Rfl:  .  lisinopril (PRINIVIL,ZESTRIL) 10 MG tablet, Take 10 mg by mouth daily. , Disp: , Rfl:  .  metFORMIN (GLUCOPHAGE) 500 MG tablet, Take 500 mg by mouth 2 (two) times daily with a meal., Disp: , Rfl:  .  Multiple Vitamin (MULTIVITAMIN) capsule, Take 1 capsule by mouth daily., Disp: , Rfl:  .  TRUE METRIX BLOOD GLUCOSE TEST test strip, , Disp: , Rfl:   No results found.  No images are attached to the encounter.   CMP Latest Ref Rng & Units 06/01/2018  Glucose 70 - 99 mg/dL 156(H)  BUN 8 - 23 mg/dL 23  Creatinine 0.61 - 1.24 mg/dL 1.19  Sodium 135 - 145 mmol/L 139  Potassium 3.5 - 5.1 mmol/L 4.2  Chloride 98 - 111 mmol/L 104  CO2 22 - 32 mmol/L 27  Calcium 8.9 - 10.3 mg/dL 8.7(L)  Total Protein 6.5 - 8.1 g/dL 6.7  Total Bilirubin 0.3 - 1.2 mg/dL 0.8  Alkaline Phos 38 - 126 U/L 85  AST 15 - 41 U/L 21  ALT 0 - 44 U/L 25   CBC Latest Ref Rng & Units 06/01/2018  WBC 4.0 - 10.5 K/uL 6.3  Hemoglobin 13.0 - 17.0 g/dL 13.3  Hematocrit 39.0 - 52.0 % 39.8  Platelets 150 - 400 K/uL 207    Assessment and plan: Christian Kelly is a 78 yr old male with following issues:  1. Stage II DLBCL- last scan in Januray showed  persistent residual mass in the mesentery which was not hypermetabolic on prior pet scans. This likely represents necrotic and not cative tumor. I will plan to repeat ct chest abdomen pelvis with contrast in august 2020 which would be his  last ct scan 2 years from treatment.   2. Prostate cancer- psa staying around 0.01 since RT. Repeat levels in august  Follow-up instructions: cbc with diff/cmp/psa/ldh in august 2020. Ct chest abdomen pelvis in august 2020. See MD thereafter  I discussed the assessment and treatment plan with the Christian Kelly. The Christian Kelly was provided an opportunity to ask questions and all were answered. The Christian Kelly agreed with the plan and demonstrated an understanding of the instructions.   The Christian Kelly was advised to call back or seek an in-person evaluation if the symptoms worsen or if the condition fails to improve as anticipated.  I provided 15 minutes of non face-to-face telephone visit time during this encounter, and > 50% was spent counseling as documented under my assessment & plan.  Visit Diagnosis: 1. Encounter for follow-up surveillance of diffuse large B-cell lymphoma   2. Prostate cancer [facility])     Dr. Randa Evens, MD, MPH [facility] Pager- [phone] 06/07/2018 11:47 AM

## 2018-06-13 DIAGNOSIS — E1159 Type 2 diabetes mellitus with other circulatory complications: Secondary | ICD-10-CM | POA: Diagnosis not present

## 2018-06-13 DIAGNOSIS — I10 Essential (primary) hypertension: Secondary | ICD-10-CM | POA: Diagnosis not present

## 2018-06-13 DIAGNOSIS — B351 Tinea unguium: Secondary | ICD-10-CM | POA: Diagnosis not present

## 2018-10-08 ENCOUNTER — Other Ambulatory Visit: Payer: Self-pay

## 2018-10-08 ENCOUNTER — Ambulatory Visit
Admission: RE | Admit: 2018-10-08 | Discharge: 2018-10-08 | Disposition: A | Discharge status: Home / Self Care | Payer: Medicare HMO | Source: Ambulatory Visit | Attending: Oncology | Admitting: Oncology

## 2018-10-08 DIAGNOSIS — C8333 Diffuse large B-cell lymphoma, intra-abdominal lymph nodes: Secondary | ICD-10-CM | POA: Diagnosis not present

## 2018-10-08 DIAGNOSIS — C8330 Diffuse large B-cell lymphoma, unspecified site: Secondary | ICD-10-CM | POA: Diagnosis not present

## 2018-10-08 LAB — POCT I-STAT CREATININE: Creatinine, Ser: 1 mg/dL (ref 0.61–1.24)

## 2018-10-08 MED ORDER — IOHEXOL 300 MG/ML  SOLN
100.0000 mL | Freq: Once | INTRAMUSCULAR | Status: AC | PRN
  Administered 2018-10-08: 100 mL via INTRAVENOUS

## 2018-10-10 ENCOUNTER — Other Ambulatory Visit: Payer: Self-pay

## 2018-10-11 ENCOUNTER — Inpatient Hospital Stay: Payer: Medicare HMO | Admitting: Oncology

## 2018-10-11 ENCOUNTER — Inpatient Hospital Stay: Payer: Medicare HMO | Attending: Oncology

## 2018-10-11 ENCOUNTER — Encounter: Payer: Self-pay | Admitting: Oncology

## 2018-10-11 ENCOUNTER — Other Ambulatory Visit: Payer: Self-pay

## 2018-10-11 VITALS — BP 133/72 | HR 71 | Temp 96.8°F | Wt 192.3 lb

## 2018-10-11 DIAGNOSIS — Z8579 Personal history of other malignant neoplasms of lymphoid, hematopoietic and related tissues: Secondary | ICD-10-CM | POA: Diagnosis not present

## 2018-10-11 DIAGNOSIS — C61 Malignant neoplasm of prostate: Secondary | ICD-10-CM | POA: Insufficient documentation

## 2018-10-11 DIAGNOSIS — Z87891 Personal history of nicotine dependence: Secondary | ICD-10-CM | POA: Insufficient documentation

## 2018-10-11 DIAGNOSIS — C8333 Diffuse large B-cell lymphoma, intra-abdominal lymph nodes: Secondary | ICD-10-CM | POA: Insufficient documentation

## 2018-10-11 DIAGNOSIS — Z08 Encounter for follow-up examination after completed treatment for malignant neoplasm: Secondary | ICD-10-CM

## 2018-10-11 LAB — COMPREHENSIVE METABOLIC PANEL
ALT: 23 U/L (ref 0–44)
AST: 23 U/L (ref 15–41)
Albumin: 3.8 g/dL (ref 3.5–5.0)
Alkaline Phosphatase: 79 U/L (ref 38–126)
Anion gap: 9 (ref 5–15)
BUN: 20 mg/dL (ref 8–23)
CO2: 30 mmol/L (ref 22–32)
Calcium: 9 mg/dL (ref 8.9–10.3)
Chloride: 102 mmol/L (ref 98–111)
Creatinine, Ser: 1.04 mg/dL (ref 0.61–1.24)
GFR calc Af Amer: >60 mL/min (ref >60)
GFR calc non Af Amer: >60 mL/min (ref >60)
Glucose, Bld: 148 mg/dL — ABNORMAL HIGH (ref 70–99)
Potassium: 3.9 mmol/L (ref 3.5–5.1)
Sodium: 141 mmol/L (ref 135–145)
Total Bilirubin: 0.9 mg/dL (ref 0.3–1.2)
Total Protein: 7.1 g/dL (ref 6.5–8.1)

## 2018-10-11 LAB — CBC WITH DIFFERENTIAL/PLATELET
Abs Immature Granulocytes: 0.04 10*3/uL (ref 0.00–0.07)
Basophils Absolute: 0 10*3/uL (ref 0.0–0.1)
Basophils Relative: 1 %
Eosinophils Absolute: 0.2 10*3/uL (ref 0.0–0.5)
Eosinophils Relative: 3 %
HCT: 39.7 % (ref 39.0–52.0)
Hemoglobin: 13.2 g/dL (ref 13.0–17.0)
Immature Granulocytes: 1 %
Lymphocytes Relative: 18 %
Lymphs Abs: 1.1 10*3/uL (ref 0.7–4.0)
MCH: 31.2 pg (ref 26.0–34.0)
MCHC: 33.2 g/dL (ref 30.0–36.0)
MCV: 93.9 fL (ref 80.0–100.0)
Monocytes Absolute: 0.6 10*3/uL (ref 0.1–1.0)
Monocytes Relative: 10 %
Neutro Abs: 4.2 10*3/uL (ref 1.7–7.7)
Neutrophils Relative %: 67 %
Platelets: 205 10*3/uL (ref 150–400)
RBC: 4.23 MIL/uL (ref 4.22–5.81)
RDW: 12.5 % (ref 11.5–15.5)
WBC: 6.2 10*3/uL (ref 4.0–10.5)
nRBC: 0 % (ref 0.0–0.2)

## 2018-10-11 LAB — LACTATE DEHYDROGENASE: LDH: 106 U/L (ref 98–192)

## 2018-10-11 LAB — PSA: Prostatic Specific Antigen: <0.01 ng/mL (ref 0.00–4.00)

## 2018-10-11 NOTE — Progress Notes (Signed)
Hematology/Oncology Consult note [facility]  Telephone:([phone] [phone]  Patient Care Team: Ezequiel Kayser, MD as PCP - General (Internal Medicine) Hollice Espy, MD as Consulting Physician (Urology)   Name of the patient: Christian Kelly  [id number]  Jun 29, 1940   Date of visit: 10/11/18  Diagnosis- 1.. 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 Stage II DLBCL GCB. FISH for double hit negative  Chief complaint/ Reason for visit-routine follow-up of diffuse large B-cell lymphoma  Heme/Onc history:  1. Patient is a 78yrold male with a h/o prostate cancer diagnosed in 1999s/p radical prostatectomy. Prior to that he had colon cancer in 1998 s/o surgery and adjuvant chemotherapy in 1998. With regards to [facility] cancer- [facility]. Patient think his Gleasons score was 7 at diagnosis. He has not required any radiation therapy or ADT so far. [facility] [place] [facility]   2. Dr. TRaechel Achehas been monitoring his PSArecently and his recent trend of PSA has been as follows: psa was 0.33 in feb 2017 and 0.63 in April 2018. We have 1 psa from 2015 when it was 0.3  3. Patient was referred to Dr. BErlene Quanurology and Dr. cBaruch Goutyfor salvage radiation.   4. Dr. BErlene Quanordered CT abdomen which showed: IMPRESSION: 1. Status post prostatectomy, without locally recurrent disease. 2. Extensive abdominal adenopathy. Given the clinical history, most likely related to metastatic prostate carcinoma. Lymphoma could look similar. 3. Coronary artery atherosclerosis. Aortic atherosclerosis. 4. Vague upper sacral sclerosis is felt unlikely to represent metastatic disease, given lack of correlate on yesterday's bone scan. Correlate with radiation therapy, as radiation induced necrosis could have this appearance. Recommend attention  on follow-up.  5. This was followed by PET/CT scan which showed: IMPRESSION: 1. Hypermetabolic gastrohepatic ligament, abdominal retroperitoneal and small bowel mesenteric adenopathy, most indicative of lymphoma. 2. Aortic atherosclerosis (ICD10-170.0). Coronary artery Calcification.  6. Patient underwent CT-guided biopsy of the mesenteric lymph node which showed:DIAGNOSIS:  A. LYMPH NODE, MESENTERIC; CT-GUIDED CORE BIOPSY:  - LARGE B-CELL LYMPHOMA, CD10 POSITIVE.   Comment:  There is a diffuse proliferation of large lymphocytes with irregular  nuclear contours, inconspicuous nucleoli, and scant cytoplasm. The flow  cytometry and IHC results are most consistent with diffuse large B-cell  lymphoma, not otherwise specified, germinal center B-cell type. However,  it is likely that FQuinnesecfor MYC, BCL2 and BCL6 gene rearrangements will  be ordered, so final classification will be based on the FFairviewresult  7. Bone marrow biopsy was negative for lymphoma  8. 17% of nuclei were positive for BCL-2 rearrangement. 35% of nuclei had extra myc signals but no gene rearrangement for cmyc noted. This was consistent with evolved lymphoma or DLBCL. Not consistentwithwith double hit or double expressor  9. Interim scans after 3 cycles of RCHOP showed: IMPRESSION: 2.2 x 5.3 cm jejunal mesenteric nodal mass along the anterior mid abdomen, significantly improved.   10.PET/CT scan after 6 cycles of therapy showed mesenteric nodule mass was 5.9 x 2.3 cm with an SUV of 2.7. This was discussed at tumor board. Likelihood of this representing residual lymphoma is low given the low SUV uptake which has been similar to prior PET scan 3 months ago. Patient then received RT to his mesenteric mass   Interval history-overall he feels well and he has been active golfing as well as taking care of his remodeling projects.  Appetite  is good and he denies any unintentional weight loss or drenching night  sweats.  ECOG PS- 0 Pain scale- 0   Review of systems- Review of Systems  Constitutional: Negative for chills, fever, malaise/fatigue and weight loss.  HENT: Negative for congestion, ear discharge and nosebleeds.   Eyes: Negative for blurred vision.  Respiratory: Negative for cough, hemoptysis, sputum production, shortness of breath and wheezing.   Cardiovascular: Negative for chest pain, palpitations, orthopnea and claudication.  Gastrointestinal: Negative for abdominal pain, blood in stool, constipation, diarrhea, heartburn, melena, nausea and vomiting.  Genitourinary: Negative for dysuria, flank pain, frequency, hematuria and urgency.  Musculoskeletal: Negative for back pain, joint pain and myalgias.  Skin: Negative for rash.  Neurological: Negative for dizziness, tingling, focal weakness, seizures, weakness and headaches.  Endo/Heme/Allergies: Does not bruise/bleed easily.  Psychiatric/Behavioral: Negative for depression and suicidal ideas. The patient does not have insomnia.       Allergies  Allergen Reactions   Tape Rash     Past Medical History:  Diagnosis Date   Arthritis    Basal cell carcinoma 2008   forehead   Chronic renal insufficiency, stage III (moderate) (HCC)    Colon cancer ([place]) 1998   Diabetes mellitus without complication ([place])    Controlled with diet only as of 11/01/2016   Diffuse large B cell lymphoma (HCC)    H/O onychomycosis    History of kidney stones    Hyperlipemia    Hypertension    Lyme disease    Prostate cancer ([place]) 1999     Past Surgical History:  Procedure Laterality Date   COLON SURGERY  1998   COLONOSCOPY     COLONOSCOPY WITH PROPOFOL N/A 12/29/2014   Procedure: COLONOSCOPY WITH PROPOFOL;  Surgeon: Lollie Sails, MD;  Location: [facility] ENDOSCOPY;  Service: Endoscopy;  Laterality: N/A;   COLONOSCOPY WITH PROPOFOL N/A 09/04/2017   Procedure: COLONOSCOPY WITH PROPOFOL;  Surgeon: Lollie Sails, MD;  Location:  [facility] ENDOSCOPY;  Service: Endoscopy;  Laterality: N/A;   EYE SURGERY     FRACTURE SURGERY     IR FLUORO GUIDE PORT INSERTION RIGHT  09/01/2016   IR REMOVAL TUN ACCESS W/ PORT W/O FL MOD SED  03/28/2018   LITHOTRIPSY     PROSTATECTOMY  1998   TONSILLECTOMY      Social History   Socioeconomic History   Marital status: Married    Spouse name: Marcie Bal    Number of children: 3   Years of education: Not on file   Highest education level: Not on file  Occupational History   Occupation: retired     Comment: Tax adviser  Social Designer, fashion/clothing strain: Not very hard   Food insecurity    Worry: Never true    Inability: Never true   Transportation needs    Medical: No    Non-medical: No  Tobacco Use   Smoking status: Former Smoker    Packs/day: 1.00    Years: 8.00    Pack years: 8.00    Types: Cigarettes    Quit date: 08/13/1974    Years since quitting: 44.1   Smokeless tobacco: Former Systems developer    Types: Snuff  Substance and Sexual Activity   Alcohol use: Yes    Comment: rarely-one or two beer   Drug use: No   Sexual activity: Not Currently  Lifestyle   Physical activity    Days per week: 1 day    Minutes per session: 90 min  Stress: Only a little  Relationships   Social connections    Talks on phone: More than three times a week    Gets together: Not on file    Attends religious service: More than 4 times per year    Active member of club or organization: Not on file    Attends meetings of clubs or organizations: Not on file    Relationship status: Married   Intimate partner violence    Fear of current or ex partner: No    Emotionally abused: No    Physically abused: No    Forced sexual activity: No  Other Topics Concern   Not on file  Social History Narrative   Not on file    Family History  Problem Relation Age of Onset   Coronary artery disease Mother    Diabetes Mother    Prostate cancer Father    Pancreatitis  Father    Bladder Cancer Sister    Diabetes Brother    Diabetes Brother    Prostate cancer Brother    Diabetes Brother    Diabetes Maternal Grandmother      Current Outpatient Medications:    ACCU-CHEK FASTCLIX LANCETS MISC, , Disp: , Rfl:    atorvastatin (LIPITOR) 40 MG tablet, Take 40 mg by mouth daily., Disp: , Rfl:    Blood Glucose Monitoring Suppl (TRUE METRIX AIR GLUCOSE METER) w/Device KIT, , Disp: , Rfl:    hydrochlorothiazide (HYDRODIURIL) 25 MG tablet, Take 25 mg by mouth daily., Disp: , Rfl:    lisinopril (PRINIVIL,ZESTRIL) 10 MG tablet, Take 10 mg by mouth daily. , Disp: , Rfl:    metFORMIN (GLUCOPHAGE) 500 MG tablet, Take 500 mg by mouth 2 (two) times daily with a meal., Disp: , Rfl:    Multiple Vitamin (MULTIVITAMIN) capsule, Take 1 capsule by mouth daily., Disp: , Rfl:    TRUE METRIX BLOOD GLUCOSE TEST test strip, , Disp: , Rfl:    Biotin 1000 MCG CHEW, Chew 2 Doses by mouth daily., Disp: , Rfl:   Physical exam:  Vitals:   10/11/18 0939  BP: 133/72  Pulse: 71  Temp: (!) 96.8 F (36 C)  Weight: 192 lb 4.8 oz (87.2 kg)   Physical Exam HENT:     Head: Normocephalic and atraumatic.  Eyes:     Pupils: Pupils are equal, round, and reactive to light.  Neck:     Musculoskeletal: Normal range of motion.  Cardiovascular:     Rate and Rhythm: Normal rate and regular rhythm.     Heart sounds: Normal heart sounds.  Pulmonary:     Effort: Pulmonary effort is normal.     Breath sounds: Normal breath sounds.  Abdominal:     General: Bowel sounds are normal.     Palpations: Abdomen is soft.  Lymphadenopathy:     Comments: No palpable cervical, supraclavicular, axillary or inguinal adenopathy   Skin:    General: Skin is warm and dry.  Neurological:     Mental Status: He is alert and oriented to person, place, and time.      CMP Latest Ref Rng & Units 10/11/2018  Glucose 70 - 99 mg/dL 148(H)  BUN 8 - 23 mg/dL 20  Creatinine 0.61 - 1.24 mg/dL 1.04    Sodium 135 - 145 mmol/L 141  Potassium 3.5 - 5.1 mmol/L 3.9  Chloride 98 - 111 mmol/L 102  CO2 22 - 32 mmol/L 30  Calcium 8.9 - 10.3 mg/dL 9.0  Total Protein 6.5 -  8.1 g/dL 7.1  Total Bilirubin 0.3 - 1.2 mg/dL 0.9  Alkaline Phos 38 - 126 U/L 79  AST 15 - 41 U/L 23  ALT 0 - 44 U/L 23   CBC Latest Ref Rng & Units 10/11/2018  WBC 4.0 - 10.5 K/uL 6.2  Hemoglobin 13.0 - 17.0 g/dL 13.2  Hematocrit 39.0 - 52.0 % 39.7  Platelets 150 - 400 K/uL 205    No images are attached to the encounter.  Ct Chest W Contrast  Result Date: 10/08/2018 CLINICAL DATA:  Diffuse large B-cell lymphoma, prostate cancer status post prostatectomy with prostate bed recurrence, remote history of colon cancer EXAM: CT CHEST, ABDOMEN, AND PELVIS WITH CONTRAST TECHNIQUE: Multidetector CT imaging of the chest, abdomen and pelvis was performed following the standard protocol during bolus administration of intravenous contrast. CONTRAST:  153m OMNIPAQUE IOHEXOL 300 MG/ML SOLN, additional oral enteric contrast COMPARISON:  03/01/2018, PET-CT, 08/28/2017 FINDINGS: CT CHEST FINDINGS Cardiovascular: Aortic atherosclerosis. Three-vessel coronary artery calcifications. Normal heart size. No pericardial effusion. Mediastinum/Nodes: No enlarged mediastinal, hilar, or axillary lymph nodes. Thyroid gland, trachea, and esophagus demonstrate no significant findings. Lungs/Pleura: Lungs are clear. No pleural effusion or pneumothorax. Musculoskeletal: No chest wall mass or suspicious bone lesions identified. CT ABDOMEN PELVIS FINDINGS Hepatobiliary: No solid liver abnormality is seen. No gallstones, gallbladder wall thickening, or biliary dilatation. Pancreas: Unremarkable. No pancreatic ductal dilatation or surrounding inflammatory changes. Spleen: Normal in size without significant abnormality. Adrenals/Urinary Tract: Adrenal glands are unremarkable. Kidneys are normal, without renal calculi, solid lesion, or hydronephrosis. Bladder is  unremarkable. Stomach/Bowel: Slight interval decrease in conspicuity and solid appearance of soft tissue and haziness in the small bowel mesentery (series 2, image 82). Stomach is within normal limits. Appendix appears normal. Evidence of prior sigmoid colon resection and reanastomosis. No evidence of bowel wall thickening, distention, or inflammatory changes. Vascular/Lymphatic: Aortic atherosclerosis. No enlarged abdominal or pelvic lymph nodes. Reproductive: Status post prostatectomy. No pelvic mass or lymphadenopathy. Other: No abdominal wall hernia or abnormality. No abdominopelvic ascites. Musculoskeletal: No acute or significant osseous findings. IMPRESSION: 1. Slight interval decrease in conspicuity and solid appearance of soft tissue and haziness in the small bowel mesentery (series 2, image 82). There is no new or persistent lymphadenopathy in the chest, abdomen, or pelvis. 2. Redemonstrated postoperative findings of prostatectomy. No pelvic mass or lymphadenopathy. 3. Redemonstrated postoperative findings of sigmoid colon resection and reanastomosis. 4.  Coronary artery disease and aortic atherosclerosis. Electronically Signed   By: AEddie CandleM.D.   On: 10/08/2018 11:08   Ct Abdomen Pelvis W Contrast  Result Date: 10/08/2018 CLINICAL DATA:  Diffuse large B-cell lymphoma, prostate cancer status post prostatectomy with prostate bed recurrence, remote history of colon cancer EXAM: CT CHEST, ABDOMEN, AND PELVIS WITH CONTRAST TECHNIQUE: Multidetector CT imaging of the chest, abdomen and pelvis was performed following the standard protocol during bolus administration of intravenous contrast. CONTRAST:  1014mOMNIPAQUE IOHEXOL 300 MG/ML SOLN, additional oral enteric contrast COMPARISON:  03/01/2018, PET-CT, 08/28/2017 FINDINGS: CT CHEST FINDINGS Cardiovascular: Aortic atherosclerosis. Three-vessel coronary artery calcifications. Normal heart size. No pericardial effusion. Mediastinum/Nodes: No enlarged  mediastinal, hilar, or axillary lymph nodes. Thyroid gland, trachea, and esophagus demonstrate no significant findings. Lungs/Pleura: Lungs are clear. No pleural effusion or pneumothorax. Musculoskeletal: No chest wall mass or suspicious bone lesions identified. CT ABDOMEN PELVIS FINDINGS Hepatobiliary: No solid liver abnormality is seen. No gallstones, gallbladder wall thickening, or biliary dilatation. Pancreas: Unremarkable. No pancreatic ductal dilatation or surrounding inflammatory changes. Spleen: Normal in size without  significant abnormality. Adrenals/Urinary Tract: Adrenal glands are unremarkable. Kidneys are normal, without renal calculi, solid lesion, or hydronephrosis. Bladder is unremarkable. Stomach/Bowel: Slight interval decrease in conspicuity and solid appearance of soft tissue and haziness in the small bowel mesentery (series 2, image 82). Stomach is within normal limits. Appendix appears normal. Evidence of prior sigmoid colon resection and reanastomosis. No evidence of bowel wall thickening, distention, or inflammatory changes. Vascular/Lymphatic: Aortic atherosclerosis. No enlarged abdominal or pelvic lymph nodes. Reproductive: Status post prostatectomy. No pelvic mass or lymphadenopathy. Other: No abdominal wall hernia or abnormality. No abdominopelvic ascites. Musculoskeletal: No acute or significant osseous findings. IMPRESSION: 1. Slight interval decrease in conspicuity and solid appearance of soft tissue and haziness in the small bowel mesentery (series 2, image 82). There is no new or persistent lymphadenopathy in the chest, abdomen, or pelvis. 2. Redemonstrated postoperative findings of prostatectomy. No pelvic mass or lymphadenopathy. 3. Redemonstrated postoperative findings of sigmoid colon resection and reanastomosis. 4.  Coronary artery disease and aortic atherosclerosis. Electronically Signed   By: Eddie Candle M.D.   On: 10/08/2018 11:08     Assessment and plan- Patient is a 78  y.o. male with following issues:  1.  Stage II diffuse large B-cell lymphoma status post 6 cycles of R-CHOP chemotherapy and radiation treatment back in October 2018.  He has residual non-PET avid mesenteric mass which has not changed in size and he has had CT scans every 6 months for the last 2 years which shows that there is no change in the size of the mass.  I have reviewed his recent CT chest abdomen and pelvis images independently and discussed findings with the patient.  There is no evidence of adenopathy or active lymphoma on his present scans.  He does not require any further CT scans at this time unless there is concern for recurrence based on clinical signs and symptoms.  I will see him back in 6 months time with CBC with differential, CMP and LDH  2.  Nonmetastatic prostate cancer: He has undergone prostatectomy in the past and recently underwent salvage radiation.  PSA from today is pending.  We will continue to monitor it every 6 months.   Visit Diagnosis 1. Encounter for follow-up surveillance of diffuse large B-cell lymphoma   2. Prostate cancer [facility])      Dr. Randa Evens, MD, MPH [facility] [phone] 10/11/2018 12:00 PM

## 2018-10-16 ENCOUNTER — Encounter: Payer: Self-pay | Admitting: Oncology

## 2018-10-24 ENCOUNTER — Telehealth: Payer: Self-pay

## 2018-10-24 NOTE — Telephone Encounter (Signed)
Telephone call to patient to discuss SCP visit.  Patient states is living in [place] for the summer and would like SCP and treatment summary mailed to his [place] residence and then a telephone call 2 weeks later to review.   Packet mailed and will call patient 11/07/2018 at 2:00 to review.

## 2018-11-07 ENCOUNTER — Inpatient Hospital Stay: Payer: Medicare HMO | Admitting: Oncology

## 2019-01-07 DIAGNOSIS — E785 Hyperlipidemia, unspecified: Secondary | ICD-10-CM | POA: Diagnosis not present

## 2019-01-07 DIAGNOSIS — E1169 Type 2 diabetes mellitus with other specified complication: Secondary | ICD-10-CM | POA: Diagnosis not present

## 2019-01-07 DIAGNOSIS — C8333 Diffuse large B-cell lymphoma, intra-abdominal lymph nodes: Secondary | ICD-10-CM | POA: Diagnosis not present

## 2019-01-07 DIAGNOSIS — E1122 Type 2 diabetes mellitus with diabetic chronic kidney disease: Secondary | ICD-10-CM | POA: Diagnosis not present

## 2019-01-07 DIAGNOSIS — I10 Essential (primary) hypertension: Secondary | ICD-10-CM | POA: Diagnosis not present

## 2019-01-07 DIAGNOSIS — Z7984 Long term (current) use of oral hypoglycemic drugs: Secondary | ICD-10-CM | POA: Diagnosis not present

## 2019-01-07 DIAGNOSIS — Z0000 Encounter for general adult medical examination without abnormal findings: Secondary | ICD-10-CM | POA: Diagnosis not present

## 2019-01-07 DIAGNOSIS — N182 Chronic kidney disease, stage 2 (mild): Secondary | ICD-10-CM | POA: Diagnosis not present

## 2019-01-07 DIAGNOSIS — E78 Disorders of lipoprotein metabolism and other lipidemias: Secondary | ICD-10-CM | POA: Diagnosis not present

## 2019-01-07 DIAGNOSIS — R310 Gross hematuria: Secondary | ICD-10-CM | POA: Diagnosis not present

## 2019-01-07 DIAGNOSIS — E1159 Type 2 diabetes mellitus with other circulatory complications: Secondary | ICD-10-CM | POA: Diagnosis not present

## 2019-01-07 DIAGNOSIS — B351 Tinea unguium: Secondary | ICD-10-CM | POA: Diagnosis not present

## 2019-01-07 DIAGNOSIS — Z23 Encounter for immunization: Secondary | ICD-10-CM | POA: Diagnosis not present

## 2019-01-15 DIAGNOSIS — L821 Other seborrheic keratosis: Secondary | ICD-10-CM | POA: Diagnosis not present

## 2019-01-15 DIAGNOSIS — Z86018 Personal history of other benign neoplasm: Secondary | ICD-10-CM | POA: Diagnosis not present

## 2019-01-15 DIAGNOSIS — Z85828 Personal history of other malignant neoplasm of skin: Secondary | ICD-10-CM | POA: Diagnosis not present

## 2019-01-15 DIAGNOSIS — L918 Other hypertrophic disorders of the skin: Secondary | ICD-10-CM | POA: Diagnosis not present

## 2019-01-15 DIAGNOSIS — L578 Other skin changes due to chronic exposure to nonionizing radiation: Secondary | ICD-10-CM | POA: Diagnosis not present

## 2019-01-15 DIAGNOSIS — Z872 Personal history of diseases of the skin and subcutaneous tissue: Secondary | ICD-10-CM | POA: Diagnosis not present

## 2019-01-29 ENCOUNTER — Ambulatory Visit (INDEPENDENT_AMBULATORY_CARE_PROVIDER_SITE_OTHER): Payer: Medicare HMO | Admitting: Urology

## 2019-01-29 ENCOUNTER — Encounter: Payer: Self-pay | Admitting: Urology

## 2019-01-29 ENCOUNTER — Other Ambulatory Visit: Payer: Self-pay

## 2019-01-29 VITALS — BP 154/84 | HR 79 | Ht 71.0 in | Wt 195.0 lb

## 2019-01-29 DIAGNOSIS — Z8546 Personal history of malignant neoplasm of prostate: Secondary | ICD-10-CM

## 2019-01-29 DIAGNOSIS — R310 Gross hematuria: Secondary | ICD-10-CM | POA: Diagnosis not present

## 2019-01-29 LAB — URINALYSIS, COMPLETE
Bilirubin, UA: NEGATIVE
Ketones, UA: NEGATIVE
Leukocytes,UA: NEGATIVE
Nitrite, UA: NEGATIVE
Protein,UA: NEGATIVE
Specific Gravity, UA: 1.02 (ref 1.005–1.030)
Urobilinogen, Ur: 0.2 mg/dL (ref 0.2–1.0)
pH, UA: 5 (ref 5.0–7.5)

## 2019-01-29 LAB — MICROSCOPIC EXAMINATION: Bacteria, UA: NONE SEEN

## 2019-01-29 LAB — BLADDER SCAN AMB NON-IMAGING: Scan Result: 0

## 2019-01-29 NOTE — Patient Instructions (Signed)
Cystoscopy Cystoscopy is a procedure that is used to help diagnose and sometimes treat conditions that affect the lower urinary tract. The lower urinary tract includes the bladder and the urethra. The urethra is the tube that drains urine from the bladder. Cystoscopy is done using a thin, tube-shaped instrument with a light and camera at the end (cystoscope). The cystoscope may be hard or flexible, depending on the goal of the procedure. The cystoscope is inserted through the urethra, into the bladder. Cystoscopy may be recommended if you have:  Urinary tract infections that keep coming back.  Blood in the urine (hematuria).  An inability to control when you urinate (urinary incontinence) or an overactive bladder.  Unusual cells found in a urine sample.  A blockage in the urethra, such as a urinary stone.  Painful urination.  An abnormality in the bladder found during an intravenous pyelogram (IVP) or CT scan. Cystoscopy may also be done to remove a sample of tissue to be examined under a microscope (biopsy). Tell a health care provider about:  Any allergies you have.  All medicines you are taking, including vitamins, herbs, eye drops, creams, and over-the-counter medicines.  Any problems you or family members have had with anesthetic medicines.  Any blood disorders you have.  Any surgeries you have had.  Any medical conditions you have.  Whether you are pregnant or may be pregnant. What are the risks? Generally, this is a safe procedure. However, problems may occur, including:  Infection.  Bleeding.  Allergic reactions to medicines.  Damage to other structures or organs. What happens before the procedure?  Ask your health care provider about: ? Changing or stopping your regular medicines. This is especially important if you are taking diabetes medicines or blood thinners. ? Taking medicines such as aspirin and ibuprofen. These medicines can thin your blood. Do not take  these medicines unless your health care provider tells you to take them. ? Taking over-the-counter medicines, vitamins, herbs, and supplements.  Follow instructions from your health care provider about eating or drinking restrictions.  Ask your health care provider what steps will be taken to help prevent infection. These may include: ? Washing skin with a germ-killing soap. ? Taking antibiotic medicine.  You may have an exam or testing, such as: ? X-rays of the bladder, urethra, or kidneys. ? Urine tests to check for signs of infection.  Plan to have someone take you home from the hospital or clinic. What happens during the procedure?   You will be given one or more of the following: ? A medicine to help you relax (sedative). ? A medicine to numb the area (local anesthetic).  The area around the opening of your urethra will be cleaned.  The cystoscope will be passed through your urethra into your bladder.  Germ-free (sterile) fluid will flow through the cystoscope to fill your bladder. The fluid will stretch your bladder so that your health care provider can clearly examine your bladder walls.  Your doctor will look at the urethra and bladder. Your doctor may take a biopsy or remove stones.  The cystoscope will be removed, and your bladder will be emptied. The procedure may vary among health care providers and hospitals. What can I expect after the procedure? After the procedure, it is common to have:  Some soreness or pain in your abdomen and urethra.  Urinary symptoms. These include: ? Mild pain or burning when you urinate. Pain should stop within a few minutes after you urinate. This   may last for up to 1 week. ? A small amount of blood in your urine for several days. ? Feeling like you need to urinate but producing only a small amount of urine. Follow these instructions at home: Medicines  Take over-the-counter and prescription medicines only as told by your health care  provider.  If you were prescribed an antibiotic medicine, take it as told by your health care provider. Do not stop taking the antibiotic even if you start to feel better. General instructions  Return to your normal activities as told by your health care provider. Ask your health care provider what activities are safe for you.  Do not drive for 24 hours if you were given a sedative during your procedure.  Watch for any blood in your urine. If the amount of blood in your urine increases, call your health care provider.  Follow instructions from your health care provider about eating or drinking restrictions.  If a tissue sample was removed for testing (biopsy) during your procedure, it is up to you to get your test results. Ask your health care provider, or the department that is doing the test, when your results will be ready.  Drink enough fluid to keep your urine pale yellow.  Keep all follow-up visits as told by your health care provider. This is important. Contact a health care provider if you:  Have pain that gets worse or does not get better with medicine, especially pain when you urinate.  Have trouble urinating.  Have more blood in your urine. Get help right away if you:  Have blood clots in your urine.  Have abdominal pain.  Have a fever or chills.  Are unable to urinate. Summary  Cystoscopy is a procedure that is used to help diagnose and sometimes treat conditions that affect the lower urinary tract.  Cystoscopy is done using a thin, tube-shaped instrument with a light and camera at the end.  After the procedure, it is common to have some soreness or pain in your abdomen and urethra.  Watch for any blood in your urine. If the amount of blood in your urine increases, call your health care provider.  If you were prescribed an antibiotic medicine, take it as told by your health care provider. Do not stop taking the antibiotic even if you start to feel better. This  information is not intended to replace advice given to you by your health care provider. Make sure you discuss any questions you have with your health care provider. Document Released: 02/05/2000 Document Revised: 01/30/2018 Document Reviewed: 01/30/2018 Elsevier Patient Education  2020 [employer]  

## 2019-01-29 NOTE — Progress Notes (Signed)
01/29/2019 2:02 PM   [name] [id number] [id number]  Referring provider: Ezequiel Kayser, MD [facility] [place],  [place] [address]  Chief Complaint  Patient presents with  . Hematuria    HPI: 78 year old male last seen in the office in 2018 returns today for evaluation of gross hematuria.  He has a known history of prostate cancer status post prostatectomy om 1999 was noted to have a rising PSA consistent with biochemical recurrence in 2018.  He underwent salvage radiation for this.  As part of his work-up for this, he had staging which indicated lymphadenopathy and was ultimately diagnosed with diffuse B-cell lymphoma.  He is under the care of Dr. Janese Banks and undergoes routine staging imaging.  Since have not radiation, his PSAs remain undetectable.  This was last drawn in 09/2018.  He had a CT scan in 10/08/2018 of the abdomen pelvis with contrast.  This showed no GU pathology.  Prostate was surgically absent.  He has noticed a few occasions off and on over the past several months where his urine appears somewhat darker, red-tinged in the evenings or afternoons.  He feels that this is likely related to overactivity or strenuous activity.  He is not any overt clots.  It is nonpainful and he is otherwise asymptomatic from this.  No urinary symptoms.  No dysuria.  Urinalysis on 01/07/2019 did show 10-50 red blood cells per high-powered field.  UA was otherwise unremarkable.  UA today with 5-10 red cells per high-power field, otherwise unremarkable.   PMH: Past Medical History:  Diagnosis Date  . Arthritis   . Basal cell carcinoma 2008   forehead  . Chronic renal insufficiency, stage III (moderate)   . Colon cancer ([place]) 1998  . Diabetes mellitus without complication ([facility])    Controlled with diet only as of 11/01/2016  . Diffuse large B cell lymphoma ([place])   . H/O onychomycosis   . History of kidney stones   . Hyperlipemia   . Hypertension   . Lyme  disease   . Prostate cancer [facility]) 1999    Surgical History: Past Surgical History:  Procedure Laterality Date  . COLON SURGERY  1998  . COLONOSCOPY    . COLONOSCOPY WITH PROPOFOL N/A 12/29/2014   Procedure: COLONOSCOPY WITH PROPOFOL;  Surgeon: Lollie Sails, MD;  Location[facility] END[facility]COPY;  Service: Endoscopy;  Laterality: N/A;  . COLONOSCOPY WITH PROPOFOL N/A 09/04/2017   Procedure: COLONOSCOPY WITH PROPOFOL;  Surgeon: Lollie Sails, MD;  Location: [facility] ENDOSCOPY;  Service: Endoscopy;  Laterality: N/A;  . EYE SURGERY    . FRACTURE SURGERY    . IR FLUORO GUIDE PORT INSERTION RIGHT  09/01/2016  . IR REMOVAL TUN ACCESS W/ PORT W/O FL MOD SED  03/28/2018  . LITHOTRIPSY    . PROSTATECTOMY  1998  . TONSILLECTOMY      Home Medications:  Allergies as of 01/29/2019      Reactions   Tape Rash      Medication List       Accurate as of January 29, 2019  2:02 PM. If you have any questions, ask your nurse or doctor.        Accu-Chek FastClix Lancets Misc   atorvastatin 40 MG tablet Commonly known as: LIPITOR Take 40 mg by mouth daily.   Biotin 1000 MCG Chew Chew 2 Doses by mouth daily.   hydrochlorothiazide 25 MG tablet Commonly known as: HYDRODIURIL Take 25 mg by mouth daily.   lisinopril  10 MG tablet Commonly known as: ZESTRIL Take 10 mg by mouth daily.   metFORMIN 500 MG tablet Commonly known as: GLUCOPHAGE Take 500 mg by mouth 2 (two) times daily with a meal.   multivitamin capsule Take 1 capsule by mouth daily.   True Metrix Air Glucose Meter w/Device Kit   True Metrix Blood Glucose Test test strip Generic drug: glucose blood       Allergies:  Allergies  Allergen Reactions  . Tape Rash    Family History: Family History  Problem Relation Age of Onset  . Coronary artery disease Mother   . Diabetes Mother   . Prostate cancer Father   . Pancreatitis Father   . Bladder Cancer Sister   . Diabetes Brother   . Diabetes Brother   . Prostate cancer  Brother   . Diabetes Brother   . Diabetes Maternal Grandmother     Social History:  reports that he quit smoking about 44 years ago. His smoking use included cigarettes. He has a 8.00 pack-year smoking history. He has quit using smokeless tobacco.  His smokeless tobacco use included snuff. He reports current alcohol use. He reports that he does not use drugs.  ROS: UROLOGY Frequent Urination?: Yes Hard to postpone urination?: No Burning/pain with urination?: No Get up at night to urinate?: No Leakage of urine?: No Urine stream starts and stops?: No Trouble starting stream?: No Do you have to strain to urinate?: No Blood in urine?: Yes Urinary tract infection?: No Sexually transmitted disease?: No Injury to kidneys or bladder?: No Painful intercourse?: No Weak stream?: No Erection problems?: No Penile pain?: No  Gastrointestinal Nausea?: No Vomiting?: No Indigestion/heartburn?: No Diarrhea?: Yes Constipation?: No  Constitutional Fever: No Night sweats?: No Weight loss?: No Fatigue?: No  Skin Skin rash/lesions?: No Itching?: No  Eyes Blurred vision?: No Double vision?: No  Ears/Nose/Throat Sore throat?: No Sinus problems?: No  Hematologic/Lymphatic Swollen glands?: No Easy bruising?: No  Cardiovascular Leg swelling?: Yes Chest pain?: No  Respiratory Cough?: No Shortness of breath?: No  Endocrine Excessive thirst?: No  Musculoskeletal Back pain?: No Joint pain?: No  Neurological Headaches?: No Dizziness?: No  Psychologic Depression?: No Anxiety?: No  Physical Exam: BP (!) 154/84   Pulse 79   Ht 5' 11"  (1.803 m)   Wt 195 lb (88.5 kg)   BMI 27.20 kg/m   Constitutional:  Alert and oriented, No acute distress. HEENT: Black AT, moist mucus membranes.  Trachea midline, no masses. Cardiovascular: No clubbing, cyanosis, or edema. Respiratory: Normal respiratory effort, no increased work of breathing. Skin: No rashes, bruises or suspicious  lesions. Neurologic: Grossly intact, no focal deficits, moving all 4 extremities. Psychiatric: Normal mood and affect.  Laboratory Data: Lab Results  Component Value Date   WBC 6.2 10/11/2018   HGB 13.2 10/11/2018   HCT 39.7 10/11/2018   MCV 93.9 10/11/2018   PLT 205 10/11/2018    Lab Results  Component Value Date   CREATININE 1.04 10/11/2018    Lab Results  Component Value Date   PSA 0.61 07/11/2016    Lab Results  Component Value Date   TESTOSTERONE 454 07/11/2016    Lab Results  Component Value Date   HGBA1C 7.8 (H) 01/10/2017    Urinalysis See epic, microscopic blood only today  Pertinent Imaging: CT abdomen pelvis with contrast from 09/2018 personally reviewed, no GU pathology identified.  Prostate surgically absent.  Agree with radiologic interpretation.  Assessment & Plan:    1. Gross /  microscopic hematuria Occasional episodes of rare gross hematuria  Given his history, most likely related to possible radiation cystitis radiation changes to the bladder.  He does have very remote history of smoking.  His sister has a personal history of bladder cancer.  I recommended cystoscopy to evaluate his bladder.  Differential diagnosis was discussed.  We discussed the procedure extensively, all questions answered.  He is agreeable this plan will pursue this after the holidays.  We will hold off on any further upper tract imaging.  Most recent CT scan from a few months ago was negative.  May consider CT urogram in the future if his hematuria persists.   - Urinalysis, Complete - Bladder Scan (Post Void Residual) in office  2. History of prostate cancer PSA undetectable status post salvage radiation We will continue to follow annually   Return for cysto in Jan.  Hollice Espy, [place] [address] [place], [place] [address] [phone]

## 2019-03-06 ENCOUNTER — Other Ambulatory Visit: Payer: Self-pay | Admitting: Radiology

## 2019-03-06 ENCOUNTER — Ambulatory Visit: Payer: Medicare HMO | Admitting: Urology

## 2019-03-06 ENCOUNTER — Other Ambulatory Visit: Payer: Self-pay

## 2019-03-06 ENCOUNTER — Encounter: Payer: Self-pay | Admitting: Urology

## 2019-03-06 VITALS — BP 145/59 | HR 80 | Ht 71.0 in | Wt 197.0 lb

## 2019-03-06 DIAGNOSIS — R310 Gross hematuria: Secondary | ICD-10-CM

## 2019-03-06 DIAGNOSIS — N329 Bladder disorder, unspecified: Secondary | ICD-10-CM

## 2019-03-06 NOTE — Progress Notes (Signed)
   03/06/19  CC:  Chief Complaint  Patient presents with  . Cysto    HPI: 79 year old male with a history of prostate cancer status post prostatectomy with biochemical recurrence who underwent salvage radiation in 2018.  He is also diagnosed with incidental diffuse B-cell lymphoma.  Most recently, he reports intermittent episodes of gross hematuria for which he presents today.  Most recent imaging in the form of CT abdomen pelvis with contrast on 10/08/2018 without GU pathology.  Blood pressure (!) 145/59, pulse 80, height 5\' 11"  (1.803 m), weight 197 lb (89.4 kg). NED. A&Ox3.   No respiratory distress   Abd soft, NT, ND Normal phallus with bilateral descended testicles  Cystoscopy Procedure Note  Patient identification was confirmed, informed consent was obtained, and patient was prepped using Betadine solution.  Lidocaine jelly was administered per urethral meatus.     Pre-Procedure: - Inspection reveals a normal caliber ureteral meatus.  Procedure: The flexible cystoscope was introduced without difficulty - No urethral strictures/lesions are present. - Surgically absent prostate  - Normal bladder neck - Bilateral ureteral orifices identified - Bladder mucosa  reveals multiple mulberry shaped erythematous raised areas on left hemitrigone, bladder neck and posterior bladder wall.  Largest lesion measures approximately 1 cm.  - No bladder stones - No trabeculation  Retroflexion shows normal bladder neck   Post-Procedure: - Patient tolerated the procedure well  Assessment/ Plan:  1. Gross hematuria Cystoscopic findings more strongly favor radiation cystitis especially given his history, however underlying malignancy cannot be ruled out based on visual findings alone.  I recommended proceeding to the operating room for cystoscopy, bladder biopsy.  We can also pursue bilateral retrograde pyelogram to complete his hematuria work-up at this time.  Risk and benefits of this  were discussed in detail including risk of bleeding, infection, damage surrounding structures amongst others.  All questions answered.  Preop urine culture sent today. - Urinalysis, Complete - Urine Culture, Comprehensive  2. Lesion of bladder As above   Return in about 7 months (around 10/04/2019) for PSA.  Hollice Espy, MD

## 2019-03-06 NOTE — H&P (View-Only) (Signed)
   03/06/19  CC:  Chief Complaint  Patient presents with  . Cysto    HPI: 79 year old male with a history of prostate cancer status post prostatectomy with biochemical recurrence who underwent salvage radiation in 2018.  He is also diagnosed with incidental diffuse B-cell lymphoma.  Most recently, he reports intermittent episodes of gross hematuria for which he presents today.  Most recent imaging in the form of CT abdomen pelvis with contrast on 10/08/2018 without GU pathology.  Blood pressure (!) 145/59, pulse 80, height 5\' 11"  (1.803 m), weight 197 lb (89.4 kg). NED. A&Ox3.   No respiratory distress   Abd soft, NT, ND Normal phallus with bilateral descended testicles  Cystoscopy Procedure Note  Patient identification was confirmed, informed consent was obtained, and patient was prepped using Betadine solution.  Lidocaine jelly was administered per urethral meatus.     Pre-Procedure: - Inspection reveals a normal caliber ureteral meatus.  Procedure: The flexible cystoscope was introduced without difficulty - No urethral strictures/lesions are present. - Surgically absent prostate  - Normal bladder neck - Bilateral ureteral orifices identified - Bladder mucosa  reveals multiple mulberry shaped erythematous raised areas on left hemitrigone, bladder neck and posterior bladder wall.  Largest lesion measures approximately 1 cm.  - No bladder stones - No trabeculation  Retroflexion shows normal bladder neck   Post-Procedure: - Patient tolerated the procedure well  Assessment/ Plan:  1. Gross hematuria Cystoscopic findings more strongly favor radiation cystitis especially given his history, however underlying malignancy cannot be ruled out based on visual findings alone.  I recommended proceeding to the operating room for cystoscopy, bladder biopsy.  We can also pursue bilateral retrograde pyelogram to complete his hematuria work-up at this time.  Risk and benefits of this  were discussed in detail including risk of bleeding, infection, damage surrounding structures amongst others.  All questions answered.  Preop urine culture sent today. - Urinalysis, Complete - Urine Culture, Comprehensive  2. Lesion of bladder As above   Return in about 7 months (around 10/04/2019) for PSA.  Hollice Espy, MD

## 2019-03-07 LAB — URINALYSIS, COMPLETE
Bilirubin, UA: NEGATIVE
Glucose, UA: NEGATIVE
Leukocytes,UA: NEGATIVE
Nitrite, UA: NEGATIVE
Protein,UA: NEGATIVE
RBC, UA: NEGATIVE
Specific Gravity, UA: 1.025 (ref 1.005–1.030)
Urobilinogen, Ur: 0.2 mg/dL (ref 0.2–1.0)
pH, UA: 5 (ref 5.0–7.5)

## 2019-03-07 LAB — MICROSCOPIC EXAMINATION
Bacteria, UA: NONE SEEN
RBC, Urine: NONE SEEN /hpf (ref 0–2)

## 2019-03-10 LAB — CULTURE, URINE COMPREHENSIVE

## 2019-03-11 ENCOUNTER — Encounter
Admission: RE | Admit: 2019-03-11 | Discharge: 2019-03-11 | Disposition: A | Discharge status: Home / Self Care | Payer: Medicare HMO | Source: Ambulatory Visit | Attending: Urology | Admitting: Urology

## 2019-03-11 ENCOUNTER — Other Ambulatory Visit: Payer: Self-pay

## 2019-03-11 ENCOUNTER — Other Ambulatory Visit
Admission: RE | Admit: 2019-03-11 | Discharge: 2019-03-11 | Disposition: A | Discharge status: Home / Self Care | Payer: Medicare HMO | Source: Ambulatory Visit | Attending: Urology | Admitting: Urology

## 2019-03-11 DIAGNOSIS — E119 Type 2 diabetes mellitus without complications: Secondary | ICD-10-CM | POA: Diagnosis not present

## 2019-03-11 DIAGNOSIS — I452 Bifascicular block: Secondary | ICD-10-CM | POA: Diagnosis not present

## 2019-03-11 DIAGNOSIS — I10 Essential (primary) hypertension: Secondary | ICD-10-CM | POA: Insufficient documentation

## 2019-03-11 DIAGNOSIS — Z01818 Encounter for other preprocedural examination: Secondary | ICD-10-CM | POA: Diagnosis not present

## 2019-03-11 DIAGNOSIS — Z20822 Contact with and (suspected) exposure to covid-19: Secondary | ICD-10-CM | POA: Diagnosis not present

## 2019-03-11 LAB — CBC
HCT: 42.8 % (ref 39.0–52.0)
Hemoglobin: 14.2 g/dL (ref 13.0–17.0)
MCH: 31.3 pg (ref 26.0–34.0)
MCHC: 33.2 g/dL (ref 30.0–36.0)
MCV: 94.3 fL (ref 80.0–100.0)
Platelets: 232 10*3/uL (ref 150–400)
RBC: 4.54 MIL/uL (ref 4.22–5.81)
RDW: 12.6 % (ref 11.5–15.5)
WBC: 8.6 10*3/uL (ref 4.0–10.5)
nRBC: 0 % (ref 0.0–0.2)

## 2019-03-11 LAB — BASIC METABOLIC PANEL
Anion gap: 12 (ref 5–15)
BUN: 23 mg/dL (ref 8–23)
CO2: 27 mmol/L (ref 22–32)
Calcium: 9.5 mg/dL (ref 8.9–10.3)
Chloride: 102 mmol/L (ref 98–111)
Creatinine, Ser: 1.11 mg/dL (ref 0.61–1.24)
GFR calc Af Amer: >60 mL/min (ref >60)
GFR calc non Af Amer: >60 mL/min (ref >60)
Glucose, Bld: 129 mg/dL — ABNORMAL HIGH (ref 70–99)
Potassium: 4.7 mmol/L (ref 3.5–5.1)
Sodium: 141 mmol/L (ref 135–145)

## 2019-03-11 NOTE — Pre-Procedure Instructions (Signed)
Asked Dr.Penwarden to check EKG done today and advise per secure chat.

## 2019-03-11 NOTE — Patient Instructions (Signed)
Your procedure is scheduled on:  Thursday, January 21 Report to Day Surgery on the 2nd floor of the [facility]. To find out your arrival time, please call [phone] between 1PM - 3PM on: Wednesday, January 20  REMEMBER: Instructions that are not followed completely may result in serious medical risk, up to and including death; or upon the discretion of your surgeon and anesthesiologist your surgery may need to be rescheduled.  Do not eat food after midnight the night before surgery.  No gum chewing, lozengers or hard candies.  You may however, drink water up to 2 hours before you are scheduled to arrive for your surgery. Do not drink anything within 2 hours of the start of your surgery.  No Alcohol for 24 hours before or after surgery.  No Smoking including e-cigarettes for 24 hours prior to surgery.  No chewable tobacco products for at least 6 hours prior to surgery.  No nicotine patches on the day of surgery.  On the morning of surgery brush your teeth with toothpaste and water, you may rinse your mouth with mouthwash if you wish. Do not swallow any toothpaste or mouthwash.  Notify your doctor if there is any change in your medical condition (cold, fever, infection).  Do not wear jewelry, make-up, hairpins, clips or nail polish.  Do not wear lotions, powders, or perfumes.   Do not shave 48 hours prior to surgery.   Contacts and dentures may not be worn into surgery.  Do not bring valuables to the hospital, including drivers license, insurance or credit cards.  [facility] is not responsible for any belongings or valuables.   DO NOT TAKE ANY MEDICATIONS THE MORNING OF SURGERY  Stop Metformin 2 days prior to surgery. LAST DAY TO TAKE IS Monday, January 18; RESUME AFTER SURGERY.  Stop Anti-inflammatories (NSAIDS) such as Advil, Aleve, Ibuprofen, Motrin, Naproxen, Naprosyn and Aspirin based products such as Excedrin, Goodys Powder, BC Powder. (May take Tylenol or  Acetaminophen if needed.)  Stop ANY OVER THE COUNTER supplements until after surgery. (May continue multivitamin.)  Wear comfortable clothing (specific to your surgery type) to the hospital.  If you are being discharged the day of surgery, you will not be allowed to drive home. You will need a responsible adult to drive you home and stay with you that night.   If you are taking public transportation, you will need to have a responsible adult with you. Please confirm with your physician that it is acceptable to use public transportation.   Please call [phone] if you have any questions about these instructions.

## 2019-03-11 NOTE — Pre-Procedure Instructions (Signed)
OK to proceed with surgery per Dr. Randa Lynn inspite of EKG.  Called patient to notify him to follow up with his PCP due to changes in EKG.

## 2019-03-12 ENCOUNTER — Other Ambulatory Visit
Admission: RE | Admit: 2019-03-12 | Discharge: 2019-03-12 | Disposition: A | Discharge status: Home / Self Care | Payer: Medicare HMO | Source: Ambulatory Visit | Attending: Urology | Admitting: Urology

## 2019-03-12 DIAGNOSIS — E119 Type 2 diabetes mellitus without complications: Secondary | ICD-10-CM | POA: Diagnosis not present

## 2019-03-12 DIAGNOSIS — Z01818 Encounter for other preprocedural examination: Secondary | ICD-10-CM | POA: Diagnosis not present

## 2019-03-12 DIAGNOSIS — Z20822 Contact with and (suspected) exposure to covid-19: Secondary | ICD-10-CM | POA: Diagnosis not present

## 2019-03-12 DIAGNOSIS — I452 Bifascicular block: Secondary | ICD-10-CM | POA: Diagnosis not present

## 2019-03-12 DIAGNOSIS — I10 Essential (primary) hypertension: Secondary | ICD-10-CM | POA: Diagnosis not present

## 2019-03-12 LAB — SARS CORONAVIRUS 2 (TAT 6-24 HRS): SARS Coronavirus 2: NEGATIVE

## 2019-03-14 ENCOUNTER — Ambulatory Visit
Admission: RE | Admit: 2019-03-14 | Discharge: 2019-03-14 | Disposition: A | Discharge status: Home / Self Care | Payer: Medicare HMO | Attending: Urology | Admitting: Urology

## 2019-03-14 ENCOUNTER — Ambulatory Visit: Payer: Medicare HMO

## 2019-03-14 ENCOUNTER — Encounter: Payer: Self-pay | Admitting: Urology

## 2019-03-14 ENCOUNTER — Other Ambulatory Visit: Payer: Self-pay

## 2019-03-14 ENCOUNTER — Encounter
Admission: RE | Disposition: A | Discharge status: Home / Self Care | Payer: Self-pay | Source: Home / Self Care | Attending: Urology

## 2019-03-14 ENCOUNTER — Ambulatory Visit: Payer: Medicare HMO | Admitting: Anesthesiology

## 2019-03-14 DIAGNOSIS — Z9079 Acquired absence of other genital organ(s): Secondary | ICD-10-CM | POA: Insufficient documentation

## 2019-03-14 DIAGNOSIS — Z923 Personal history of irradiation: Secondary | ICD-10-CM | POA: Insufficient documentation

## 2019-03-14 DIAGNOSIS — N3289 Other specified disorders of bladder: Secondary | ICD-10-CM

## 2019-03-14 DIAGNOSIS — Z87442 Personal history of urinary calculi: Secondary | ICD-10-CM | POA: Insufficient documentation

## 2019-03-14 DIAGNOSIS — E1122 Type 2 diabetes mellitus with diabetic chronic kidney disease: Secondary | ICD-10-CM | POA: Diagnosis not present

## 2019-03-14 DIAGNOSIS — Z8546 Personal history of malignant neoplasm of prostate: Secondary | ICD-10-CM

## 2019-03-14 DIAGNOSIS — N3041 Irradiation cystitis with hematuria: Secondary | ICD-10-CM | POA: Insufficient documentation

## 2019-03-14 DIAGNOSIS — R310 Gross hematuria: Secondary | ICD-10-CM

## 2019-03-14 DIAGNOSIS — E785 Hyperlipidemia, unspecified: Secondary | ICD-10-CM | POA: Insufficient documentation

## 2019-03-14 DIAGNOSIS — E119 Type 2 diabetes mellitus without complications: Secondary | ICD-10-CM | POA: Insufficient documentation

## 2019-03-14 DIAGNOSIS — M1990 Unspecified osteoarthritis, unspecified site: Secondary | ICD-10-CM | POA: Insufficient documentation

## 2019-03-14 DIAGNOSIS — N329 Bladder disorder, unspecified: Secondary | ICD-10-CM

## 2019-03-14 DIAGNOSIS — I10 Essential (primary) hypertension: Secondary | ICD-10-CM | POA: Insufficient documentation

## 2019-03-14 DIAGNOSIS — C8510 Unspecified B-cell lymphoma, unspecified site: Secondary | ICD-10-CM | POA: Insufficient documentation

## 2019-03-14 DIAGNOSIS — I452 Bifascicular block: Secondary | ICD-10-CM | POA: Diagnosis not present

## 2019-03-14 DIAGNOSIS — Y842 Radiological procedure and radiotherapy as the cause of abnormal reaction of the patient, or of later complication, without mention of misadventure at the time of the procedure: Secondary | ICD-10-CM | POA: Diagnosis not present

## 2019-03-14 DIAGNOSIS — N183 Chronic kidney disease, stage 3 unspecified: Secondary | ICD-10-CM | POA: Diagnosis not present

## 2019-03-14 DIAGNOSIS — Z85828 Personal history of other malignant neoplasm of skin: Secondary | ICD-10-CM | POA: Insufficient documentation

## 2019-03-14 DIAGNOSIS — Z87891 Personal history of nicotine dependence: Secondary | ICD-10-CM | POA: Diagnosis not present

## 2019-03-14 DIAGNOSIS — I129 Hypertensive chronic kidney disease with stage 1 through stage 4 chronic kidney disease, or unspecified chronic kidney disease: Secondary | ICD-10-CM | POA: Diagnosis not present

## 2019-03-14 DIAGNOSIS — R319 Hematuria, unspecified: Secondary | ICD-10-CM | POA: Diagnosis not present

## 2019-03-14 HISTORY — PX: CYSTOSCOPY WITH BIOPSY: SHX5122

## 2019-03-14 HISTORY — PX: CYSTOSCOPY W/ RETROGRADES: SHX1426

## 2019-03-14 LAB — GLUCOSE, CAPILLARY
Glucose-Capillary: 138 mg/dL — ABNORMAL HIGH (ref 70–99)
Glucose-Capillary: 148 mg/dL — ABNORMAL HIGH (ref 70–99)

## 2019-03-14 SURGERY — CYSTOSCOPY, WITH BIOPSY
Anesthesia: General

## 2019-03-14 MED ORDER — ACETAMINOPHEN 325 MG PO TABS: 325.0000 mg | ORAL_TABLET | ORAL | Status: DC | PRN

## 2019-03-14 MED ORDER — DEXAMETHASONE SODIUM PHOSPHATE 10 MG/ML IJ SOLN
INTRAMUSCULAR | Status: AC
  Filled 2019-03-14: qty 1

## 2019-03-14 MED ORDER — SODIUM CHLORIDE 0.9 % IV SOLN: INTRAVENOUS | Status: DC

## 2019-03-14 MED ORDER — ACETAMINOPHEN 10 MG/ML IV SOLN
INTRAVENOUS | Status: AC
  Filled 2019-03-14: qty 100

## 2019-03-14 MED ORDER — SODIUM CHLORIDE (PF) 0.9 % IJ SOLN
INTRAMUSCULAR | Status: AC
  Filled 2019-03-14: qty 10

## 2019-03-14 MED ORDER — FAMOTIDINE 20 MG PO TABS: 20.0000 mg | ORAL_TABLET | Freq: Once | ORAL | Status: AC

## 2019-03-14 MED ORDER — HYDROCODONE-ACETAMINOPHEN 7.5-325 MG PO TABS
1.0000 | ORAL_TABLET | Freq: Once | ORAL | Status: DC | PRN
  Filled 2019-03-14: qty 1

## 2019-03-14 MED ORDER — FENTANYL CITRATE (PF) 100 MCG/2ML IJ SOLN: 25.0000 ug | INTRAMUSCULAR | Status: DC | PRN

## 2019-03-14 MED ORDER — SEVOFLURANE IN SOLN
RESPIRATORY_TRACT | Status: AC
  Filled 2019-03-14: qty 250

## 2019-03-14 MED ORDER — CEFAZOLIN SODIUM-DEXTROSE 2-4 GM/100ML-% IV SOLN
2.0000 g | INTRAVENOUS | Status: AC
  Administered 2019-03-14: 2 g via INTRAVENOUS

## 2019-03-14 MED ORDER — EPHEDRINE SULFATE 50 MG/ML IJ SOLN
INTRAMUSCULAR | Status: AC
  Filled 2019-03-14: qty 1

## 2019-03-14 MED ORDER — EPHEDRINE SULFATE 50 MG/ML IJ SOLN
INTRAMUSCULAR | Status: DC | PRN
  Administered 2019-03-14: 5 mg via INTRAVENOUS

## 2019-03-14 MED ORDER — FENTANYL CITRATE (PF) 100 MCG/2ML IJ SOLN
INTRAMUSCULAR | Status: AC
  Filled 2019-03-14: qty 2

## 2019-03-14 MED ORDER — CEFAZOLIN SODIUM-DEXTROSE 2-4 GM/100ML-% IV SOLN
INTRAVENOUS | Status: AC
  Filled 2019-03-14: qty 100

## 2019-03-14 MED ORDER — LIDOCAINE HCL (PF) 2 % IJ SOLN
INTRAMUSCULAR | Status: AC
  Filled 2019-03-14: qty 5

## 2019-03-14 MED ORDER — PROMETHAZINE HCL 25 MG/ML IJ SOLN: 6.2500 mg | INTRAMUSCULAR | Status: DC | PRN

## 2019-03-14 MED ORDER — DEXAMETHASONE SODIUM PHOSPHATE 10 MG/ML IJ SOLN
INTRAMUSCULAR | Status: DC | PRN
  Administered 2019-03-14: 10 mg via INTRAVENOUS

## 2019-03-14 MED ORDER — ACETAMINOPHEN 160 MG/5ML PO SOLN
325.0000 mg | ORAL | Status: DC | PRN
  Filled 2019-03-14: qty 20.3

## 2019-03-14 MED ORDER — FENTANYL CITRATE (PF) 100 MCG/2ML IJ SOLN
INTRAMUSCULAR | Status: DC | PRN
  Administered 2019-03-14 (×2): 25 ug via INTRAVENOUS

## 2019-03-14 MED ORDER — ACETAMINOPHEN 10 MG/ML IV SOLN
INTRAVENOUS | Status: DC | PRN
  Administered 2019-03-14: 1000 mg via INTRAVENOUS

## 2019-03-14 MED ORDER — ONDANSETRON HCL 4 MG/2ML IJ SOLN
INTRAMUSCULAR | Status: DC | PRN
  Administered 2019-03-14: 4 mg via INTRAVENOUS

## 2019-03-14 MED ORDER — IOHEXOL 180 MG/ML  SOLN
INTRAMUSCULAR | Status: DC | PRN
  Administered 2019-03-14: 08:00:00 20 mL

## 2019-03-14 MED ORDER — LIDOCAINE HCL (CARDIAC) PF 100 MG/5ML IV SOSY
PREFILLED_SYRINGE | INTRAVENOUS | Status: DC | PRN
  Administered 2019-03-14: 80 mg via INTRAVENOUS

## 2019-03-14 MED ORDER — DEXMEDETOMIDINE HCL IN NACL 80 MCG/20ML IV SOLN
INTRAVENOUS | Status: AC
  Filled 2019-03-14: qty 20

## 2019-03-14 MED ORDER — ONDANSETRON HCL 4 MG/2ML IJ SOLN
INTRAMUSCULAR | Status: AC
  Filled 2019-03-14: qty 2

## 2019-03-14 MED ORDER — FAMOTIDINE 20 MG PO TABS
ORAL_TABLET | ORAL | Status: AC
  Administered 2019-03-14: 20 mg via ORAL
  Filled 2019-03-14: qty 1

## 2019-03-14 MED ORDER — PROPOFOL 10 MG/ML IV BOLUS
INTRAVENOUS | Status: DC | PRN
  Administered 2019-03-14: 140 mg via INTRAVENOUS

## 2019-03-14 MED ORDER — PROPOFOL 10 MG/ML IV BOLUS
INTRAVENOUS | Status: AC
  Filled 2019-03-14: qty 20

## 2019-03-14 SURGICAL SUPPLY — 23 items
BAG DRAIN CYSTO-URO LG1000N (MISCELLANEOUS) ×4 IMPLANT
BRUSH SCRUB EZ  4% CHG (MISCELLANEOUS) ×2
BRUSH SCRUB EZ 4% CHG (MISCELLANEOUS) ×2 IMPLANT
CATH URETL 5X70 OPEN END (CATHETERS) ×4 IMPLANT
DRAPE UTILITY 15X26 TOWEL STRL (DRAPES) ×4 IMPLANT
DRSG TELFA 4X3 1S NADH ST (GAUZE/BANDAGES/DRESSINGS) ×4 IMPLANT
ELECT REM PT RETURN 9FT ADLT (ELECTROSURGICAL) ×4
ELECTRODE REM PT RTRN 9FT ADLT (ELECTROSURGICAL) ×2 IMPLANT
GLOVE BIO SURGEON STRL SZ 6.5 (GLOVE) ×3 IMPLANT
GLOVE BIO SURGEONS STRL SZ 6.5 (GLOVE) ×1
GOWN STRL REUS W/ TWL LRG LVL3 (GOWN DISPOSABLE) ×4 IMPLANT
GOWN STRL REUS W/TWL LRG LVL3 (GOWN DISPOSABLE) ×4
GUIDEWIRE STR DUAL SENSOR (WIRE) ×4 IMPLANT
KIT TURNOVER CYSTO (KITS) ×4 IMPLANT
NDL SAFETY ECLIPSE 18X1.5 (NEEDLE) ×2 IMPLANT
NEEDLE HYPO 18GX1.5 SHARP (NEEDLE) ×2
PACK CYSTO AR (MISCELLANEOUS) ×4 IMPLANT
SET CYSTO W/LG BORE CLAMP LF (SET/KITS/TRAYS/PACK) ×4 IMPLANT
SOL .9 NS 3000ML IRR  AL (IV SOLUTION) ×2
SOL .9 NS 3000ML IRR UROMATIC (IV SOLUTION) ×2 IMPLANT
SURGILUBE 2OZ TUBE FLIPTOP (MISCELLANEOUS) ×4 IMPLANT
WATER STERILE IRR 1000ML POUR (IV SOLUTION) ×4 IMPLANT
WATER STERILE IRR 3000ML UROMA (IV SOLUTION) ×4 IMPLANT

## 2019-03-14 NOTE — Interval H&P Note (Signed)
Patient seen and examined.  H&P up-to-date.  No changes.  Preop urine culture negative.  Regular rate and rhythm  Clear to auscultation bilaterally.

## 2019-03-14 NOTE — Anesthesia Procedure Notes (Signed)
Procedure Name: LMA Insertion Date/Time: 03/14/2019 7:47 AM Performed by: Lia Foyer, CRNA Pre-anesthesia Checklist: Patient identified, Emergency Drugs available, Suction available and Patient being monitored Patient Re-evaluated:Patient Re-evaluated prior to induction Oxygen Delivery Method: Circle system utilized Preoxygenation: Pre-oxygenation with 100% oxygen Induction Type: IV induction Ventilation: Mask ventilation without difficulty LMA: LMA inserted LMA Size: 4.0 Tube type: Oral Number of attempts: 1 Airway Equipment and Method: Patient positioned with wedge pillow Tube secured with: Tape Dental Injury: Teeth and Oropharynx as per pre-operative assessment

## 2019-03-14 NOTE — Anesthesia Preprocedure Evaluation (Addendum)
Anesthesia Evaluation  Patient identified by MRN, date of birth, ID band Patient awake    Reviewed: Allergy & Precautions, H&P , NPO status , reviewed documented beta blocker date and time   Airway Mallampati: II  TM Distance: <3 FB Neck ROM: full    Dental  (+) Caps   Pulmonary former smoker,    Pulmonary exam normal        Cardiovascular hypertension, Normal cardiovascular exam  IMPRESSION: 1. Left ventricular ejection fraction is equal to 62.2%. 09/05/2016  Bifasicular block on recent EKG   Neuro/Psych    GI/Hepatic neg GERD  ,  Endo/Other  diabetes  Renal/GU Renal disease     Musculoskeletal  (+) Arthritis ,   Abdominal   Peds  Hematology   Anesthesia Other Findings Past Medical History: No date: Arthritis 2008: Basal cell carcinoma     Comment:  forehead No date: Chronic renal insufficiency, stage III (moderate) 1998: Colon cancer ([facility]) No date: Diabetes mellitus without complication ([facility])     Comment:  Controlled with diet only as of 11/01/2016 No date: Diffuse large B cell lymphoma ([facility]) No date: H/O onychomycosis No date: History of kidney stones No date: Hyperlipemia No date: Hypertension No date: Lyme disease 1999: Prostate cancer ([place]) Past Surgical History: 1998: COLON SURGERY No date: COLONOSCOPY 12/29/2014: COLONOSCOPY WITH PROPOFOL; N/A     Comment:  Procedure: COLONOSCOPY WITH PROPOFOL;  Surgeon: Lollie Sails, MD;  Location: [facility] ENDOSCOPY;  Service:               Endoscopy;  Laterality: N/A; 09/04/2017: COLONOSCOPY WITH PROPOFOL; N/A     Comment:  Procedure: COLONOSCOPY WITH PROPOFOL;  Surgeon:               Lollie Sails, MD;  Location: [facility] ENDOSCOPY;                Service: Endoscopy;  Laterality: N/A; No date: EYE SURGERY; Left     Comment:  MACULAR HOLE No date: EYE SURGERY; Right     Comment:  TORN RETINA No date: FRACTURE SURGERY; Left     Comment:   ARM 09/01/2016: IR FLUORO GUIDE PORT INSERTION RIGHT 03/28/2018: IR REMOVAL TUN ACCESS W/ PORT W/O FL MOD SED No date: LITHOTRIPSY 1998: PROSTATECTOMY No date: TONSILLECTOMY BMI    Body Mass Index: 26.72 kg/m     Reproductive/Obstetrics                            Anesthesia Physical Anesthesia Plan  ASA: III  Anesthesia Plan: General LMA   Post-op Pain Management:    Induction: Intravenous  PONV Risk Score and Plan: 2 and Ondansetron and Treatment may vary due to age or medical condition  Airway Management Planned: LMA  Additional Equipment:   Intra-op Plan:   Post-operative Plan: Extubation in OR  Informed Consent: I have reviewed the patients History and Physical, chart, labs and discussed the procedure including the risks, benefits and alternatives for the proposed anesthesia with the patient or authorized representative who has indicated his/her understanding and acceptance.     Dental Advisory Given  Plan Discussed with: CRNA  Anesthesia Plan Comments:        Anesthesia Quick Evaluation

## 2019-03-14 NOTE — Anesthesia Postprocedure Evaluation (Signed)
Anesthesia Post Note  Patient: Berkley Maybin  Procedure(s) Performed: CYSTOSCOPY WITH bladder BIOPSY (N/A ) CYSTOSCOPY WITH RETROGRADE PYELOGRAM (Bilateral )  Patient location during evaluation: PACU Anesthesia Type: General Level of consciousness: awake and alert Pain management: pain level controlled Vital Signs Assessment: post-procedure vital signs reviewed and stable Respiratory status: spontaneous breathing, nonlabored ventilation and respiratory function stable Cardiovascular status: blood pressure returned to baseline and stable Postop Assessment: no apparent nausea or vomiting Anesthetic complications: no     Last Vitals:  Vitals:   03/14/19 0906 03/14/19 0922  BP: 126/76 (!) 144/68  Pulse: (!) 58 64  Resp: 18 18  Temp: 36.4 C   SpO2: 100% 97%    Last Pain:  Vitals:   03/14/19 0922  TempSrc:   PainSc: 1                  Aribella Vavra Harvie Heck

## 2019-03-14 NOTE — Op Note (Signed)
Date of procedure: 03/14/19  Preoperative diagnosis:  1. History of prostate cancer status post salvage radiation 2. Bladder erythema 3. Gross hematuria  Postoperative diagnosis:  1. Same above  Procedure: 1. Cystoscopy 2. Bladder biopsy 3. Retrograde pyelogram  Surgeon: Hollice Espy, MD  Anesthesia: General  Complications: None  Intraoperative findings: Unremarkable retrograde pyelogram.  Erythematous lesions in the bladder, primarily on the left bladder neck and hemitrigone, slightly raised hypervascular mulberry type appearance.  Visually most consistent with radiation cystitis.  EBL: Minimal  Specimens: Bladder erythema  Drains: None  Indication: Christian Kelly is a 79 y.o. patient with history of multiple malignancies with a remote history of prostate cancer status post salvage radiation who is more recently developed episodes of gross hematuria.  Cystoscopic findings with bladder erythema concerning for primarily radiation cystitis although malignancy cannot be ruled out.  He presents for biopsies today..  After reviewing the management options for treatment, he elected to proceed with the above surgical procedure(s). We have discussed the potential benefits and risks of the procedure, side effects of the proposed treatment, the likelihood of the patient achieving the goals of the procedure, and any potential problems that might occur during the procedure or recuperation. Informed consent has been obtained.  Description of procedure:  The patient was taken to the operating room and general anesthesia was induced.  The patient was placed in the dorsal lithotomy position, prepped and draped in the usual sterile fashion, and preoperative antibiotics were administered. A preoperative time-out was performed.   A 21 French scope was advanced per urethra into the bladder.  Notably, the prostate was surgically absent.  The bladder was inspected and noted to have the erythematous  lesions outlined above, proximately 1 to 2 cm worth of erythema which is noncontiguous.  You noted a raised mulberry type appearance in some areas and other areas were flat and erythematous.  This is primarily situated over the left hemitrigone left bladder neck and left posterior bladder wall.    Attention was turned to performing retrograde pyelograms.  Attention was first turned to the left ureteral orifice was cannulated using a 5 [place] open-ended ureteral catheter.  Gentle retrograde pyelogram on the side was performed that revealed no hydronephrosis or filling defects.  Attention was then turned to the contralateral side the same exact procedure was performed.  This is also unremarkable.  These images were saved to PACS.  They drained promptly.  Next, cold cup biopsy forceps were used to take representative biopsies of the erythematous areas primarily again on the left hemitrigone and bladder neck.  These were passed off the field as bladder erythema.  Bugbee electrocautery was used to achieve hemostasis as well as fulgurate the entirety of the abnormal area.  Excellent hemostasis was achieved.  The bladder was then drained.  The patient was then cleaned dried, repositioned supine position, wrist myesthesia, and taken to the PACU in stable condition.  Plan: I will call Christian Kelly with his pathology results as soon as I receive him.  Follow-up will be based on these results.  He is wife are agreeable this plan.  Hollice Espy, M.D.

## 2019-03-14 NOTE — Transfer of Care (Signed)
Immediate Anesthesia Transfer of Care Note  Patient: Christian Kelly  Procedure(s) Performed: CYSTOSCOPY WITH bladder BIOPSY (N/A ) CYSTOSCOPY WITH RETROGRADE PYELOGRAM (Bilateral )  Patient Location: PACU  Anesthesia Type:General  Level of Consciousness: drowsy and patient cooperative  Airway & Oxygen Therapy: Patient Spontanous Breathing and Patient connected to face mask oxygen  Post-op Assessment: Report given to RN and Post -op Vital signs reviewed and stable  Post vital signs: Reviewed and stable  Last Vitals:  Vitals Value Taken Time  BP 142/80 03/14/19 0819  Temp 36.7 C 03/14/19 0819  Pulse 64 03/14/19 0821  Resp 12 03/14/19 0821  SpO2 99 % 03/14/19 0821  Vitals shown include unvalidated device data.  Last Pain:  Vitals:   03/14/19 0819  TempSrc:   PainSc: Asleep      Patients Stated Pain Goal: 0 ([id number])  Complications: No apparent anesthesia complications

## 2019-03-14 NOTE — Discharge Instructions (Addendum)
Transurethral Resection of Bladder Tumor (TURBT) or Bladder Biopsy ° ° °Definition: ° Transurethral Resection of the Bladder Tumor is a surgical procedure used to diagnose and remove tumors within the bladder. TURBT is the most common treatment for early stage bladder cancer. ° °General instructions: °   ° Your recent bladder surgery requires very little post hospital care but some definite precautions. ° °Despite the fact that no skin incisions were used, the area around the bladder incisions are raw and covered with scabs to promote healing and prevent bleeding. Certain precautions are needed to insure that the scabs are not disturbed over the next 2-4 weeks while the healing proceeds. ° °Because the raw surface inside your bladder and the irritating effects of urine you may expect frequency of urination and/or urgency (a stronger desire to urinate) and perhaps even getting up at night more often. This will usually resolve or improve slowly over the healing period. You may see some blood in your urine over the first 6 weeks. Do not be alarmed, even if the urine was clear for a while. Get off your feet and drink lots of fluids until clearing occurs. If you start to pass clots or don't improve call us. ° °Diet: ° °You may return to your normal diet immediately. Because of the raw surface of your bladder, alcohol, spicy foods, foods high in acid and drinks with caffeine may cause irritation or frequency and should be used in moderation. To keep your urine flowing freely and avoid constipation, drink plenty of fluids during the day (8-10 glasses). Tip: Avoid cranberry juice because it is very acidic. ° °Activity: ° °Your physical activity doesn't need to be restricted. However, if you are very active, you may see some blood in the urine. We suggest that you reduce your activity under the circumstances until the bleeding has stopped. ° °Bowels: ° °It is important to keep your bowels regular during the postoperative  period. Straining with bowel movements can cause bleeding. A bowel movement every other day is reasonable. Use a mild laxative if needed, such as milk of magnesia 2-3 tablespoons, or 2 Dulcolax tablets. Call if you continue to have problems. If you had been taking narcotics for pain, before, during or after your surgery, you may be constipated. Take a laxative if necessary. ° ° ° °Medication: ° °You should resume your pre-surgery medications unless told not to. In addition you may be given an antibiotic to prevent or treat infection. Antibiotics are not always necessary. All medication should be taken as prescribed until the bottles are finished unless you are having an unusual reaction to one of the drugs. ° ° °[facility] °[place], [place] [address] °[phone] ° ° ° °AMBULATORY SURGERY  °DISCHARGE INSTRUCTIONS ° ° °1) The drugs that you were given will stay in your system until tomorrow so for the next 24 hours you should not: ° °A) Drive an automobile °B) Make any legal decisions °C) Drink any alcoholic beverage ° ° °2) You may resume regular meals tomorrow.  Today it is better to start with liquids and gradually work up to solid foods. ° °You may eat anything you prefer, but it is better to start with liquids, then soup and crackers, and gradually work up to solid foods. ° ° °3) Please notify your doctor immediately if you have any unusual bleeding, trouble breathing, redness and pain at the surgery site, drainage, fever, or pain not relieved by medication. ° ° ° °4) Additional Instructions: ° ° ° ° ° ° ° °  Please contact your physician with any problems or Same Day Surgery at [phone], Monday through Friday 6 am to 4 pm, or  at [facility] number at [phone]. ° °

## 2019-03-15 ENCOUNTER — Telehealth: Payer: Self-pay | Admitting: *Deleted

## 2019-03-15 LAB — SURGICAL PATHOLOGY

## 2019-03-15 NOTE — Telephone Encounter (Addendum)
Left patient a VM asked to return call.    ----- Message from Hollice Espy, MD sent at 03/15/2019 12:16 PM EST ----- Amazing news, biopsy of the bladder was consistent with radiation changes are suspected.  There is no cancer.  We will plan for 90-month follow-up as scheduled.  If you have any questions or concerns, please reach out to our office via MyChart or call.  Hollice Espy, MD

## 2019-03-15 NOTE — Telephone Encounter (Signed)
Pt called office and I read message from Dr Brandon. °

## 2019-04-09 ENCOUNTER — Telehealth: Payer: Self-pay | Admitting: *Deleted

## 2019-04-09 NOTE — Telephone Encounter (Signed)
Received phone call from patient. He was seen in 2018 for 1 visit for Diabetes education but couldn't attend classes due to other health issues (cancer and receiving treatments). He is concerned that his fasting numbers are now rising. His fasting blood sugars were 120-130's but now since Jan they are 140-150's. He is taking Metformin 500 mg twice daily. Discussed his supper meals and instructed him to try a small bedtime snack with 1 protein and 1 carbohydrate (peanut butter crackers) for a few nights to see if this improves his fasting numbers. Discouraged fruit at bedtime. Also instructed him to check some readings 2 hrs after supper and reviewed ADA goals. He has not been exercising much in the last few months. Discussed that MD may need to increase or add additional medication due to progression of diabetes. His last A1C was 7.9 %. He sees PCP in April. He will try these suggestions and call back for any further questions.

## 2019-04-12 ENCOUNTER — Other Ambulatory Visit: Payer: Self-pay

## 2019-04-12 ENCOUNTER — Encounter: Payer: Self-pay | Admitting: Oncology

## 2019-04-12 NOTE — Progress Notes (Signed)
Patient stated that he had been doing well with no complaints. Patient wanted the physician to know that he had received his first COVID-19 vaccine two-three weeks ago and he will receive his second one tomorrow.

## 2019-04-15 ENCOUNTER — Inpatient Hospital Stay (HOSPITAL_BASED_OUTPATIENT_CLINIC_OR_DEPARTMENT_OTHER): Payer: Medicare HMO | Admitting: Oncology

## 2019-04-15 ENCOUNTER — Other Ambulatory Visit: Payer: Self-pay

## 2019-04-15 ENCOUNTER — Inpatient Hospital Stay: Payer: Medicare HMO | Attending: Oncology

## 2019-04-15 VITALS — BP 125/76 | HR 76 | Temp 96.8°F | Resp 16 | Wt 195.2 lb

## 2019-04-15 DIAGNOSIS — Z8579 Personal history of other malignant neoplasms of lymphoid, hematopoietic and related tissues: Secondary | ICD-10-CM | POA: Diagnosis not present

## 2019-04-15 DIAGNOSIS — Z08 Encounter for follow-up examination after completed treatment for malignant neoplasm: Secondary | ICD-10-CM | POA: Diagnosis not present

## 2019-04-15 DIAGNOSIS — C8330 Diffuse large B-cell lymphoma, unspecified site: Secondary | ICD-10-CM | POA: Diagnosis not present

## 2019-04-15 DIAGNOSIS — C61 Malignant neoplasm of prostate: Secondary | ICD-10-CM

## 2019-04-15 DIAGNOSIS — Z8546 Personal history of malignant neoplasm of prostate: Secondary | ICD-10-CM | POA: Insufficient documentation

## 2019-04-15 LAB — COMPREHENSIVE METABOLIC PANEL
ALT: 24 U/L (ref 0–44)
AST: 20 U/L (ref 15–41)
Albumin: 4 g/dL (ref 3.5–5.0)
Alkaline Phosphatase: 92 U/L (ref 38–126)
Anion gap: 11 (ref 5–15)
BUN: 25 mg/dL — ABNORMAL HIGH (ref 8–23)
CO2: 29 mmol/L (ref 22–32)
Calcium: 8.9 mg/dL (ref 8.9–10.3)
Chloride: 101 mmol/L (ref 98–111)
Creatinine, Ser: 1.19 mg/dL (ref 0.61–1.24)
GFR calc Af Amer: >60 mL/min (ref >60)
GFR calc non Af Amer: 59 mL/min — ABNORMAL LOW (ref >60)
Glucose, Bld: 170 mg/dL — ABNORMAL HIGH (ref 70–99)
Potassium: 4.1 mmol/L (ref 3.5–5.1)
Sodium: 141 mmol/L (ref 135–145)
Total Bilirubin: 0.9 mg/dL (ref 0.3–1.2)
Total Protein: 6.9 g/dL (ref 6.5–8.1)

## 2019-04-15 LAB — CBC WITH DIFFERENTIAL/PLATELET
Abs Immature Granulocytes: 0.03 10*3/uL (ref 0.00–0.07)
Basophils Absolute: 0 10*3/uL (ref 0.0–0.1)
Basophils Relative: 0 %
Eosinophils Absolute: 0.3 10*3/uL (ref 0.0–0.5)
Eosinophils Relative: 4 %
HCT: 42.3 % (ref 39.0–52.0)
Hemoglobin: 14 g/dL (ref 13.0–17.0)
Immature Granulocytes: 0 %
Lymphocytes Relative: 13 %
Lymphs Abs: 0.9 10*3/uL (ref 0.7–4.0)
MCH: 31.6 pg (ref 26.0–34.0)
MCHC: 33.1 g/dL (ref 30.0–36.0)
MCV: 95.5 fL (ref 80.0–100.0)
Monocytes Absolute: 0.7 10*3/uL (ref 0.1–1.0)
Monocytes Relative: 10 %
Neutro Abs: 5.4 10*3/uL (ref 1.7–7.7)
Neutrophils Relative %: 73 %
Platelets: 199 10*3/uL (ref 150–400)
RBC: 4.43 MIL/uL (ref 4.22–5.81)
RDW: 12.2 % (ref 11.5–15.5)
WBC: 7.4 10*3/uL (ref 4.0–10.5)
nRBC: 0 % (ref 0.0–0.2)

## 2019-04-15 LAB — LACTATE DEHYDROGENASE: LDH: 110 U/L (ref 98–192)

## 2019-04-15 LAB — PSA: Prostatic Specific Antigen: <0.01 ng/mL (ref 0.00–4.00)

## 2019-04-15 NOTE — Progress Notes (Signed)
Pt was concerned about hgb a1c and his morning sugars has been in 130 140's. Not been able to walk lately due to weather

## 2019-04-17 DIAGNOSIS — H26493 Other secondary cataract, bilateral: Secondary | ICD-10-CM | POA: Diagnosis not present

## 2019-04-17 DIAGNOSIS — H35342 Macular cyst, hole, or pseudohole, left eye: Secondary | ICD-10-CM | POA: Diagnosis not present

## 2019-04-17 DIAGNOSIS — H401131 Primary open-angle glaucoma, bilateral, mild stage: Secondary | ICD-10-CM | POA: Diagnosis not present

## 2019-04-17 DIAGNOSIS — H40019 Open angle with borderline findings, low risk, unspecified eye: Secondary | ICD-10-CM | POA: Diagnosis not present

## 2019-04-17 NOTE — Progress Notes (Signed)
Hematology/Oncology Consult note [facility]  Telephone:([phone] Fax:([phone]  Patient Care Team: Ezequiel Kayser, MD as PCP - General (Internal Medicine) Hollice Espy, MD as Consulting Physician (Urology) Sindy Guadeloupe, MD as Consulting Physician (Oncology) Noreene Filbert, MD as Referring Physician (Radiation Oncology)   Name of the patient: Christian Kelly  [id number]  [birth year]   Date of visit: 04/17/19  Diagnosis- 1.. 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 Stage II DLBCL GCB. FISH for double hit negative   Chief complaint/ Reason for visit-routine follow-up of diffuse large B-cell lymphoma  Heme/Onc history: 1. Patient is a 79yrold male with a h/o prostate cancer diagnosed in 1999s/p radical prostatectomy. Prior to that he had colon cancer in 1998 s/o surgery and adjuvant chemotherapy in 1998. With regards to [facility] cancer[facility] Patient think his Gleasons score was 7 at diagnosis. He has not required any radiation therapy or ADT so far. [facility]   2. Dr. TRaechel Achehas been monitoring his PSArecently and his recent trend of PSA has been as follows: psa was 0.33 in feb 2017 and 0.63 in April 2018. We have 1 psa from 2015 when it was 0.3  3. Patient was referred to Dr. BErlene Quanurology and Dr. cBaruch Goutyfor salvage radiation.   4. Dr. BErlene Quanordered CT abdomen which showed: IMPRESSION: 1. Status post prostatectomy, without locally recurrent disease. 2. Extensive abdominal adenopathy. Given the clinical history, most likely related to metastatic prostate carcinoma. Lymphoma could look similar. 3. Coronary artery atherosclerosis. Aortic atherosclerosis. 4. Vague upper sacral sclerosis is felt unlikely to represent metastatic disease, given lack of correlate on yesterday's  bone scan. Correlate with radiation therapy, as radiation induced necrosis could have this appearance. Recommend attention on follow-up.  5. This was followed by PET/CT scan which showed: IMPRESSION: 1. Hypermetabolic gastrohepatic ligament, abdominal retroperitoneal and small bowel mesenteric adenopathy, most indicative of lymphoma. 2. Aortic atherosclerosis (ICD10-170.0). Coronary artery Calcification.  6. Patient underwent CT-guided biopsy of the mesenteric lymph node which showed:DIAGNOSIS:  A. LYMPH NODE, MESENTERIC; CT-GUIDED CORE BIOPSY:  - LARGE B-CELL LYMPHOMA, CD10 POSITIVE.   Comment:  There is a diffuse proliferation of large lymphocytes with irregular  nuclear contours, inconspicuous nucleoli, and scant cytoplasm. The flow  cytometry and IHC results are most consistent with diffuse large B-cell  lymphoma, not otherwise specified, germinal center B-cell type. However,  it is likely that FDeltanafor MYC, BCL2 and BCL6 gene rearrangements will  be ordered, so final classification will be based on the FDaguaoresult  7. Bone marrow biopsy was negative for lymphoma  8. 17% of nuclei were positive for BCL-2 rearrangement. 35% of nuclei had extra myc signals but no gene rearrangement for cmyc noted. This was consistent with evolved lymphoma or DLBCL. Not consistentwithwith double hit or double expressor  9. Interim scans after 3 cycles of RCHOP showed: IMPRESSION: 2.2 x 5.3 cm jejunal mesenteric nodal mass along the anterior mid abdomen, significantly improved.   10.PET/CT scan after 6 cycles of therapy showed mesenteric nodule mass was 5.9 x 2.3 cm with an SUV of 2.7. This was discussed at tumor board. Likelihood of this representing residual lymphoma is low given the low SUV uptake which has been similar to prior PET scan 3 months ago. Patient then received RT to his mesenteric mass   Interval history-patient had a recent episode  of hematuria requiring cystoscopy.   Biopsy was consistent with radiation cystitis.  Otherwise patient is doing well and denies any other complaints at this time.  Appetite and weight stable.  Denies any lumps or bumps anywhere.  Denies any fatigue or night sweats.  ECOG PS- 1 Pain scale- 0  Review of systems- Review of Systems  Constitutional: Negative for chills, fever, malaise/fatigue and weight loss.  HENT: Negative for congestion, ear discharge and nosebleeds.   Eyes: Negative for blurred vision.  Respiratory: Negative for cough, hemoptysis, sputum production, shortness of breath and wheezing.   Cardiovascular: Negative for chest pain, palpitations, orthopnea and claudication.  Gastrointestinal: Negative for abdominal pain, blood in stool, constipation, diarrhea, heartburn, melena, nausea and vomiting.  Genitourinary: Negative for dysuria, flank pain, frequency, hematuria and urgency.  Musculoskeletal: Negative for back pain, joint pain and myalgias.  Skin: Negative for rash.  Neurological: Negative for dizziness, tingling, focal weakness, seizures, weakness and headaches.  Endo/Heme/Allergies: Does not bruise/bleed easily.  Psychiatric/Behavioral: Negative for depression and suicidal ideas. The patient does not have insomnia.       Allergies  Allergen Reactions  . Tape Rash     Past Medical History:  Diagnosis Date  . Arthritis   . Basal cell carcinoma 2008   forehead  . Chronic renal insufficiency, stage III (moderate)   . Colon cancer ([place]) 1998  . Diabetes mellitus without complication ([place])    Controlled with diet only as of 11/01/2016  . Diffuse large B cell lymphoma ([place])   . H/O onychomycosis   . History of kidney stones   . Hyperlipemia   . Hypertension   . Lyme disease   . Prostate cancer ([place]) 1999     Past Surgical History:  Procedure Laterality Date  . COLON SURGERY  1998  . COLONOSCOPY    . COLONOSCOPY WITH PROPOFOL N/A 12/29/2014   Procedure: COLONOSCOPY WITH PROPOFOL;  Surgeon:  Lollie Sails, MD;  Location[facility] ENDOSCOPY;  Service: Endoscopy;  Laterality: N/A;  . COLONOSCOPY WITH PROPOFOL N/A 09/04/2017   Procedure: COLONOSCOPY WITH PROPOFOL;  Surgeon: Lollie Sails, MD;  Location: [facility] ENDOSCOPY;  Service: Endoscopy;  Laterality: N/A;  . CYSTOSCOPY W/ RETROGRADES Bilateral 03/14/2019   Procedure: CYSTOSCOPY WITH RETROGRADE PYELOGRAM;  Surgeon: Hollice Espy, MD;  Location: [facility] ORS;  Service: Urology;  Laterality: Bilateral;  . CYSTOSCOPY WITH BIOPSY N/A 03/14/2019   Procedure: CYSTOSCOPY WITH bladder BIOPSY;  Surgeon: Hollice Espy, MD;  Location: [facility] ORS;  Service: Urology;  Laterality: N/A;  . EYE SURGERY Left    MACULAR HOLE  . EYE SURGERY Right    TORN RETINA  . FRACTURE SURGERY Left    ARM  . IR FLUORO GUIDE PORT INSERTION RIGHT  09/01/2016  . IR REMOVAL TUN ACCESS W/ PORT W/O FL MOD SED  03/28/2018  . LITHOTRIPSY    . PROSTATECTOMY  1998  . TONSILLECTOMY      Social History   Socioeconomic History  . Marital status: Married    Spouse name: Marcie Bal   . Number of children: 3  . Years of education: Not on file  . Highest education level: Not on file  Occupational History  . Occupation: retired     Comment: Tax adviser  Tobacco Use  . Smoking status: Former Smoker    Packs/day: 1.00    Years: 8.00    Pack years: 8.00    Types: Cigarettes    Quit date: 08/13/1974    Years since quitting: 44.7  .  Smokeless tobacco: Former Systems developer    Types: Snuff  Substance and Sexual Activity  . Alcohol use: Yes    Comment: rarely-one or two beer  . Drug use: No  . Sexual activity: Not Currently  Other Topics Concern  . Not on file  Social History Narrative  . Not on file   Social Determinants of Health   Financial Resource Strain:   . Difficulty of Paying Living Expenses: Not on file  Food Insecurity:   . Worried About Charity fundraiser in the Last Year: Not on file  . Ran Out of Food in the Last Year: Not on file  Transportation  Needs:   . Lack of Transportation (Medical): Not on file  . Lack of Transportation (Non-Medical): Not on file  Physical Activity:   . Days of Exercise per Week: Not on file  . Minutes of Exercise per Session: Not on file  Stress:   . Feeling of Stress : Not on file  Social Connections:   . Frequency of Communication with Friends and Family: Not on file  . Frequency of Social Gatherings with Friends and Family: Not on file  . Attends Religious Services: Not on file  . Active Member of Clubs or Organizations: Not on file  . Attends Archivist Meetings: Not on file  . Marital Status: Not on file  Intimate Partner Violence:   . Fear of Current or Ex-Partner: Not on file  . Emotionally Abused: Not on file  . Physically Abused: Not on file  . Sexually Abused: Not on file    Family History  Problem Relation Age of Onset  . Coronary artery disease Mother   . Diabetes Mother   . Prostate cancer Father   . Pancreatitis Father   . Bladder Cancer Sister   . Diabetes Brother   . Diabetes Brother   . Prostate cancer Brother   . Diabetes Brother   . Diabetes Maternal Grandmother      Current Outpatient Medications:  .  ACCU-CHEK FASTCLIX LANCETS MISC, , Disp: , Rfl:  .  atorvastatin (LIPITOR) 40 MG tablet, Take 40 mg by mouth at bedtime. , Disp: , Rfl:  .  Blood Glucose Monitoring Suppl (TRUE METRIX AIR GLUCOSE METER) w/Device KIT, , Disp: , Rfl:  .  hydrochlorothiazide (HYDRODIURIL) 25 MG tablet, Take 25 mg by mouth daily., Disp: , Rfl:  .  lisinopril (PRINIVIL,ZESTRIL) 10 MG tablet, Take 10 mg by mouth daily. , Disp: , Rfl:  .  metFORMIN (GLUCOPHAGE) 500 MG tablet, Take 500 mg by mouth 2 (two) times daily. , Disp: , Rfl:  .  Multiple Vitamin (MULTIVITAMIN WITH MINERALS) TABS tablet, Take 1 tablet by mouth daily., Disp: , Rfl:  .  Polyethyl Glycol-Propyl Glycol (LUBRICANT EYE DROPS) 0.4-0.3 % SOLN, Place 1 drop into both eyes 3 (three) times daily as needed (dry/irritated  eyes.)., Disp: , Rfl:  .  TRUE METRIX BLOOD GLUCOSE TEST test strip, , Disp: , Rfl:   Physical exam:  Vitals:   04/15/19 1035 04/15/19 1040  BP: 125/76   Pulse: 76   Resp: 16   Temp:  (!) 96.8 F (36 C)  TempSrc: Tympanic   Weight: 195 lb 3.2 oz (88.5 kg)    Physical Exam HENT:     Head: Normocephalic and atraumatic.  Eyes:     Pupils: Pupils are equal, round, and reactive to light.  Cardiovascular:     Rate and Rhythm: Normal rate and regular rhythm.  Heart sounds: Normal heart sounds.  Pulmonary:     Effort: Pulmonary effort is normal.     Breath sounds: Normal breath sounds.  Abdominal:     General: Bowel sounds are normal.     Palpations: Abdomen is soft.  Musculoskeletal:     Cervical back: Normal range of motion.  Lymphadenopathy:     Comments: No palpable cervical, supraclavicular, axillary or inguinal adenopathy   Skin:    General: Skin is warm and dry.  Neurological:     Mental Status: He is alert and oriented to person, place, and time.      CMP Latest Ref Rng & Units 04/15/2019  Glucose 70 - 99 mg/dL 170(H)  BUN 8 - 23 mg/dL 25(H)  Creatinine 0.61 - 1.24 mg/dL 1.19  Sodium 135 - 145 mmol/L 141  Potassium 3.5 - 5.1 mmol/L 4.1  Chloride 98 - 111 mmol/L 101  CO2 22 - 32 mmol/L 29  Calcium 8.9 - 10.3 mg/dL 8.9  Total Protein 6.5 - 8.1 g/dL 6.9  Total Bilirubin 0.3 - 1.2 mg/dL 0.9  Alkaline Phos 38 - 126 U/L 92  AST 15 - 41 U/L 20  ALT 0 - 44 U/L 24   CBC Latest Ref Rng & Units 04/15/2019  WBC 4.0 - 10.5 K/uL 7.4  Hemoglobin 13.0 - 17.0 g/dL 14.0  Hematocrit 39.0 - 52.0 % 42.3  Platelets 150 - 400 K/uL 199    No images are attached to the encounter.  No results found.   Assessment and plan- Patient is a 79 y.o. male who is here for follow-up of following issues:  1.  Stage II diffuse large B-cell lymphoma s/p 6 cycles of R-CHOP chemotherapy and radiation treatment in October 2018.  He had CT scans every 6 months up to 2 years which did not  show any growth in the residual mass which does not have significant SUV uptake.  Presently we are only continuing physical exams and consideration for scans if there are any suspicious signs or symptoms.  Patient appears to be doing well and based on today's exam no concerning signs and symptoms for recurrence.  I will see him back in 6 months with CBC CMP and LDH.  2.  Nonmetastatic prostate cancer: S/p prostatectomy in the past and received salvage radiation recently.  Also had a recent cystoscopy with biopsy which did not show any concern for recurrence.  PSA levels are less than 0.01.  Continue to monitor   Visit Diagnosis 1. Prostate cancer ([place])   2. Encounter for follow-up surveillance of diffuse large B-cell lymphoma      Dr. Randa Evens, MD, MPH [facility] at [facility] [phone] 04/17/2019 12:02 PM

## 2019-04-30 IMAGING — CT CT ABD-PELV W/ CM
2 of 5 series · 16 of 46 positions shown, 18 images · IV contrast (iopamidol)
Comparison: Bone scan of 1 day prior.

CLINICAL DATA: Colon cancer and resection with chemotherapy in
8115. Prostate cancer with resection in 7622. No complaints today.
Rising PSA.

EXAM:
CT ABDOMEN AND PELVIS WITH CONTRAST
TECHNIQUE: Multidetector CT imaging of the abdomen and pelvis was performed
using the standard protocol following bolus administration of
intravenous contrast.
CONTRAST:  100mL XHP2JP-7CC IOPAMIDOL (XHP2JP-7CC) INJECTION 61%

[Series 2: axial soft tissue · axial · 0.79mm/px · z∈[-888,-443]mm · 13 of 101 slices shown, 15 images]
[im 6/101  soft-tissue]
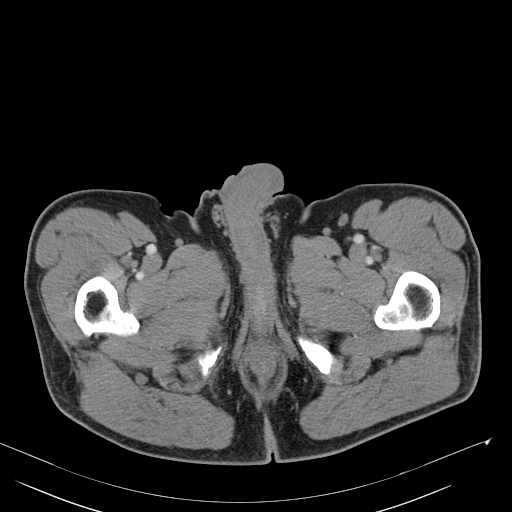
[im 6/101  bone]
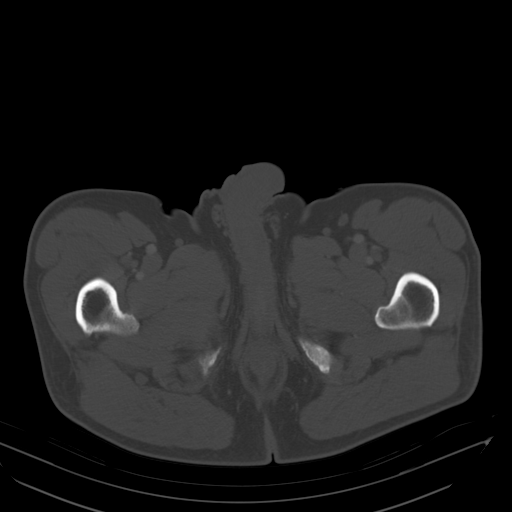
[im 16/101  soft-tissue]
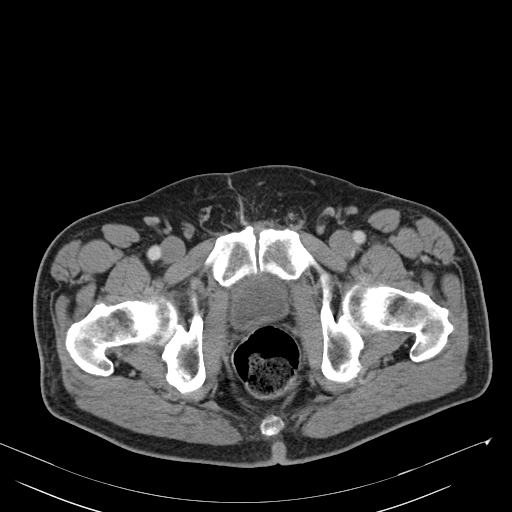
[im 22/101  soft-tissue]
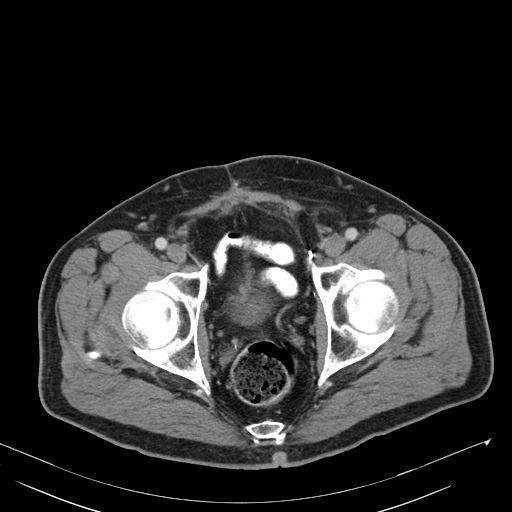
[im 27/101  soft-tissue]
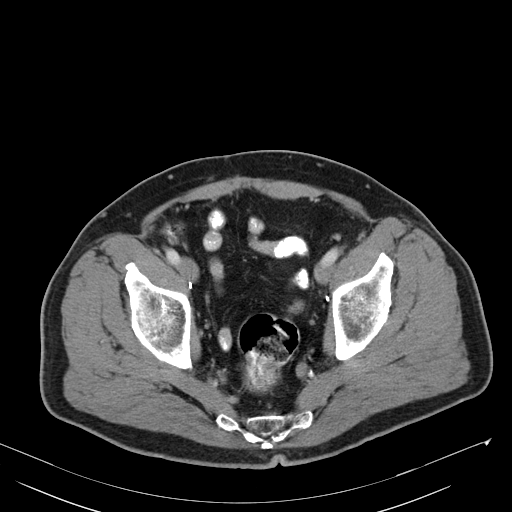
[im 37/101  soft-tissue]
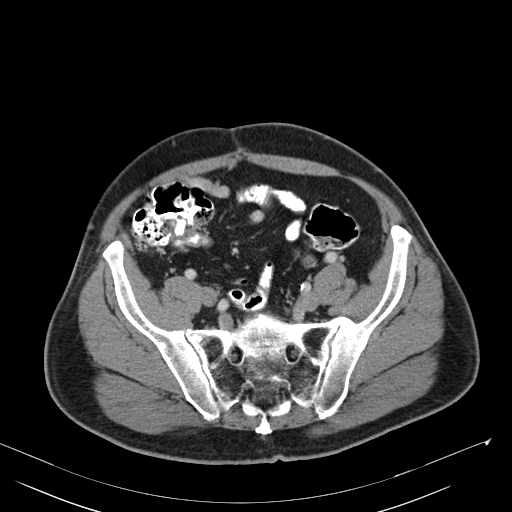
[im 43/101  soft-tissue]
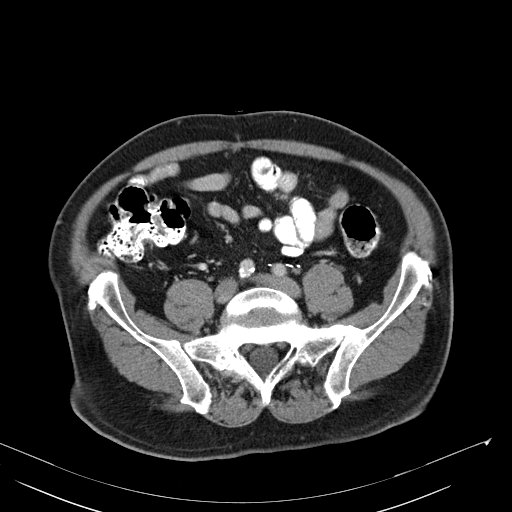
[im 53/101  soft-tissue]
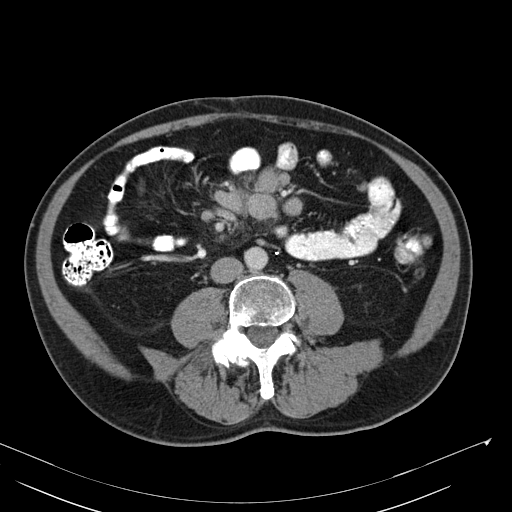
[im 58/101  soft-tissue]
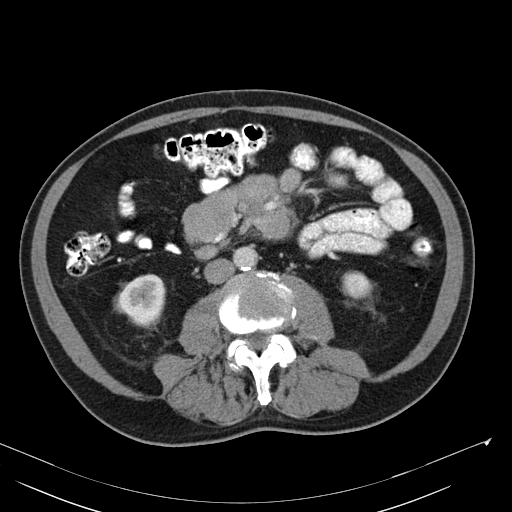
[im 64/101  soft-tissue]
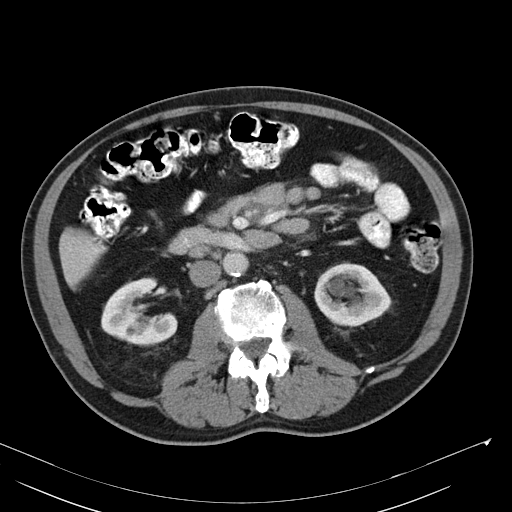
[im 64/101  bone]
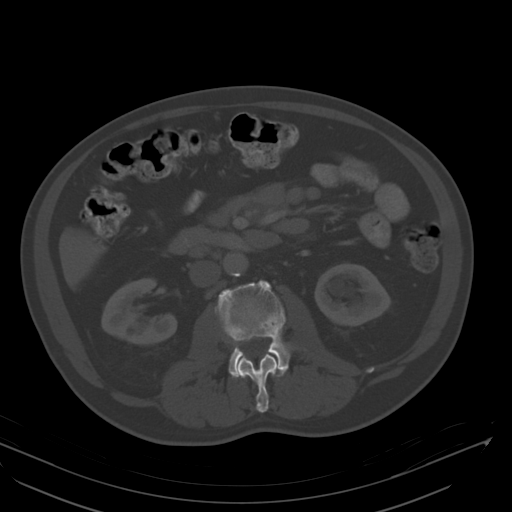
[im 74/101  soft-tissue]
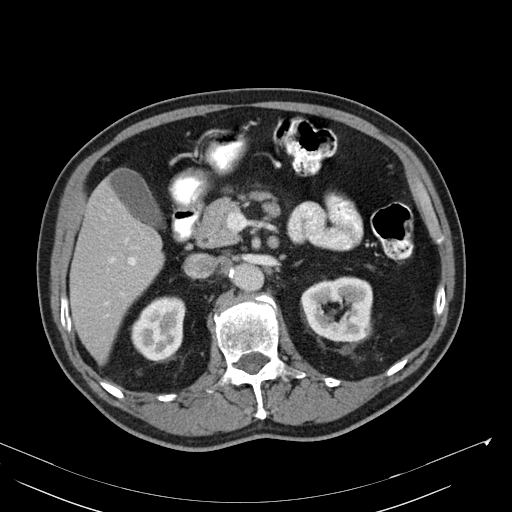
[im 79/101  soft-tissue]
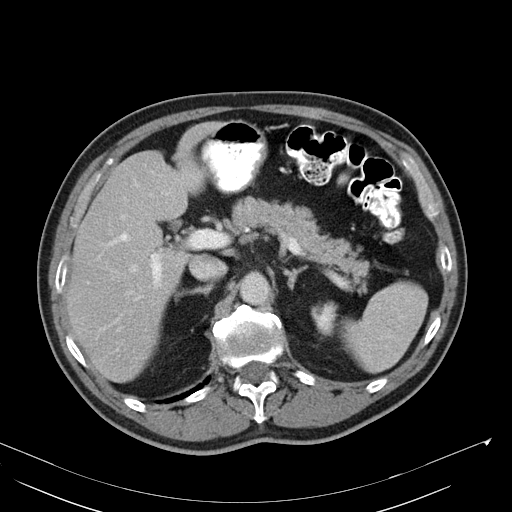
[im 85/101  soft-tissue]
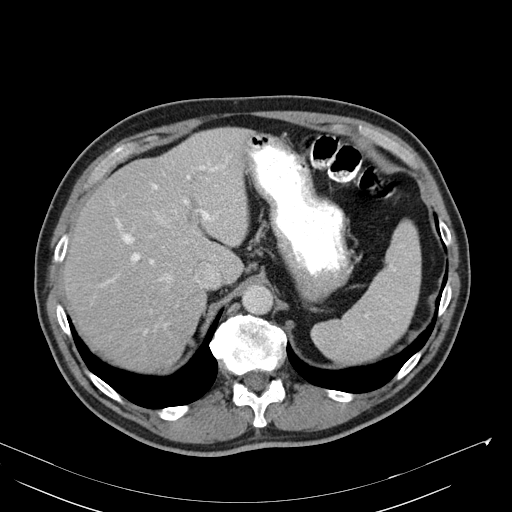
[im 95/101  soft-tissue]
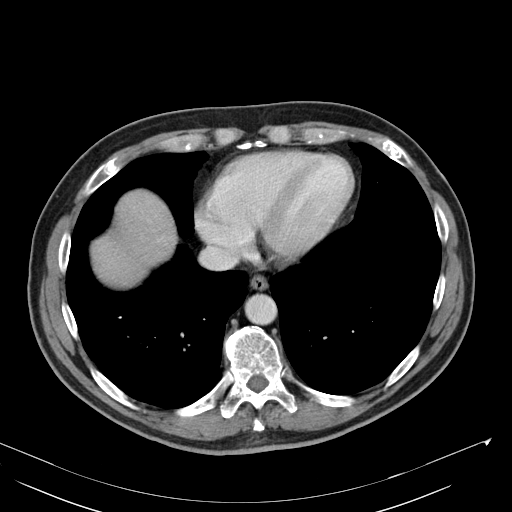

[Series 602: coronal · coronal · 0.98mm/px · 3 of 127 slices shown]
[im 43/127  soft-tissue]
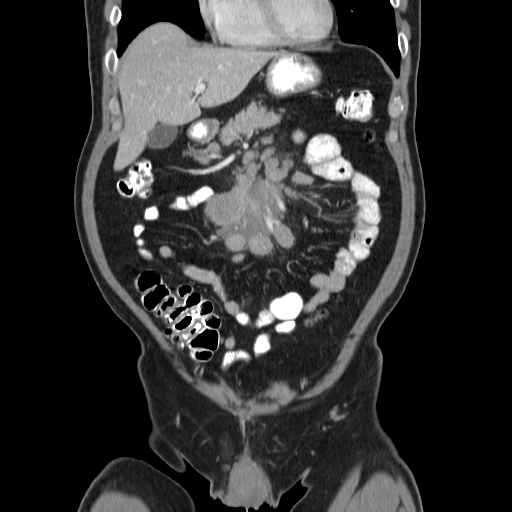
[im 57/127  soft-tissue]
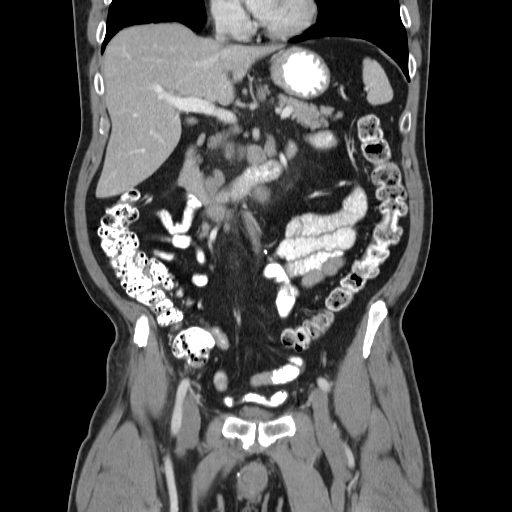
[im 71/127  soft-tissue]
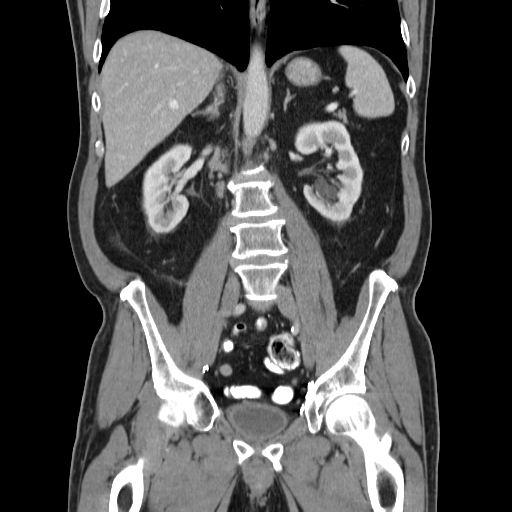

[16 of 46 positions shown; findings below may reference images not displayed]

FINDINGS: Lower chest: Clear lung bases. Normal heart size without pericardial
or pleural effusion. Right coronary artery atherosclerosis.

Hepatobiliary: Normal liver. Normal gallbladder, without biliary
ductal dilatation.

Pancreas: Normal, without mass or ductal dilatation.

Spleen: Normal in size, without focal abnormality.

Adrenals/Urinary Tract: Normal adrenal glands. Left renal cysts.
Lower pole left renal sinus cysts. Interpolar too small to
characterize renal lesion. No hydronephrosis. Normal urinary
bladder.

Stomach/Bowel: Normal stomach, without wall thickening. Normal
colon, appendix, and terminal ileum. Normal small bowel.

Vascular/Lymphatic: Aortic and branch vessel atherosclerosis.
Retroperitoneal adenopathy is mild, with an aortocaval node
measuring 10 mm on image 36/series 2. A retrocaval node measures 13
mm on image 31/series 2

A gastrohepatic ligament node is likely necrotic centrally and
measures 1.6 cm on image 19/series 2.

Extensive small bowel mesenteric adenopathy, with a nodal mass
measuring on the order of 4.0 x 7.8 cm on image 42/series 2. Prior
pelvic sidewall node dissection, without sidewall adenopathy.

Reproductive: Prostatectomy, without locally recurrent disease.

Other: No significant free fluid.

Musculoskeletal: Degenerative partial fusion of the left sacroiliac
joint. Vague sclerosis involving the upper sacrum, including on
sagittal image 86, is without correlate on bone scan.
IMPRESSION: 1. Status post prostatectomy, without locally recurrent disease.
2. Extensive abdominal adenopathy. Given the clinical history, most
likely related to metastatic prostate carcinoma. Lymphoma could look
similar.
3.  Coronary artery atherosclerosis. Aortic atherosclerosis.
4. Vague upper sacral sclerosis is felt unlikely to represent
metastatic disease, given lack of correlate on yesterday's bone
scan. Correlate with radiation therapy, as radiation induced
necrosis could have this appearance. Recommend attention on
follow-up.

## 2019-06-06 DIAGNOSIS — I129 Hypertensive chronic kidney disease with stage 1 through stage 4 chronic kidney disease, or unspecified chronic kidney disease: Secondary | ICD-10-CM | POA: Diagnosis not present

## 2019-06-06 DIAGNOSIS — E1122 Type 2 diabetes mellitus with diabetic chronic kidney disease: Secondary | ICD-10-CM | POA: Diagnosis not present

## 2019-06-06 DIAGNOSIS — Z79899 Other long term (current) drug therapy: Secondary | ICD-10-CM | POA: Diagnosis not present

## 2019-06-06 DIAGNOSIS — N3040 Irradiation cystitis without hematuria: Secondary | ICD-10-CM | POA: Diagnosis not present

## 2019-06-06 DIAGNOSIS — E1169 Type 2 diabetes mellitus with other specified complication: Secondary | ICD-10-CM | POA: Diagnosis not present

## 2019-06-06 DIAGNOSIS — E785 Hyperlipidemia, unspecified: Secondary | ICD-10-CM | POA: Diagnosis not present

## 2019-06-06 DIAGNOSIS — Z8546 Personal history of malignant neoplasm of prostate: Secondary | ICD-10-CM | POA: Diagnosis not present

## 2019-06-06 DIAGNOSIS — N183 Chronic kidney disease, stage 3 unspecified: Secondary | ICD-10-CM | POA: Diagnosis not present

## 2019-06-06 DIAGNOSIS — N182 Chronic kidney disease, stage 2 (mild): Secondary | ICD-10-CM | POA: Diagnosis not present

## 2019-06-06 DIAGNOSIS — Z8572 Personal history of non-Hodgkin lymphomas: Secondary | ICD-10-CM | POA: Diagnosis not present

## 2019-08-22 ENCOUNTER — Telehealth: Payer: Self-pay | Admitting: Oncology

## 2019-08-22 NOTE — Telephone Encounter (Signed)
Patient has appt with [facility] on 10-14-19, but MD will not be in the office. Writer phoned patient on this date and rescheduled appts for 10-18-19.

## 2019-09-30 ENCOUNTER — Other Ambulatory Visit: Payer: Medicare HMO

## 2019-10-02 ENCOUNTER — Ambulatory Visit: Payer: Medicare HMO | Admitting: Urology

## 2019-10-14 ENCOUNTER — Ambulatory Visit: Payer: Medicare HMO | Admitting: Oncology

## 2019-10-14 ENCOUNTER — Other Ambulatory Visit: Payer: Medicare HMO

## 2019-10-15 DIAGNOSIS — H401124 Primary open-angle glaucoma, left eye, indeterminate stage: Secondary | ICD-10-CM | POA: Diagnosis not present

## 2019-10-15 DIAGNOSIS — H401134 Primary open-angle glaucoma, bilateral, indeterminate stage: Secondary | ICD-10-CM | POA: Diagnosis not present

## 2019-10-15 DIAGNOSIS — H40052 Ocular hypertension, left eye: Secondary | ICD-10-CM | POA: Diagnosis not present

## 2019-10-17 ENCOUNTER — Ambulatory Visit: Payer: Medicare HMO | Admitting: Urology

## 2019-10-18 ENCOUNTER — Inpatient Hospital Stay (HOSPITAL_BASED_OUTPATIENT_CLINIC_OR_DEPARTMENT_OTHER): Payer: Medicare HMO | Admitting: Oncology

## 2019-10-18 ENCOUNTER — Other Ambulatory Visit: Payer: Self-pay

## 2019-10-18 ENCOUNTER — Encounter: Payer: Self-pay | Admitting: Oncology

## 2019-10-18 ENCOUNTER — Inpatient Hospital Stay: Payer: Medicare HMO | Attending: Oncology

## 2019-10-18 VITALS — BP 147/76 | HR 61 | Temp 96.5°F | Resp 16 | Wt 193.0 lb

## 2019-10-18 DIAGNOSIS — Z8579 Personal history of other malignant neoplasms of lymphoid, hematopoietic and related tissues: Secondary | ICD-10-CM

## 2019-10-18 DIAGNOSIS — Z08 Encounter for follow-up examination after completed treatment for malignant neoplasm: Secondary | ICD-10-CM | POA: Diagnosis not present

## 2019-10-18 DIAGNOSIS — C8333 Diffuse large B-cell lymphoma, intra-abdominal lymph nodes: Secondary | ICD-10-CM | POA: Insufficient documentation

## 2019-10-18 DIAGNOSIS — C61 Malignant neoplasm of prostate: Secondary | ICD-10-CM

## 2019-10-18 LAB — CBC WITH DIFFERENTIAL/PLATELET
Abs Immature Granulocytes: 0.04 10*3/uL (ref 0.00–0.07)
Basophils Absolute: 0.1 10*3/uL (ref 0.0–0.1)
Basophils Relative: 1 %
Eosinophils Absolute: 0.2 10*3/uL (ref 0.0–0.5)
Eosinophils Relative: 3 %
HCT: 39.4 % (ref 39.0–52.0)
Hemoglobin: 13.4 g/dL (ref 13.0–17.0)
Immature Granulocytes: 1 %
Lymphocytes Relative: 17 %
Lymphs Abs: 1.2 10*3/uL (ref 0.7–4.0)
MCH: 32 pg (ref 26.0–34.0)
MCHC: 34 g/dL (ref 30.0–36.0)
MCV: 94 fL (ref 80.0–100.0)
Monocytes Absolute: 0.7 10*3/uL (ref 0.1–1.0)
Monocytes Relative: 9 %
Neutro Abs: 5.1 10*3/uL (ref 1.7–7.7)
Neutrophils Relative %: 69 %
Platelets: 198 10*3/uL (ref 150–400)
RBC: 4.19 MIL/uL — ABNORMAL LOW (ref 4.22–5.81)
RDW: 12.6 % (ref 11.5–15.5)
WBC: 7.3 10*3/uL (ref 4.0–10.5)
nRBC: 0 % (ref 0.0–0.2)

## 2019-10-18 LAB — COMPREHENSIVE METABOLIC PANEL
ALT: 23 U/L (ref 0–44)
AST: 21 U/L (ref 15–41)
Albumin: 3.7 g/dL (ref 3.5–5.0)
Alkaline Phosphatase: 83 U/L (ref 38–126)
Anion gap: 9 (ref 5–15)
BUN: 19 mg/dL (ref 8–23)
CO2: 29 mmol/L (ref 22–32)
Calcium: 8.7 mg/dL — ABNORMAL LOW (ref 8.9–10.3)
Chloride: 103 mmol/L (ref 98–111)
Creatinine, Ser: 1.16 mg/dL (ref 0.61–1.24)
GFR calc Af Amer: >60 mL/min (ref >60)
GFR calc non Af Amer: >60 mL/min (ref >60)
Glucose, Bld: 147 mg/dL — ABNORMAL HIGH (ref 70–99)
Potassium: 4.2 mmol/L (ref 3.5–5.1)
Sodium: 141 mmol/L (ref 135–145)
Total Bilirubin: 0.6 mg/dL (ref 0.3–1.2)
Total Protein: 6.8 g/dL (ref 6.5–8.1)

## 2019-10-18 LAB — LACTATE DEHYDROGENASE: LDH: 94 U/L — ABNORMAL LOW (ref 98–192)

## 2019-10-18 LAB — PSA: Prostatic Specific Antigen: <0.01 ng/mL (ref 0.00–4.00)

## 2019-10-18 NOTE — Progress Notes (Signed)
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 Stage II DLBCL GCB. FISH for double hit negative   Chief complaint/ Reason for visit-routine follow-up of diffuse large B-cell lymphoma  Heme/Onc history: 1. Patient is a 79yrold male with a h/o prostate cancer diagnosed in 1999s/p radical prostatectomy. Prior to that he had colon cancer in 1998 s/o surgery and adjuvant chemotherapy in 1998. With regards to [facility] cancer- [facility]. Patient think his Gleasons score was 7 at diagnosis. He has not required any radiation therapy or ADT so far. [facility]   2. Dr. TRaechel Achehas been monitoring his PSArecently and his recent trend of PSA has been as follows: psa was 0.33 in feb 2017 and 0.63 in April 2018. We have 1 psa from 2015 when it was 0.3  3. Patient was referred to Dr. BErlene Quanurology and Dr. cBaruch Goutyfor salvage radiation.   4. Dr. BErlene Quanordered CT abdomen which showed: IMPRESSION: 1. Status post prostatectomy, without locally recurrent disease. 2. Extensive abdominal adenopathy. Given the clinical history, most likely related to metastatic prostate carcinoma. Lymphoma could look similar. 3. Coronary artery atherosclerosis. Aortic atherosclerosis. 4. Vague upper sacral sclerosis is felt unlikely to represent metastatic disease, given lack of correlate on yesterday's bone scan.  Correlate with radiation therapy, as radiation induced necrosis could have this appearance. Recommend attention on follow-up.  5. This was followed by PET/CT scan which showed: IMPRESSION: 1. Hypermetabolic gastrohepatic ligament, abdominal retroperitoneal and small bowel mesenteric adenopathy, most indicative of lymphoma. 2. Aortic atherosclerosis (ICD10-170.0). Coronary artery Calcification.  6. Patient underwent CT-guided biopsy of the mesenteric lymph node which showed:DIAGNOSIS:  A. LYMPH NODE, MESENTERIC; CT-GUIDED CORE BIOPSY:  - LARGE B-CELL LYMPHOMA, CD10 POSITIVE.   Comment:  There is a diffuse proliferation of large lymphocytes with irregular  nuclear contours, inconspicuous nucleoli, and scant cytoplasm. The flow  cytometry and IHC results are most consistent with diffuse large B-cell  lymphoma, not otherwise specified, germinal center B-cell type. However,  it is likely that FChelseafor MYC, BCL2 and BCL6 gene rearrangements will  be ordered, so final classification will be based on the FSan Isidroresult  7. Bone marrow biopsy was negative for lymphoma  8. 17% of nuclei were positive for BCL-2 rearrangement. 35% of nuclei had extra myc signals but no gene rearrangement for cmyc noted. This was consistent with evolved lymphoma or DLBCL. Not consistentwithwith double hit or double expressor  9. Interim scans after 3 cycles of RCHOP showed: IMPRESSION: 2.2 x 5.3 cm jejunal mesenteric nodal mass along the anterior mid abdomen, significantly improved.   10.PET/CT scan after 6 cycles of therapy showed mesenteric nodule mass was 5.9 x 2.3 cm with an SUV of 2.7. This was discussed at tumor board. Likelihood of this representing residual lymphoma is low given the low SUV uptake which has been similar to prior PET scan 3 months ago. Patient then received RT to his mesenteric mass  Interval history-he is doing well overall.  Appetite and weight have remained stable.  Denies  any significant fatigue or drenching night sweats.  Denies any lumps or bumps anywhere  ECOG PS- 1 Pain scale- 0   Review of systems- Review of Systems  Constitutional: Negative for chills, fever, malaise/fatigue and weight loss.  HENT: Negative for congestion, ear discharge and nosebleeds.   Eyes: Negative for blurred vision.  Respiratory: Negative for cough, hemoptysis, sputum production, shortness of breath and wheezing.   Cardiovascular: Negative for chest pain, palpitations, orthopnea and claudication.  Gastrointestinal: Negative for abdominal pain, blood in stool, constipation, diarrhea, heartburn, melena, nausea and vomiting.  Genitourinary: Negative for dysuria, flank pain, frequency, hematuria and urgency.  Musculoskeletal: Negative for back pain, joint pain and myalgias.  Skin: Negative for rash.  Neurological: Negative for dizziness, tingling, focal weakness, seizures, weakness and headaches.  Endo/Heme/Allergies: Does not bruise/bleed easily.  Psychiatric/Behavioral: Negative for depression and suicidal ideas. The patient does not have insomnia.       Allergies  Allergen Reactions  . Tape Rash     Past Medical History:  Diagnosis Date  . Arthritis   . Basal cell carcinoma 2008   forehead  . Chronic renal insufficiency, stage III (moderate)   . Colon cancer ([place]) 1998  . Diabetes mellitus without complication ([place])    Controlled with diet only as of 11/01/2016  . Diffuse large B cell lymphoma ([place])   . H/O onychomycosis   . History of kidney stones   . Hyperlipemia   . Hypertension   . Lyme disease   . Prostate cancer ([place]) 1999     Past Surgical History:  Procedure Laterality Date  . COLON SURGERY  1998  . COLONOSCOPY    . COLONOSCOPY WITH PROPOFOL N/A 12/29/2014   Procedure: COLONOSCOPY WITH PROPOFOL;  Surgeon: Lollie Sails, MD;  Location: [facility] ENDOSCOPY;  Service: Endoscopy;  Laterality: N/A;  . COLONOSCOPY WITH PROPOFOL N/A 09/04/2017    Procedure: COLONOSCOPY WITH PROPOFOL;  Surgeon: Lollie Sails, MD;  Location[facility] ENDOSCOPY;  Service: Endoscopy;  Laterality: N/A;  . CYSTOSCOPY W/ RETROGRADES Bilateral 03/14/2019   Procedure: CYSTOSCOPY WITH RETROGRADE PYELOGRAM;  Surgeon: Hollice Espy, MD;  Location: [facility] ORS;  Service: Urology;  Laterality: Bilateral;  . CYSTOSCOPY WITH BIOPSY N/A 03/14/2019   Procedure: CYSTOSCOPY WITH bladder BIOPSY;  Surgeon: Hollice Espy, MD;  Location: [facility] ORS;  Service: Urology;  Laterality: N/A;  . EYE SURGERY Left    MACULAR HOLE  . EYE SURGERY Right    TORN RETINA  . FRACTURE SURGERY Left    ARM  . IR FLUORO GUIDE PORT INSERTION RIGHT  09/01/2016  . IR REMOVAL TUN ACCESS W/ PORT W/O FL MOD SED  03/28/2018  . LITHOTRIPSY    . PROSTATECTOMY  1998  . TONSILLECTOMY      Social History   Socioeconomic History  . Marital status: Married    Spouse name: Marcie Bal   . Number of children: 3  . Years of education: Not on file  . Highest education level: Not on file  Occupational History  . Occupation: retired     Comment: Tax adviser  Tobacco Use  . Smoking status: Former Smoker    Packs/day: 1.00    Years: 8.00    Pack years: 8.00    Types: Cigarettes    Quit date: 08/13/1974    Years since quitting: 45.2  . Smokeless tobacco: Former Systems developer    Types: Snuff  Vaping Use  . Vaping Use: Never used  Substance  and Sexual Activity  . Alcohol use: Yes    Comment: rarely-one or two beer  . Drug use: No  . Sexual activity: Not Currently  Other Topics Concern  . Not on file  Social History Narrative  . Not on file   Social Determinants of Health   Financial Resource Strain:   . Difficulty of Paying Living Expenses: Not on file  Food Insecurity:   . Worried About Charity fundraiser in the Last Year: Not on file  . Ran Out of Food in the Last Year: Not on file  Transportation Needs:   . Lack of Transportation (Medical): Not on file  . Lack of Transportation (Non-Medical):  Not on file  Physical Activity:   . Days of Exercise per Week: Not on file  . Minutes of Exercise per Session: Not on file  Stress:   . Feeling of Stress : Not on file  Social Connections:   . Frequency of Communication with Friends and Family: Not on file  . Frequency of Social Gatherings with Friends and Family: Not on file  . Attends Religious Services: Not on file  . Active Member of Clubs or Organizations: Not on file  . Attends Archivist Meetings: Not on file  . Marital Status: Not on file  Intimate Partner Violence:   . Fear of Current or Ex-Partner: Not on file  . Emotionally Abused: Not on file  . Physically Abused: Not on file  . Sexually Abused: Not on file    Family History  Problem Relation Age of Onset  . Coronary artery disease Mother   . Diabetes Mother   . Prostate cancer Father   . Pancreatitis Father   . Bladder Cancer Sister   . Diabetes Brother   . Diabetes Brother   . Prostate cancer Brother   . Diabetes Brother   . Diabetes Maternal Grandmother      Current Outpatient Medications:  .  ACCU-CHEK FASTCLIX LANCETS MISC, , Disp: , Rfl:  .  aspirin 81 MG chewable tablet, , Disp: , Rfl:  .  atorvastatin (LIPITOR) 40 MG tablet, Take 40 mg by mouth at bedtime. , Disp: , Rfl:  .  Blood Glucose Monitoring Suppl (TRUE METRIX AIR GLUCOSE METER) w/Device KIT, , Disp: , Rfl:  .  hydrochlorothiazide (HYDRODIURIL) 25 MG tablet, Take 25 mg by mouth daily., Disp: , Rfl:  .  lisinopril (PRINIVIL,ZESTRIL) 10 MG tablet, Take 10 mg by mouth daily. , Disp: , Rfl:  .  metFORMIN (GLUCOPHAGE) 500 MG tablet, Take 500 mg by mouth 2 (two) times daily. , Disp: , Rfl:  .  Multiple Vitamin (MULTIVITAMIN WITH MINERALS) TABS tablet, Take 1 tablet by mouth daily., Disp: , Rfl:  .  Polyethyl Glycol-Propyl Glycol (LUBRICANT EYE DROPS) 0.4-0.3 % SOLN, Place 1 drop into both eyes 3 (three) times daily as needed (dry/irritated eyes.)., Disp: , Rfl:  .  TRUE METRIX BLOOD GLUCOSE  TEST test strip, , Disp: , Rfl:   Physical exam:  Vitals:   10/18/19 0950  BP: (!) 147/76  Pulse: 61  Resp: 16  Temp: (!) 96.5 F (35.8 C)  TempSrc: Tympanic  SpO2: 99%  Weight: 193 lb (87.5 kg)   Physical Exam Constitutional:      General: He is not in acute distress. Cardiovascular:     Rate and Rhythm: Normal rate and regular rhythm.     Heart sounds: Normal heart sounds.  Pulmonary:     Effort: Pulmonary effort is  normal.     Breath sounds: Normal breath sounds.  Abdominal:     General: Bowel sounds are normal.     Palpations: Abdomen is soft.  Lymphadenopathy:     Comments: No palpable cervical, supraclavicular, axillary or inguinal adenopathy   Skin:    General: Skin is warm and dry.  Neurological:     Mental Status: He is alert and oriented to person, place, and time.      CMP Latest Ref Rng & Units 10/18/2019  Glucose 70 - 99 mg/dL 147(H)  BUN 8 - 23 mg/dL 19  Creatinine 0.61 - 1.24 mg/dL 1.16  Sodium 135 - 145 mmol/L 141  Potassium 3.5 - 5.1 mmol/L 4.2  Chloride 98 - 111 mmol/L 103  CO2 22 - 32 mmol/L 29  Calcium 8.9 - 10.3 mg/dL 8.7(L)  Total Protein 6.5 - 8.1 g/dL 6.8  Total Bilirubin 0.3 - 1.2 mg/dL 0.6  Alkaline Phos 38 - 126 U/L 83  AST 15 - 41 U/L 21  ALT 0 - 44 U/L 23   CBC Latest Ref Rng & Units 10/18/2019  WBC 4.0 - 10.5 K/uL 7.3  Hemoglobin 13.0 - 17.0 g/dL 13.4  Hematocrit 39 - 52 % 39.4  Platelets 150 - 400 K/uL 198    Assessment and plan- Patient is a 79 y.o. male who is here for follow-up of following issues:  1.  Stage II diffuse large B-cell lymphoma s/p 6 cycles of R-CHOP and radiation treatment in 2018.  He is currently in CR 1.  Clinically patient is doing well with no concerning signs and symptoms on today's exam.  Labs remain unremarkable.  Continue to monitor and I will see him back in 6 months  2.  Nonmetastatic prostate cancer: S/p prostatectomy in the past and has received salvage radiation treatment. He is not currently on  ADT.  PSA from today is pending but the last PSA in February 2021 was less than 0.01.  He also continues to follow-up with urology. Visit Diagnosis 1. Prostate cancer ([place])   2. Encounter for follow-up surveillance of diffuse large B-cell lymphoma      Dr. Randa Evens, MD, MPH [facility] [phone] 10/18/2019 12:43 PM

## 2019-12-23 NOTE — Progress Notes (Signed)
12/24/2019 9:44 AM   Christian Kelly 04-11-1940 [id number]  Referring provider: Ezequiel Kayser, MD [place] [facility] [place],  [place] [address] Chief Complaint  Patient presents with  . Prostate Cancer    HPI: Christian Kelly is a 79 y.o. male who returns for a 10 month follow up of gross/microscopic hematuria and history of prostate cancer.  He has a known history of prostate cancer status post prostatectomy om 1999 was noted to have a rising PSA consistent with biochemical recurrence in 2018.  He underwent salvage radiation for this.  As part of his work-up for this, he had staging which indicated lymphadenopathy and was ultimately diagnosed with diffuse B-cell lymphoma.  He is under the care of Dr. Janese Banks and undergoes routine staging imaging.  He had a CT scan in 10/08/2018 of the abdomen pelvis with contrast.  This showed no GU pathology.  Prostate was surgically absent.  Cystoscopy on 03/06/2019 noted findings more strongly favor radiation cystitis especially given his history, however underlying malignancy cannot be ruled out based on visual findings alone.  Patient underwent a cystoscopy and bladder biopsy on 03/14/2019. Unremarkable retrograde pyelogram.  Erythematous lesions in the bladder, primarily on the left bladder neck and hemitrigone, slightly raised hypervascular mulberry type appearance.  Visually most consistent with radiation cystitis.  Pathology on 03/14/2019 revealed benign urothelial tissue with epithelial denudation, hemorrhage, hemosiderin, and cystologic atypia, compatible with radiation cystitis. Negative for malignancy.   PSA was undetectable as of 10/18/2019.  He has no gross hematuria. He has occasional hematuria after exercising. He has no bothersome urinary symptoms.   PSA trend: Component     Latest Ref Rng & Units 10/16/2017 11/30/2017 03/01/2018 10/11/2018  Prostatic Specific Antigen     0.00 - 4.00 ng/mL 0.01 <0.01 0.01 <0.01    Component     Latest Ref Rng & Units 04/15/2019  Prostatic Specific Antigen     0.00 - 4.00 ng/mL <0.01     PMH: Past Medical History:  Diagnosis Date  . Arthritis   . Basal cell carcinoma 2008   forehead  . Chronic renal insufficiency, stage III (moderate)   . Colon cancer ([place]) 1998  . Diabetes mellitus without complication ([place])    Controlled with diet only as of 11/01/2016  . Diffuse large B cell lymphoma ([place])   . H/O onychomycosis   . History of kidney stones   . Hyperlipemia   . Hypertension   . Lyme disease   . Prostate cancer [facility]) 1999    Surgical History: Past Surgical History:  Procedure Laterality Date  . COLON SURGERY  1998  . COLONOSCOPY    . COLONOSCOPY WITH PROPOFOL N/A 12/29/2014   Procedure: COLONOSCOPY WITH PROPOFOL;  Surgeon: Lollie Sails, MD;  [facility] ENDOSCOPY;  Service: Endoscopy;  Laterality: N/A;  . COLONOSCOPY WITH PROPOFOL N/A 09/04/2017   Procedure: COLONOSCOPY WITH PROPOFOL;  Surgeon: Lollie Sails, MD;  Location[facility] ENDOSCOPY;  Service: Endoscopy;  Laterality: N/A;  . CYSTOSCOPY W/ RETROGRADES Bilateral 03/14/2019   Procedure: CYSTOSCOPY WITH RETROGRADE PYELOGRAM;  Surgeon: Hollice Espy, MD;  Location: [facility] ORS;  Service: Urology;  Laterality: Bilateral;  . CYSTOSCOPY WITH BIOPSY N/A 03/14/2019   Procedure: CYSTOSCOPY WITH bladder BIOPSY;  Surgeon: Hollice Espy, MD;  Location: [facility] ORS;  Service: Urology;  Laterality: N/A;  . EYE SURGERY Left    MACULAR HOLE  . EYE SURGERY Right    TORN RETINA  . FRACTURE SURGERY Left    ARM  .  IR FLUORO GUIDE PORT INSERTION RIGHT  09/01/2016  . IR REMOVAL TUN ACCESS W/ PORT W/O FL MOD SED  03/28/2018  . LITHOTRIPSY    . PROSTATECTOMY  1998  . TONSILLECTOMY      Home Medications:  Allergies as of 12/24/2019      Reactions   Tape Rash      Medication List       Accurate as of December 24, 2019 11:59 PM. If you have any questions, ask your nurse or doctor.        Accu-Chek  FastClix Lancets Misc   aspirin 81 MG chewable tablet   atorvastatin 40 MG tablet Commonly known as: LIPITOR Take 40 mg by mouth at bedtime.   hydrochlorothiazide 25 MG tablet Commonly known as: HYDRODIURIL Take 25 mg by mouth daily.   lisinopril 10 MG tablet Commonly known as: ZESTRIL Take 10 mg by mouth daily.   Lubricant Eye Drops 0.4-0.3 % Soln Generic drug: Polyethyl Glycol-Propyl Glycol Place 1 drop into both eyes 3 (three) times daily as needed (dry/irritated eyes.).   metFORMIN 500 MG tablet Commonly known as: GLUCOPHAGE Take 500 mg by mouth 2 (two) times daily.   multivitamin with minerals Tabs tablet Take 1 tablet by mouth daily.   True Metrix Air Glucose Meter w/Device Kit   True Metrix Blood Glucose Test test strip Generic drug: glucose blood       Allergies:  Allergies  Allergen Reactions  . Tape Rash    Family History: Family History  Problem Relation Age of Onset  . Coronary artery disease Mother   . Diabetes Mother   . Prostate cancer Father   . Pancreatitis Father   . Bladder Cancer Sister   . Diabetes Brother   . Diabetes Brother   . Prostate cancer Brother   . Diabetes Brother   . Diabetes Maternal Grandmother     Social History:  reports that he quit smoking about 45 years ago. His smoking use included cigarettes. He has a 8.00 pack-year smoking history. He has quit using smokeless tobacco.  His smokeless tobacco use included snuff. He reports current alcohol use. He reports that he does not use drugs.   Physical Exam: BP 133/74   Pulse 68   Ht 6' (1.829 m)   Wt 187 lb (84.8 kg)   BMI 25.36 kg/m   Constitutional:  Alert and oriented, No acute distress. HEENT: [place] AT, moist mucus membranes.  Trachea midline, no masses. Cardiovascular: No clubbing, cyanosis, or edema. Respiratory: Normal respiratory effort, no increased work of breathing. Skin: No rashes, bruises or suspicious lesions. Neurologic: Grossly intact, no focal  deficits, moving all 4 extremities. Psychiatric: Normal mood and affect.  Laboratory Data:  Lab Results  Component Value Date   CREATININE 1.16 10/18/2019     Assessment & Plan:    1. History of gross hematuria Like secondary to radiation cystitis; biopsy was negative for malignancy. No recent episodes.  Follow up in 1 year with UA Advise patient to contact clinic if he has gross hematuria.   2. History of prostate cancer  PSA remains undetectable Continue PSA annually   Follow up in 1 year with PSA prior.   Return in about 1 year (around 12/23/2020) for 1year w/PSA &UA.  [place] [address], [place],  [address] [phone]  I, Selena Batten, am acting as a scribe for Dr. Hollice Espy.   I have reviewed the above documentation for accuracy and completeness,  and I agree with the above.   Hollice Espy, MD

## 2019-12-24 ENCOUNTER — Other Ambulatory Visit: Payer: Self-pay

## 2019-12-24 ENCOUNTER — Ambulatory Visit: Payer: Medicare HMO | Admitting: Urology

## 2019-12-24 VITALS — BP 133/74 | HR 68 | Ht 72.0 in | Wt 187.0 lb

## 2019-12-24 DIAGNOSIS — R310 Gross hematuria: Secondary | ICD-10-CM | POA: Diagnosis not present

## 2019-12-24 DIAGNOSIS — Z8546 Personal history of malignant neoplasm of prostate: Secondary | ICD-10-CM

## 2019-12-24 DIAGNOSIS — N329 Bladder disorder, unspecified: Secondary | ICD-10-CM

## 2019-12-24 DIAGNOSIS — H40052 Ocular hypertension, left eye: Secondary | ICD-10-CM | POA: Diagnosis not present

## 2019-12-24 DIAGNOSIS — H401134 Primary open-angle glaucoma, bilateral, indeterminate stage: Secondary | ICD-10-CM | POA: Diagnosis not present

## 2019-12-25 DIAGNOSIS — Z0000 Encounter for general adult medical examination without abnormal findings: Secondary | ICD-10-CM | POA: Diagnosis not present

## 2019-12-25 DIAGNOSIS — I129 Hypertensive chronic kidney disease with stage 1 through stage 4 chronic kidney disease, or unspecified chronic kidney disease: Secondary | ICD-10-CM | POA: Diagnosis not present

## 2019-12-25 DIAGNOSIS — D126 Benign neoplasm of colon, unspecified: Secondary | ICD-10-CM | POA: Diagnosis not present

## 2019-12-25 DIAGNOSIS — Z86010 Personal history of colonic polyps: Secondary | ICD-10-CM | POA: Diagnosis not present

## 2019-12-25 DIAGNOSIS — Z85038 Personal history of other malignant neoplasm of large intestine: Secondary | ICD-10-CM | POA: Diagnosis not present

## 2019-12-25 DIAGNOSIS — E1122 Type 2 diabetes mellitus with diabetic chronic kidney disease: Secondary | ICD-10-CM | POA: Diagnosis not present

## 2019-12-25 DIAGNOSIS — I700 Atherosclerosis of aorta: Secondary | ICD-10-CM | POA: Insufficient documentation

## 2019-12-25 DIAGNOSIS — N1831 Chronic kidney disease, stage 3a: Secondary | ICD-10-CM | POA: Diagnosis not present

## 2019-12-25 DIAGNOSIS — E785 Hyperlipidemia, unspecified: Secondary | ICD-10-CM | POA: Diagnosis not present

## 2019-12-25 DIAGNOSIS — N183 Chronic kidney disease, stage 3 unspecified: Secondary | ICD-10-CM | POA: Diagnosis not present

## 2019-12-25 DIAGNOSIS — E1169 Type 2 diabetes mellitus with other specified complication: Secondary | ICD-10-CM | POA: Diagnosis not present

## 2019-12-25 DIAGNOSIS — Z23 Encounter for immunization: Secondary | ICD-10-CM | POA: Diagnosis not present

## 2019-12-25 DIAGNOSIS — Z79899 Other long term (current) drug therapy: Secondary | ICD-10-CM | POA: Diagnosis not present

## 2019-12-30 DIAGNOSIS — L578 Other skin changes due to chronic exposure to nonionizing radiation: Secondary | ICD-10-CM | POA: Diagnosis not present

## 2019-12-30 DIAGNOSIS — D485 Neoplasm of uncertain behavior of skin: Secondary | ICD-10-CM | POA: Diagnosis not present

## 2019-12-30 DIAGNOSIS — Z86018 Personal history of other benign neoplasm: Secondary | ICD-10-CM | POA: Diagnosis not present

## 2019-12-30 DIAGNOSIS — Z872 Personal history of diseases of the skin and subcutaneous tissue: Secondary | ICD-10-CM | POA: Diagnosis not present

## 2019-12-30 DIAGNOSIS — B078 Other viral warts: Secondary | ICD-10-CM | POA: Diagnosis not present

## 2020-01-24 ENCOUNTER — Other Ambulatory Visit
Admission: RE | Admit: 2020-01-24 | Discharge: 2020-01-24 | Disposition: A | Discharge status: Home / Self Care | Payer: Medicare HMO | Source: Ambulatory Visit | Attending: Gastroenterology | Admitting: Gastroenterology

## 2020-01-24 ENCOUNTER — Other Ambulatory Visit: Payer: Self-pay

## 2020-01-24 DIAGNOSIS — Z20822 Contact with and (suspected) exposure to covid-19: Secondary | ICD-10-CM | POA: Insufficient documentation

## 2020-01-24 DIAGNOSIS — Z01812 Encounter for preprocedural laboratory examination: Secondary | ICD-10-CM | POA: Diagnosis not present

## 2020-01-25 LAB — SARS CORONAVIRUS 2 (TAT 6-24 HRS): SARS Coronavirus 2: NEGATIVE

## 2020-01-28 ENCOUNTER — Other Ambulatory Visit: Payer: Self-pay

## 2020-01-28 ENCOUNTER — Encounter: Payer: Self-pay | Admitting: *Deleted

## 2020-01-28 ENCOUNTER — Ambulatory Visit: Payer: Medicare HMO | Admitting: Certified Registered Nurse Anesthetist

## 2020-01-28 ENCOUNTER — Encounter
Admission: RE | Disposition: A | Discharge status: Home / Self Care | Payer: Self-pay | Source: Home / Self Care | Attending: Gastroenterology

## 2020-01-28 ENCOUNTER — Ambulatory Visit
Admission: RE | Admit: 2020-01-28 | Discharge: 2020-01-28 | Disposition: A | Discharge status: Home / Self Care | Payer: Medicare HMO | Attending: Gastroenterology | Admitting: Gastroenterology

## 2020-01-28 DIAGNOSIS — K640 First degree hemorrhoids: Secondary | ICD-10-CM | POA: Diagnosis not present

## 2020-01-28 DIAGNOSIS — Z7984 Long term (current) use of oral hypoglycemic drugs: Secondary | ICD-10-CM | POA: Diagnosis not present

## 2020-01-28 DIAGNOSIS — Z79899 Other long term (current) drug therapy: Secondary | ICD-10-CM | POA: Diagnosis not present

## 2020-01-28 DIAGNOSIS — Z9049 Acquired absence of other specified parts of digestive tract: Secondary | ICD-10-CM | POA: Insufficient documentation

## 2020-01-28 DIAGNOSIS — Z85038 Personal history of other malignant neoplasm of large intestine: Secondary | ICD-10-CM | POA: Insufficient documentation

## 2020-01-28 DIAGNOSIS — Z980 Intestinal bypass and anastomosis status: Secondary | ICD-10-CM | POA: Insufficient documentation

## 2020-01-28 DIAGNOSIS — N183 Chronic kidney disease, stage 3 unspecified: Secondary | ICD-10-CM | POA: Diagnosis not present

## 2020-01-28 DIAGNOSIS — E785 Hyperlipidemia, unspecified: Secondary | ICD-10-CM | POA: Diagnosis not present

## 2020-01-28 DIAGNOSIS — Z7982 Long term (current) use of aspirin: Secondary | ICD-10-CM | POA: Diagnosis not present

## 2020-01-28 DIAGNOSIS — K649 Unspecified hemorrhoids: Secondary | ICD-10-CM | POA: Diagnosis not present

## 2020-01-28 DIAGNOSIS — Z86010 Personal history of colonic polyps: Secondary | ICD-10-CM | POA: Diagnosis not present

## 2020-01-28 DIAGNOSIS — Z1211 Encounter for screening for malignant neoplasm of colon: Secondary | ICD-10-CM | POA: Diagnosis not present

## 2020-01-28 DIAGNOSIS — E1122 Type 2 diabetes mellitus with diabetic chronic kidney disease: Secondary | ICD-10-CM | POA: Diagnosis not present

## 2020-01-28 DIAGNOSIS — I129 Hypertensive chronic kidney disease with stage 1 through stage 4 chronic kidney disease, or unspecified chronic kidney disease: Secondary | ICD-10-CM | POA: Diagnosis not present

## 2020-01-28 HISTORY — PX: COLONOSCOPY WITH PROPOFOL: SHX5780

## 2020-01-28 SURGERY — COLONOSCOPY WITH PROPOFOL
Anesthesia: General

## 2020-01-28 MED ORDER — PHENYLEPHRINE HCL (PRESSORS) 10 MG/ML IV SOLN
INTRAVENOUS | Status: DC | PRN
  Administered 2020-01-28 (×3): 100 ug via INTRAVENOUS

## 2020-01-28 MED ORDER — PROPOFOL 500 MG/50ML IV EMUL
INTRAVENOUS | Status: DC | PRN
  Administered 2020-01-28: 99 ug/kg/min via INTRAVENOUS

## 2020-01-28 MED ORDER — PROPOFOL 10 MG/ML IV BOLUS
INTRAVENOUS | Status: DC | PRN
  Administered 2020-01-28: 50 mg via INTRAVENOUS

## 2020-01-28 MED ORDER — SODIUM CHLORIDE 0.9 % IV SOLN: INTRAVENOUS | Status: DC

## 2020-01-28 MED ORDER — LIDOCAINE HCL (PF) 2 % IJ SOLN
INTRAMUSCULAR | Status: AC
  Filled 2020-01-28: qty 5

## 2020-01-28 MED ORDER — PROPOFOL 10 MG/ML IV BOLUS
INTRAVENOUS | Status: AC
  Filled 2020-01-28: qty 120

## 2020-01-28 MED ORDER — LIDOCAINE HCL (CARDIAC) PF 100 MG/5ML IV SOSY
PREFILLED_SYRINGE | INTRAVENOUS | Status: DC | PRN
  Administered 2020-01-28: 100 mg via INTRAVENOUS

## 2020-01-28 NOTE — Progress Notes (Signed)
   01/28/20 0750  Clinical Encounter Type  Visited With Family  Visit Type Initial  Referral From Chaplain  Consult/Referral To Chaplain  While rounding SDS waiting area, chaplain visited with Pt's wife to find out her she was doing while waiting. 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 She encouraged chaplain to write her thoughts down.

## 2020-01-28 NOTE — Op Note (Signed)
[facility] Gastroenterology Patient Name: Christian Kelly Procedure Date: 01/28/2020 8:02 AM MRN: [id number] Account #: [id number] Date of Birth: 1940-07-11 Admit Type: Outpatient Age: 79 Room: [facility] ENDO ROOM 1 Gender: Male Note Status: Finalized Procedure:             Colonoscopy Indications:           High risk colon cancer surveillance: Personal history                         of colon cancer Providers:             Andrey Farmer MD, MD Referring MD:          Christena Flake. Raechel Ache, MD (Referring MD) Medicines:             Monitored Anesthesia Care Complications:         No immediate complications. Procedure:             Pre-Anesthesia Assessment:                        - Prior to the procedure, a History and Physical was                         performed, and patient medications and allergies were                         reviewed. The patient is competent. The risks and                         benefits of the procedure and the sedation options and                         risks were discussed with the patient. All questions                         were answered and informed consent was obtained.                         Patient identification and proposed procedure were                         verified by the physician, the nurse, the anesthetist                         and the technician in the endoscopy suite. Mental                         Status Examination: alert and oriented. Airway                         Examination: normal oropharyngeal airway and neck                         mobility. Respiratory Examination: clear to                         auscultation. CV Examination: normal. Prophylactic  Antibiotics: The patient does not require prophylactic                         antibiotics. Prior Anticoagulants: The patient has                         taken no previous anticoagulant or antiplatelet agents                         except for  aspirin. ASA Grade Assessment: II - A                         patient with mild systemic disease. After reviewing                         the risks and benefits, the patient was deemed in                         satisfactory condition to undergo the procedure. The                         anesthesia plan was to use monitored anesthesia care                         (MAC). Immediately prior to administration of                         medications, the patient was re-assessed for adequacy                         to receive sedatives. The heart rate, respiratory                         rate, oxygen saturations, blood pressure, adequacy of                         pulmonary ventilation, and response to care were                         monitored throughout the procedure. The physical                         status of the patient was re-assessed after the                         procedure.                        After obtaining informed consent, the colonoscope was                         passed under direct vision. Throughout the procedure,                         the patient's blood pressure, pulse, and oxygen                         saturations were monitored continuously. The  Colonoscope was introduced through the anus and                         advanced to the the cecum, identified by appendiceal                         orifice and ileocecal valve. The colonoscopy was                         performed without difficulty. The patient tolerated                         the procedure well. The quality of the bowel                         preparation was adequate to identify polyps 6 mm and                         larger in size. Findings:      The perianal and digital rectal examinations were normal.      There was evidence of a prior end-to-end colo-colonic anastomosis in the       sigmoid colon. This was patent.      Non-bleeding internal hemorrhoids were found during  retroflexion. The       hemorrhoids were Grade I (internal hemorrhoids that do not prolapse).      The exam was otherwise without abnormality on direct and retroflexion       views. Impression:            - Patent end-to-end colo-colonic anastomosis.                        - Non-bleeding internal hemorrhoids.                        - The examination was otherwise normal on direct and                         retroflexion views.                        - No specimens collected. Recommendation:        - Discharge patient to home.                        - Resume previous diet.                        - Continue present medications.                        - Repeat colonoscopy in 1 year because the bowel                         preparation was suboptimal.                        - Return to referring physician as previously                         scheduled. Procedure Code(s):     ---  Professional ---                        W8889, Colorectal cancer screening; colonoscopy on                         individual at high risk Diagnosis Code(s):     --- Professional ---                        [phone], Personal history of other malignant neoplasm                         of large intestine                        Z98.0, Intestinal bypass and anastomosis status                        K64.0, First degree hemorrhoids CPT copyright 2019 [employer]. All rights reserved. The codes documented in this report are preliminary and upon coder review may  be revised to meet current compliance requirements. Andrey Farmer, MD Andrey Farmer MD, MD 01/28/2020 8:33:36 AM Number of Addenda: 0 Note Initiated On: 01/28/2020 8:02 AM Scope Withdrawal Time: 0 hours 8 minutes 25 seconds  Total Procedure Duration: 0 hours 12 minutes 39 seconds  Estimated Blood Loss:  Estimated blood loss: none.      [facility]

## 2020-01-28 NOTE — Anesthesia Preprocedure Evaluation (Signed)
Anesthesia Evaluation  ?Patient identified by MRN, date of birth, ID band ?Patient awake ? ? ? ?Reviewed: ?Allergy & Precautions, H&P , NPO status , Patient's Chart, lab work & pertinent test results, reviewed documented beta blocker date and time  ? ?History of Anesthesia Complications ?Negative for: history of anesthetic complications ? ?Airway ?Mallampati: II ? ?TM Distance: >3 FB ?Neck ROM: full ? ? ? Dental ? ?(+) Caps, Chipped ?  ?Pulmonary ?neg shortness of breath, neg sleep apnea, neg COPD, neg recent URI, former smoker,  ?  ? ? ? ? ? ? ? Cardiovascular ?Exercise Tolerance: Good ?hypertension, On Medications ?(-) angina(-) CAD, (-) Past MI, (-) Cardiac Stents and (-) CABG (-) dysrhythmias (-) Valvular Problems/Murmurs ? ? ?  ?Neuro/Psych ?negative neurological ROS ? negative psych ROS  ? GI/Hepatic ?negative GI ROS, Fatty liver disease ?  ?Endo/Other  ?diabetes, Well Controlled, Oral Hypoglycemic Agents ? Renal/GU ?CRFRenal disease  ?negative genitourinary ?  ?Musculoskeletal ? ? Abdominal ?  ?Peds ? Hematology ?negative hematology ROS ?(+)   ?Anesthesia Other Findings ?Past Medical History: ?  Cancer (HCC)                                               ?  Basal cell carcinoma                                       ?  Chronic renal insufficiency, stage III (modera*            ?  Hypertension                                               ?  Colon cancer (HCC)                                         ?  Lyme disease                                               ?  Hyperlipemia                                               ?  Prostate cancer (HCC)                                      ? ? Reproductive/Obstetrics ?negative OB ROS ? ?  ? ? ? ? ? ? ? ? ? ? ? ? ? ?  ?  ? ? ? ? ? ? ? ? ?Anesthesia Physical ? ?Anesthesia Plan ? ?ASA: III ? ?Anesthesia Plan: General  ? ?Post-op Pain Management:   ? ?Induction: Intravenous ? ?PONV Risk Score and Plan: 2 and Propofol infusion and    TIVA ? ?Airway Management Planned: Nasal Cannula ? ?Additional Equipment:  ? ?Intra-op Plan:  ? ?Post-operative Plan:  ? ?Informed Consent: I have reviewed the patients History and Physical, chart, labs and discussed the procedure including the risks, benefits and alternatives for the proposed anesthesia with the patient or authorized representative who has indicated his/her understanding and acceptance.  ? ? ? ?Dental Advisory Given ? ?Plan Discussed with: Anesthesiologist, CRNA and Surgeon ? ?Anesthesia Plan Comments: (Patient consented for risks of anesthesia including but not limited to:  ?- adverse reactions to medications ?- risk of airway placement if required ?- damage to eyes, teeth, lips or other oral mucosa ?- nerve damage due to positioning  ?- sore throat or hoarseness ?- Damage to heart, brain, nerves, lungs, other parts of body or loss of life ? ?Patient voiced understanding.)  ? ? ? ? ? ? ?Anesthesia Quick Evaluation ? ?

## 2020-01-28 NOTE — H&P (Signed)
Outpatient short stay form Pre-procedure 01/28/2020 8:01 AM Raylene Miyamoto MD, MPH  Primary Physician: Dr. Raechel Ache  Reason for visit:  Surveillance  History of present illness:   79 y/o gentleman with history of colon cancer 20 years ago that required partial colectomy here for surveillance colonoscopy. No blood thinners. History of prostatectomy. Uncle with history of stomach cancer.    Current Facility-Administered Medications:  .  0.9 %  sodium chloride infusion, , Intravenous, Continuous, Masha Orbach, Hilton Cork, MD, Last Rate: 20 mL/hr at 01/28/20 0800, New Bag at 01/28/20 0800  Medications Prior to Admission  Medication Sig Dispense Refill Last Dose  . aspirin 81 MG chewable tablet    Past Week at Unknown time  . atorvastatin (LIPITOR) 40 MG tablet Take 40 mg by mouth at bedtime.    Past Week at Unknown time  . hydrochlorothiazide (HYDRODIURIL) 25 MG tablet Take 25 mg by mouth daily.   01/28/2020 at Unknown time  . lisinopril (PRINIVIL,ZESTRIL) 10 MG tablet Take 10 mg by mouth daily.    01/28/2020 at Unknown time  . metFORMIN (GLUCOPHAGE) 500 MG tablet Take 500 mg by mouth 2 (two) times daily.    Past Week at Unknown time  . Multiple Vitamin (MULTIVITAMIN WITH MINERALS) TABS tablet Take 1 tablet by mouth daily.   Past Week at Unknown time  . ACCU-CHEK FASTCLIX LANCETS MISC      . Blood Glucose Monitoring Suppl (TRUE METRIX AIR GLUCOSE METER) w/Device KIT      . Polyethyl Glycol-Propyl Glycol (LUBRICANT EYE DROPS) 0.4-0.3 % SOLN Place 1 drop into both eyes 3 (three) times daily as needed (dry/irritated eyes.).     Marland Kitchen TRUE METRIX BLOOD GLUCOSE TEST test strip         Allergies  Allergen Reactions  . Tape Rash     Past Medical History:  Diagnosis Date  . Arthritis   . Basal cell carcinoma 2008   forehead  . Chronic renal insufficiency, stage III (moderate) (HCC)   . Colon cancer ([place]) 1998  . Diabetes mellitus without complication ([place])    Controlled with diet only as of  11/01/2016  . Diffuse large B cell lymphoma ([place])   . H/O onychomycosis   . History of kidney stones   . Hyperlipemia   . Hypertension   . Lyme disease   . Prostate cancer [facility]) 1999    Review of systems:  Otherwise negative.    Physical Exam  Gen: Alert, oriented. Appears stated age.  HEENT: PERRLA. Lungs: No respiratory distress CV: RRR Abd: soft, benign, no masses.  Ext: No edema.     Planned procedures: Proceed with colonoscopy. The patient understands the nature of the planned procedure, indications, risks, alternatives and potential complications including but not limited to bleeding, infection, perforation, damage to internal organs and possible oversedation/side effects from anesthesia. The patient agrees and gives consent to proceed.  Please refer to procedure notes for findings, recommendations and patient disposition/instructions.     Raylene Miyamoto MD,[facility] 01/28/2020  8:01 AM

## 2020-01-28 NOTE — Interval H&P Note (Signed)
History and Physical Interval Note:  01/28/2020 8:04 AM  Christian Kelly  has presented today for surgery, with the diagnosis of history colon cancer; hx adenomatous polyps.  The various methods of treatment have been discussed with the patient and family. After consideration of risks, benefits and other options for treatment, the patient has consented to  Procedure(s): COLONOSCOPY WITH PROPOFOL (N/A) as a surgical intervention.  The patient's history has been reviewed, patient examined, no change in status, stable for surgery.  I have reviewed the patient's chart and labs.  Questions were answered to the patient's satisfaction.     Lesly Rubenstein  Ok to proceed with colonoscopy

## 2020-01-28 NOTE — Transfer of Care (Signed)
Immediate Anesthesia Transfer of Care Note  Patient: Christian Kelly  Procedure(s) Performed: COLONOSCOPY WITH PROPOFOL (N/A )  Patient Location: PACU and Endoscopy Unit  Anesthesia Type:General  Level of Consciousness: awake, drowsy and patient cooperative  Airway & Oxygen Therapy: Patient Spontanous Breathing  Post-op Assessment: Report given to RN, Post -op Vital signs reviewed and stable and Patient moving all extremities  Post vital signs: Reviewed and stable  Last Vitals:  Vitals Value Taken Time  BP 104/69 01/28/20 0834  Temp    Pulse 67 01/28/20 0833  Resp 17 01/28/20 0833  SpO2 98 % 01/28/20 0833  Vitals shown include unvalidated device data.  Last Pain:  Vitals:   01/28/20 0832  TempSrc:   PainSc: 0-No pain         Complications: No complications documented.

## 2020-01-28 NOTE — Anesthesia Postprocedure Evaluation (Signed)
Anesthesia Post Note  Patient: Christian Kelly  Procedure(s) Performed: COLONOSCOPY WITH PROPOFOL (N/A )  Patient location during evaluation: Endoscopy Anesthesia Type: General Level of consciousness: awake and alert Pain management: pain level controlled Vital Signs Assessment: post-procedure vital signs reviewed and stable Respiratory status: spontaneous breathing, nonlabored ventilation, respiratory function stable and patient connected to nasal cannula oxygen Cardiovascular status: blood pressure returned to baseline and stable Postop Assessment: no apparent nausea or vomiting Anesthetic complications: no   No complications documented.   Last Vitals:  Vitals:   01/28/20 0744 01/28/20 0832  BP: 132/80 104/69  Pulse: 73   Resp: 16   Temp: 36.4 C (!) 36.1 C  SpO2: 100%     Last Pain:  Vitals:   01/28/20 0902  TempSrc:   PainSc: 0-No pain                 Precious Haws Taylan Marez

## 2020-01-29 ENCOUNTER — Encounter: Payer: Self-pay | Admitting: Gastroenterology

## 2020-04-13 ENCOUNTER — Telehealth: Payer: Self-pay | Admitting: Oncology

## 2020-04-13 NOTE — Telephone Encounter (Signed)
04/13/2020 Left VM informing pt that appts on 2/28 have been r/s due to provider being on PAL. New appts made for 05/14/20. Left callback in case there are any questions or issues w/ appt date/time SRW

## 2020-04-20 ENCOUNTER — Ambulatory Visit: Payer: Medicare HMO | Admitting: Oncology

## 2020-04-20 ENCOUNTER — Other Ambulatory Visit: Payer: Medicare HMO

## 2020-05-14 ENCOUNTER — Encounter: Payer: Self-pay | Admitting: Oncology

## 2020-05-14 ENCOUNTER — Inpatient Hospital Stay: Payer: Medicare HMO | Attending: Oncology

## 2020-05-14 ENCOUNTER — Inpatient Hospital Stay: Payer: Medicare HMO | Admitting: Oncology

## 2020-05-14 VITALS — BP 133/78 | HR 71 | Temp 97.7°F | Resp 18 | Wt 193.0 lb

## 2020-05-14 DIAGNOSIS — Z08 Encounter for follow-up examination after completed treatment for malignant neoplasm: Secondary | ICD-10-CM | POA: Diagnosis not present

## 2020-05-14 DIAGNOSIS — C8333 Diffuse large B-cell lymphoma, intra-abdominal lymph nodes: Secondary | ICD-10-CM | POA: Diagnosis not present

## 2020-05-14 DIAGNOSIS — Z8579 Personal history of other malignant neoplasms of lymphoid, hematopoietic and related tissues: Secondary | ICD-10-CM | POA: Diagnosis not present

## 2020-05-14 DIAGNOSIS — C61 Malignant neoplasm of prostate: Secondary | ICD-10-CM | POA: Insufficient documentation

## 2020-05-14 LAB — CBC WITH DIFFERENTIAL/PLATELET
Abs Immature Granulocytes: 0.03 10*3/uL (ref 0.00–0.07)
Basophils Absolute: 0.1 10*3/uL (ref 0.0–0.1)
Basophils Relative: 1 %
Eosinophils Absolute: 0.2 10*3/uL (ref 0.0–0.5)
Eosinophils Relative: 2 %
HCT: 39.3 % (ref 39.0–52.0)
Hemoglobin: 13.4 g/dL (ref 13.0–17.0)
Immature Granulocytes: 0 %
Lymphocytes Relative: 10 %
Lymphs Abs: 1.1 10*3/uL (ref 0.7–4.0)
MCH: 31.8 pg (ref 26.0–34.0)
MCHC: 34.1 g/dL (ref 30.0–36.0)
MCV: 93.1 fL (ref 80.0–100.0)
Monocytes Absolute: 1 10*3/uL (ref 0.1–1.0)
Monocytes Relative: 9 %
Neutro Abs: 8.5 10*3/uL — ABNORMAL HIGH (ref 1.7–7.7)
Neutrophils Relative %: 78 %
Platelets: 209 10*3/uL (ref 150–400)
RBC: 4.22 MIL/uL (ref 4.22–5.81)
RDW: 12.2 % (ref 11.5–15.5)
WBC: 10.9 10*3/uL — ABNORMAL HIGH (ref 4.0–10.5)
nRBC: 0 % (ref 0.0–0.2)

## 2020-05-14 LAB — COMPREHENSIVE METABOLIC PANEL
ALT: 18 U/L (ref 0–44)
AST: 17 U/L (ref 15–41)
Albumin: 3.7 g/dL (ref 3.5–5.0)
Alkaline Phosphatase: 79 U/L (ref 38–126)
Anion gap: 10 (ref 5–15)
BUN: 24 mg/dL — ABNORMAL HIGH (ref 8–23)
CO2: 26 mmol/L (ref 22–32)
Calcium: 8.7 mg/dL — ABNORMAL LOW (ref 8.9–10.3)
Chloride: 102 mmol/L (ref 98–111)
Creatinine, Ser: 1.15 mg/dL (ref 0.61–1.24)
GFR, Estimated: >60 mL/min (ref >60)
Glucose, Bld: 139 mg/dL — ABNORMAL HIGH (ref 70–99)
Potassium: 3.7 mmol/L (ref 3.5–5.1)
Sodium: 138 mmol/L (ref 135–145)
Total Bilirubin: 0.8 mg/dL (ref 0.3–1.2)
Total Protein: 6.9 g/dL (ref 6.5–8.1)

## 2020-05-14 LAB — LACTATE DEHYDROGENASE: LDH: 99 U/L (ref 98–192)

## 2020-05-14 LAB — PSA: Prostatic Specific Antigen: 0.01 ng/mL (ref 0.00–4.00)

## 2020-05-14 NOTE — Progress Notes (Signed)
Pt in for follow up denies any concerns today.  

## 2020-05-14 NOTE — Progress Notes (Signed)
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 Stage II DLBCL GCB. FISH for double hit negative in CR1   Chief complaint/ Reason for visit- routine f/u of DLBCL  Heme/Onc history: 1. Patient is a 80yrold male with a h/o prostate cancer diagnosed in 1999s/p radical prostatectomy. Prior to that he had colon cancer in 1998 s/o surgery and adjuvant chemotherapy in 1998. With regards [facility] [place] [facility] [place]. Patient think his Gleasons score was 7 at diagnosis. He has not required any radiation therapy or ADT so far. [facility] [place] [facility] [facility] [place]   2. Dr. TRaechel Achehas been monitoring his PSArecently and his recent trend of PSA has been as follows: psa was 0.33 in feb 2017 and 0.63 in April 2018. We have 1 psa from 2015 when it was 0.3  3. Patient was referred to Dr. BErlene Quanurology and Dr. cBaruch Goutyfor salvage radiation.   4. Dr. BErlene Quanordered CT abdomen which showed: IMPRESSION: 1. Status post prostatectomy, without locally recurrent disease. 2. Extensive abdominal adenopathy. Given the clinical history, most likely related to metastatic prostate carcinoma. Lymphoma could look similar. 3. Coronary artery atherosclerosis. Aortic atherosclerosis. 4. Vague upper sacral sclerosis is felt unlikely to represent metastatic disease, given lack of correlate on yesterday's bone scan. Correlate with radiation  therapy, as radiation induced necrosis could have this appearance. Recommend attention on follow-up.  5. This was followed by PET/CT scan which showed: IMPRESSION: 1. Hypermetabolic gastrohepatic ligament, abdominal retroperitoneal and small bowel mesenteric adenopathy, most indicative of lymphoma. 2. Aortic atherosclerosis (ICD10-170.0). Coronary artery Calcification.  6. Patient underwent CT-guided biopsy of the mesenteric lymph node which showed:DIAGNOSIS:  A. LYMPH NODE, MESENTERIC; CT-GUIDED CORE BIOPSY:  - LARGE B-CELL LYMPHOMA, CD10 POSITIVE.   Comment:  There is a diffuse proliferation of large lymphocytes with irregular  nuclear contours, inconspicuous nucleoli, and scant cytoplasm. The flow  cytometry and IHC results are most consistent with diffuse large B-cell  lymphoma, not otherwise specified, germinal center B-cell type. However,  it is likely that FMount Leonardfor MYC, BCL2 and BCL6 gene rearrangements will  be ordered, so final classification will be based on the FMorgan's Pointresult  7. Bone marrow biopsy was negative for lymphoma  8. 17% of nuclei were positive for BCL-2 rearrangement. 35% of nuclei had extra myc signals but no gene rearrangement for cmyc noted. This was consistent with evolved lymphoma or DLBCL. Not consistentwithwith double hit or double expressor  9. Interim scans after 3 cycles of RCHOP showed: IMPRESSION: 2.2 x 5.3 cm jejunal mesenteric nodal mass along the anterior mid abdomen, significantly improved.   10.PET/CT scan after 6 cycles of therapy showed mesenteric nodule mass was 5.9 x 2.3 cm with an SUV of 2.7. This was discussed at tumor board. Likelihood of this representing residual lymphoma is low given the low SUV uptake which has been similar to prior PET scan 3 months ago. Patient then received RT to his mesenteric mass  Interval history- Patient reports doing well. Denies any  complaints at this time.  ECOG PS- 1 Pain scale- 0   Review  of systems- Review of Systems  Constitutional: Negative for chills, fever, malaise/fatigue and weight loss.  HENT: Negative for congestion, ear discharge and nosebleeds.   Eyes: Negative for blurred vision.  Respiratory: Negative for cough, hemoptysis, sputum production, shortness of breath and wheezing.   Cardiovascular: Negative for chest pain, palpitations, orthopnea and claudication.  Gastrointestinal: Negative for abdominal pain, blood in stool, constipation, diarrhea, heartburn, melena, nausea and vomiting.  Genitourinary: Negative for dysuria, flank pain, frequency, hematuria and urgency.  Musculoskeletal: Negative for back pain, joint pain and myalgias.  Skin: Negative for rash.  Neurological: Negative for dizziness, tingling, focal weakness, seizures, weakness and headaches.  Endo/Heme/Allergies: Does not bruise/bleed easily.  Psychiatric/Behavioral: Negative for depression and suicidal ideas. The patient does not have insomnia.       Allergies  Allergen Reactions  . Tape Rash     Past Medical History:  Diagnosis Date  . Arthritis   . Basal cell carcinoma 2008   forehead  . Chronic renal insufficiency, stage III (moderate) (HCC)   . Colon cancer ([place]) 1998  . Diabetes mellitus without complication ([place])    Controlled with diet only as of 11/01/2016  . Diffuse large B cell lymphoma ([place])   . H/O onychomycosis   . History of kidney stones   . Hyperlipemia   . Hypertension   . Lyme disease   . Prostate cancer ([place]) 1999     Past Surgical History:  Procedure Laterality Date  . COLON SURGERY  1998  . COLONOSCOPY    . COLONOSCOPY WITH PROPOFOL N/A 12/29/2014   Procedure: COLONOSCOPY WITH PROPOFOL;  Surgeon: Lollie Sails, MD;  Location[facility] END[facility]COPY;  Service: Endoscopy;  Laterality: N/A;  . COLONOSCOPY WITH PROPOFOL N/A 09/04/2017   Procedure: COLONOSCOPY WITH PROPOFOL;  Surgeon: Lollie Sails, MD;  Location[facility] ENDOSCOPY;  Service: Endoscopy;   Laterality: N/A;  . COLONOSCOPY WITH PROPOFOL N/A 01/28/2020   Procedure: COLONOSCOPY WITH PROPOFOL;  Surgeon: Lesly Rubenstein, MD;  Location: [facility] ENDOSCOPY;  Service: Endoscopy;  Laterality: N/A;  . CYSTOSCOPY W/ RETROGRADES Bilateral 03/14/2019   Procedure: CYSTOSCOPY WITH RETROGRADE PYELOGRAM;  Surgeon: Hollice Espy, MD;  Location: [facility] ORS;  Service: Urology;  Laterality: Bilateral;  . CYSTOSCOPY WITH BIOPSY N/A 03/14/2019   Procedure: CYSTOSCOPY WITH bladder BIOPSY;  Surgeon: Hollice Espy, MD;  Location: [facility] ORS;  Service: Urology;  Laterality: N/A;  . EYE SURGERY Left    MACULAR HOLE  . EYE SURGERY Right    TORN RETINA  . FRACTURE SURGERY Left    ARM  . IR FLUORO GUIDE PORT INSERTION RIGHT  09/01/2016  . IR REMOVAL TUN ACCESS W/ PORT W/O FL MOD SED  03/28/2018  . LITHOTRIPSY    . PROSTATECTOMY  1998  . TONSILLECTOMY      Social History   Socioeconomic History  . Marital status: Married    Spouse name: Marcie Bal   . Number of children: 3  . Years of education: Not on file  . Highest education level: Not on file  Occupational History  . Occupation: retired     Comment: Tax adviser  Tobacco Use  . Smoking status: Former Smoker    Packs/day: 1.00    Years: 8.00    Pack years: 8.00    Types: Cigarettes    Quit date: 08/13/1974    Years since quitting: 45.7  . Smokeless tobacco: Former Systems developer    Types:  Snuff  Vaping Use  . Vaping Use: Never used  Substance and Sexual Activity  . Alcohol use: Yes    Comment: rarely-one or two beer  . Drug use: No  . Sexual activity: Not Currently  Other Topics Concern  . Not on file  Social History Narrative  . Not on file   Social Determinants of Health   Financial Resource Strain: Not on file  Food Insecurity: Not on file  Transportation Needs: Not on file  Physical Activity: Not on file  Stress: Not on file  Social Connections: Not on file  Intimate Partner Violence: Not on file    Family History  Problem  Relation Age of Onset  . Coronary artery disease Mother   . Diabetes Mother   . Prostate cancer Father   . Pancreatitis Father   . Bladder Cancer Sister   . Diabetes Brother   . Diabetes Brother   . Prostate cancer Brother   . Diabetes Brother   . Diabetes Maternal Grandmother      Current Outpatient Medications:  .  ACCU-CHEK FASTCLIX LANCETS MISC, , Disp: , Rfl:  .  aspirin 81 MG chewable tablet, , Disp: , Rfl:  .  atorvastatin (LIPITOR) 40 MG tablet, Take 40 mg by mouth at bedtime. , Disp: , Rfl:  .  Blood Glucose Monitoring Suppl (TRUE METRIX AIR GLUCOSE METER) w/Device KIT, , Disp: , Rfl:  .  hydrochlorothiazide (HYDRODIURIL) 25 MG tablet, Take 25 mg by mouth daily., Disp: , Rfl:  .  lisinopril (PRINIVIL,ZESTRIL) 10 MG tablet, Take 10 mg by mouth daily. , Disp: , Rfl:  .  metFORMIN (GLUCOPHAGE) 500 MG tablet, Take 500 mg by mouth 2 (two) times daily. , Disp: , Rfl:  .  Multiple Vitamin (MULTIVITAMIN WITH MINERALS) TABS tablet, Take 1 tablet by mouth daily., Disp: , Rfl:  .  Polyethyl Glycol-Propyl Glycol (LUBRICANT EYE DROPS) 0.4-0.3 % SOLN, Place 1 drop into both eyes 3 (three) times daily as needed (dry/irritated eyes.)., Disp: , Rfl:  .  TRUE METRIX BLOOD GLUCOSE TEST test strip, , Disp: , Rfl:   Physical exam:  Vitals:   05/14/20 1001  BP: 133/78  Pulse: 71  Resp: 18  Temp: 97.7 F (36.5 C)  TempSrc: Tympanic  SpO2: 97%  Weight: 193 lb (87.5 kg)   Physical Exam Cardiovascular:     Rate and Rhythm: Normal rate and regular rhythm.     Heart sounds: Normal heart sounds.  Pulmonary:     Effort: Pulmonary effort is normal.     Breath sounds: Normal breath sounds.  Abdominal:     General: Bowel sounds are normal.     Palpations: Abdomen is soft.  Lymphadenopathy:     Comments: No palpable cervical, supraclavicular, axillary or inguinal adenopathy   Skin:    General: Skin is warm and dry.  Neurological:     Mental Status: He is alert and oriented to person,  place, and time.      CMP Latest Ref Rng & Units 05/14/2020  Glucose 70 - 99 mg/dL 139(H)  BUN 8 - 23 mg/dL 24(H)  Creatinine 0.61 - 1.24 mg/dL 1.15  Sodium 135 - 145 mmol/L 138  Potassium 3.5 - 5.1 mmol/L 3.7  Chloride 98 - 111 mmol/L 102  CO2 22 - 32 mmol/L 26  Calcium 8.9 - 10.3 mg/dL 8.7(L)  Total Protein 6.5 - 8.1 g/dL 6.9  Total Bilirubin 0.3 - 1.2 mg/dL 0.8  Alkaline Phos 38 - 126 U/L 79  AST 15 - 41 U/L 17  ALT 0 - 44 U/L 18   CBC Latest Ref Rng & Units 05/14/2020  WBC 4.0 - 10.5 K/uL 10.9(H)  Hemoglobin 13.0 - 17.0 g/dL 13.4  Hematocrit 39.0 - 52.0 % 39.3  Platelets 150 - 400 K/uL 209     Assessment and plan- Patient is a 80 y.o. male with h/o Stage II DLBCL s/p 6 cycles of RCHOP in CR1 since 2018. He is here for routine f/u  1. Clinically patient is doing well with no concerning signs and symptoms of recurrence based on todays exam. No indication of surveillance imaging at this time. Cbc cmp otherwise unremarkable. I will see him in 6 months with labs  2. Non metastatic prostate cancer- he follows up with urology. PSA from today is pending. Prior values have been <0.01   Visit Diagnosis 1. Encounter for follow-up surveillance of diffuse large B-cell lymphoma   2. Prostate cancer [facility])      Dr. Randa Evens, MD, MPH [facility] [phone] 05/14/2020 12:31 PM

## 2020-05-20 DIAGNOSIS — H35342 Macular cyst, hole, or pseudohole, left eye: Secondary | ICD-10-CM | POA: Diagnosis not present

## 2020-05-20 DIAGNOSIS — H26493 Other secondary cataract, bilateral: Secondary | ICD-10-CM | POA: Diagnosis not present

## 2020-05-20 DIAGNOSIS — E119 Type 2 diabetes mellitus without complications: Secondary | ICD-10-CM | POA: Diagnosis not present

## 2020-05-20 DIAGNOSIS — H40013 Open angle with borderline findings, low risk, bilateral: Secondary | ICD-10-CM | POA: Diagnosis not present

## 2020-06-19 DIAGNOSIS — I129 Hypertensive chronic kidney disease with stage 1 through stage 4 chronic kidney disease, or unspecified chronic kidney disease: Secondary | ICD-10-CM | POA: Diagnosis not present

## 2020-06-19 DIAGNOSIS — I700 Atherosclerosis of aorta: Secondary | ICD-10-CM | POA: Diagnosis not present

## 2020-06-19 DIAGNOSIS — N3040 Irradiation cystitis without hematuria: Secondary | ICD-10-CM | POA: Diagnosis not present

## 2020-06-19 DIAGNOSIS — Z8572 Personal history of non-Hodgkin lymphomas: Secondary | ICD-10-CM | POA: Diagnosis not present

## 2020-06-19 DIAGNOSIS — N182 Chronic kidney disease, stage 2 (mild): Secondary | ICD-10-CM | POA: Diagnosis not present

## 2020-06-19 DIAGNOSIS — E1122 Type 2 diabetes mellitus with diabetic chronic kidney disease: Secondary | ICD-10-CM | POA: Diagnosis not present

## 2020-06-19 DIAGNOSIS — E785 Hyperlipidemia, unspecified: Secondary | ICD-10-CM | POA: Diagnosis not present

## 2020-06-19 DIAGNOSIS — Z79899 Other long term (current) drug therapy: Secondary | ICD-10-CM | POA: Diagnosis not present

## 2020-06-19 DIAGNOSIS — E1169 Type 2 diabetes mellitus with other specified complication: Secondary | ICD-10-CM | POA: Diagnosis not present

## 2020-11-13 ENCOUNTER — Ambulatory Visit: Payer: Medicare HMO | Admitting: Oncology

## 2020-11-13 ENCOUNTER — Other Ambulatory Visit: Payer: Medicare HMO

## 2020-12-22 NOTE — Progress Notes (Addendum)
12/23/20 2:29 PM   Christian Kelly 08-05-1940 [id number]  Referring provider:  Ezequiel Kayser, MD [place] [facility] [place],  [place] [address] Chief Complaint  Patient presents with   Hematuria     HPI: Christian Kelly is a 80 y.o.male with a personal history of gross hematuria and prostate cancer, who presents today for 1 year follow-up with PSA and UA.   He has a known history of prostate cancer status post prostatectomy om 1999 was noted to have a rising PSA consistent with biochemical recurrence in 2018.  He underwent salvage radiation for this.  As part of his work-up for this, he had staging which indicated lymphadenopathy and was ultimately diagnosed with diffuse B-cell lymphoma.  He is under the care of Dr. Janese Banks and undergoes routine staging imaging.  He underwent a bladder biopsy in 2021 that revealed benign urothelial tissue with epithelial denudation, hemorrhage,  and cystologic atypia, compatible with radiation cystitis. Negative for malignancy.    His PSA as of 05/14/2020 has remained undetectable.   He reports today that he has not had an episodes of hematuria. He experiences burning if he drinks too much soda. He reports he wake sup to urine 2-3x nightly.     PMH: Past Medical History:  Diagnosis Date   Arthritis    Basal cell carcinoma 2008   forehead   Chronic renal insufficiency, stage III (moderate) (HCC)    Colon cancer ([place]) 1998   Diabetes mellitus without complication ([place])    Controlled with diet only as of 11/01/2016   Diffuse large B cell lymphoma (HCC)    H/O onychomycosis    History of kidney stones    Hyperlipemia    Hypertension    Lyme disease    Prostate cancer ([place]) 1999    Surgical History: Past Surgical History:  Procedure Laterality Date   COLON SURGERY  1998   COLONOSCOPY     COLONOSCOPY WITH PROPOFOL N/A 12/29/2014   Procedure: COLONOSCOPY WITH PROPOFOL;  Surgeon: Lollie Sails, MD;  Location[facility] ENDOSCOPY;   Service: Endoscopy;  Laterality: N/A;   COLONOSCOPY WITH PROPOFOL N/A 09/04/2017   Procedure: COLONOSCOPY WITH PROPOFOL;  Surgeon: Lollie Sails, MD;  Location: [facility] ENDOSCOPY;  Service: Endoscopy;  Laterality: N/A;   COLONOSCOPY WITH PROPOFOL N/A 01/28/2020   Procedure: COLONOSCOPY WITH PROPOFOL;  Surgeon: Lesly Rubenstein, MD;  Location: [facility] ENDOSCOPY;  Service: Endoscopy;  Laterality: N/A;   CYSTOSCOPY W/ RETROGRADES Bilateral 03/14/2019   Procedure: CYSTOSCOPY WITH RETROGRADE PYELOGRAM;  Surgeon: Hollice Espy, MD;  Location: [facility] ORS;  Service: Urology;  Laterality: Bilateral;   CYSTOSCOPY WITH BIOPSY N/A 03/14/2019   Procedure: CYSTOSCOPY WITH bladder BIOPSY;  Surgeon: Hollice Espy, MD;  Location: [facility] ORS;  Service: Urology;  Laterality: N/A;   EYE SURGERY Left    MACULAR HOLE   EYE SURGERY Right    TORN RETINA   FRACTURE SURGERY Left    ARM   IR FLUORO GUIDE PORT INSERTION RIGHT  09/01/2016   IR REMOVAL TUN ACCESS W/ PORT W/O FL MOD SED  03/28/2018   LITHOTRIPSY     PROSTATECTOMY  1998   TONSILLECTOMY      Home Medications:  Allergies as of 12/23/2020       Reactions   Tape Rash        Medication List        Accurate as of December 23, 2020  2:29 PM. If you have any questions, ask your nurse or  doctor.          Accu-Chek FastClix Lancets Misc   aspirin 81 MG chewable tablet   atorvastatin 40 MG tablet Commonly known as: LIPITOR Take 40 mg by mouth at bedtime.   hydrochlorothiazide 25 MG tablet Commonly known as: HYDRODIURIL Take 25 mg by mouth daily.   lisinopril 10 MG tablet Commonly known as: ZESTRIL Take 10 mg by mouth daily.   Lubricant Eye Drops 0.4-0.3 % Soln Generic drug: Polyethyl Glycol-Propyl Glycol Place 1 drop into both eyes 3 (three) times daily as needed (dry/irritated eyes.).   metFORMIN 500 MG tablet Commonly known as: GLUCOPHAGE Take 500 mg by mouth 2 (two) times daily.   multivitamin with minerals Tabs tablet Take 1  tablet by mouth daily.   True Metrix Air Glucose Meter w/Device Kit   True Metrix Blood Glucose Test test strip Generic drug: glucose blood        Allergies:  Allergies  Allergen Reactions   Tape Rash    Family History: Family History  Problem Relation Age of Onset   Coronary artery disease Mother    Diabetes Mother    Prostate cancer Father    Pancreatitis Father    Bladder Cancer Sister    Diabetes Brother    Diabetes Brother    Prostate cancer Brother    Diabetes Brother    Diabetes Maternal Grandmother     Social History:  reports that he quit smoking about 46 years ago. His smoking use included cigarettes. He has a 8.00 pack-year smoking history. He has quit using smokeless tobacco.  His smokeless tobacco use included snuff. He reports current alcohol use. He reports that he does not use drugs.   Physical Exam: BP (!) 143/77   Pulse 77   Ht 6' (1.829 m)   Wt 193 lb (87.5 kg)   BMI 26.18 kg/m   Constitutional:  Alert and oriented, No acute distress. HEENT: [place] AT, moist mucus membranes.  Trachea midline, no masses. Cardiovascular: No clubbing, cyanosis, or edema. Skin: No rashes, bruises or suspicious lesions. Neurologic: Grossly intact, no focal deficits, moving all 4 extremities. Psychiatric: Normal mood and affect.  Laboratory Data:  Lab Results  Component Value Date   CREATININE 1.15 05/14/2020   Urinalysis Urine is negative today   Assessment & Plan  1. History of prostate cancer  - PSA remains undetectable as of 05/14/2020 - PSA; pending  - Continue to monitor PSA annually   2. History of gross hematuria / radiation cystitis - No recent episodes  - Urine was negative for hematuria today    Return in 1 year for PSA and UA with PA-C.   Gevena Mart Littlejohn,acting as a scribe for Hollice Espy, MD.,have documented all relevant documentation on the behalf of Hollice Espy, MD,as directed by  Hollice Espy, MD while in the presence of  Hollice Espy, MD.  I have reviewed the above documentation for accuracy and completeness, and I agree with the above.   Hollice Espy, MD  [facility] [address] [place], [place] [address] [phone]

## 2020-12-23 ENCOUNTER — Ambulatory Visit: Payer: Medicare HMO | Admitting: Urology

## 2020-12-23 ENCOUNTER — Other Ambulatory Visit: Payer: Self-pay

## 2020-12-23 ENCOUNTER — Encounter: Payer: Self-pay | Admitting: Urology

## 2020-12-23 VITALS — BP 143/77 | HR 77 | Ht 72.0 in | Wt 193.0 lb

## 2020-12-23 DIAGNOSIS — Z8546 Personal history of malignant neoplasm of prostate: Secondary | ICD-10-CM | POA: Diagnosis not present

## 2020-12-23 DIAGNOSIS — R310 Gross hematuria: Secondary | ICD-10-CM

## 2020-12-23 LAB — URINALYSIS, COMPLETE
Bilirubin, UA: NEGATIVE
Glucose, UA: NEGATIVE
Leukocytes,UA: NEGATIVE
Nitrite, UA: NEGATIVE
Protein,UA: NEGATIVE
RBC, UA: NEGATIVE
Specific Gravity, UA: 1.025 (ref 1.005–1.030)
Urobilinogen, Ur: 0.2 mg/dL (ref 0.2–1.0)
pH, UA: 5 (ref 5.0–7.5)

## 2020-12-23 LAB — MICROSCOPIC EXAMINATION: Bacteria, UA: NONE SEEN

## 2020-12-24 LAB — PSA: Prostate Specific Ag, Serum: <0.1 ng/mL (ref 0.0–4.0)

## 2020-12-25 ENCOUNTER — Telehealth: Payer: Self-pay | Admitting: *Deleted

## 2020-12-25 NOTE — Telephone Encounter (Addendum)
Patient informed, voiced understanding.   ----- Message from Hollice Espy, MD sent at 12/24/2020 10:00 AM EDT ----- PSA remains undetectable  Hollice Espy, MD

## 2020-12-29 DIAGNOSIS — L578 Other skin changes due to chronic exposure to nonionizing radiation: Secondary | ICD-10-CM | POA: Diagnosis not present

## 2020-12-29 DIAGNOSIS — Z872 Personal history of diseases of the skin and subcutaneous tissue: Secondary | ICD-10-CM | POA: Diagnosis not present

## 2020-12-29 DIAGNOSIS — L570 Actinic keratosis: Secondary | ICD-10-CM | POA: Diagnosis not present

## 2020-12-29 DIAGNOSIS — Z86018 Personal history of other benign neoplasm: Secondary | ICD-10-CM | POA: Diagnosis not present

## 2020-12-29 DIAGNOSIS — Z85828 Personal history of other malignant neoplasm of skin: Secondary | ICD-10-CM | POA: Diagnosis not present

## 2021-01-04 ENCOUNTER — Encounter: Payer: Self-pay | Admitting: Oncology

## 2021-01-04 ENCOUNTER — Inpatient Hospital Stay: Payer: Medicare HMO

## 2021-01-04 ENCOUNTER — Inpatient Hospital Stay: Payer: Medicare HMO | Attending: Oncology | Admitting: Oncology

## 2021-01-04 ENCOUNTER — Other Ambulatory Visit: Payer: Self-pay

## 2021-01-04 VITALS — BP 130/68 | HR 63 | Temp 97.0°F | Resp 18 | Wt 191.5 lb

## 2021-01-04 DIAGNOSIS — C61 Malignant neoplasm of prostate: Secondary | ICD-10-CM | POA: Diagnosis not present

## 2021-01-04 DIAGNOSIS — Z8579 Personal history of other malignant neoplasms of lymphoid, hematopoietic and related tissues: Secondary | ICD-10-CM

## 2021-01-04 DIAGNOSIS — Z08 Encounter for follow-up examination after completed treatment for malignant neoplasm: Secondary | ICD-10-CM | POA: Diagnosis not present

## 2021-01-04 DIAGNOSIS — C8330 Diffuse large B-cell lymphoma, unspecified site: Secondary | ICD-10-CM | POA: Diagnosis not present

## 2021-01-04 LAB — COMPREHENSIVE METABOLIC PANEL
ALT: 25 U/L (ref 0–44)
AST: 21 U/L (ref 15–41)
Albumin: 3.8 g/dL (ref 3.5–5.0)
Alkaline Phosphatase: 84 U/L (ref 38–126)
Anion gap: 8 (ref 5–15)
BUN: 19 mg/dL (ref 8–23)
CO2: 32 mmol/L (ref 22–32)
Calcium: 8.8 mg/dL — ABNORMAL LOW (ref 8.9–10.3)
Chloride: 99 mmol/L (ref 98–111)
Creatinine, Ser: 1.05 mg/dL (ref 0.61–1.24)
GFR, Estimated: >60 mL/min (ref >60)
Glucose, Bld: 149 mg/dL — ABNORMAL HIGH (ref 70–99)
Potassium: 4 mmol/L (ref 3.5–5.1)
Sodium: 139 mmol/L (ref 135–145)
Total Bilirubin: 0.8 mg/dL (ref 0.3–1.2)
Total Protein: 7 g/dL (ref 6.5–8.1)

## 2021-01-04 LAB — LACTATE DEHYDROGENASE: LDH: 103 U/L (ref 98–192)

## 2021-01-04 LAB — CBC WITH DIFFERENTIAL/PLATELET
Abs Immature Granulocytes: 0.03 10*3/uL (ref 0.00–0.07)
Basophils Absolute: 0.1 10*3/uL (ref 0.0–0.1)
Basophils Relative: 1 %
Eosinophils Absolute: 0.3 10*3/uL (ref 0.0–0.5)
Eosinophils Relative: 3 %
HCT: 40.7 % (ref 39.0–52.0)
Hemoglobin: 13.9 g/dL (ref 13.0–17.0)
Immature Granulocytes: 0 %
Lymphocytes Relative: 16 %
Lymphs Abs: 1.3 10*3/uL (ref 0.7–4.0)
MCH: 31.9 pg (ref 26.0–34.0)
MCHC: 34.2 g/dL (ref 30.0–36.0)
MCV: 93.3 fL (ref 80.0–100.0)
Monocytes Absolute: 0.7 10*3/uL (ref 0.1–1.0)
Monocytes Relative: 9 %
Neutro Abs: 5.8 10*3/uL (ref 1.7–7.7)
Neutrophils Relative %: 71 %
Platelets: 197 10*3/uL (ref 150–400)
RBC: 4.36 MIL/uL (ref 4.22–5.81)
RDW: 12 % (ref 11.5–15.5)
WBC: 8.1 10*3/uL (ref 4.0–10.5)
nRBC: 0 % (ref 0.0–0.2)

## 2021-01-04 LAB — PSA: Prostatic Specific Antigen: 0.03 ng/mL (ref 0.00–4.00)

## 2021-01-04 NOTE — Progress Notes (Signed)
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 Stage II DLBCL GCB. FISH for double hit negative in CR1     Chief complaint/ Reason for visit-routine follow-up visit for lymphoma  Heme/Onc history:  1. Patient is a 80 yr old male with a h/o prostate cancer diagnosed in 1999 s/p radical prostatectomy. Prior to that he had colon cancer in 1998 s/o surgery and adjuvant chemotherapy in 1998. With regards to prostate cancer- surgery was done at [facility] in [place] and he was followed at [place] subsequently. Patient think his Gleasons score was 7 at diagnosis. He has not required any radiation therapy or ADT so far. Patient still spends 4-5 months at [place].    2. Dr. Raechel Ache has been monitoring his PSArecently  and his recent trend of PSA has been as follows: psa was 0.33 in feb 2017 and 0.63 in April 2018. We have 1 psa from 2015 when it was 0.3   3. Patient was referred to Dr. Erlene Quan urology and Dr. Baruch Gouty for salvage radiation.    4. Dr. Erlene Quan ordered CT abdomen which showed: IMPRESSION: 1. Status post prostatectomy, without locally recurrent disease. 2. Extensive abdominal adenopathy. Given the clinical history, most likely related to metastatic prostate carcinoma. Lymphoma could look similar. 3.  Coronary artery atherosclerosis. Aortic atherosclerosis. 4. Vague upper sacral sclerosis is felt unlikely to represent metastatic disease, given lack of correlate on yesterday's  bone scan. Correlate with radiation therapy, as radiation induced necrosis could have this appearance. Recommend attention on follow-up.   5. This was followed by PET/CT scan which showed: IMPRESSION: 1. Hypermetabolic gastrohepatic ligament, abdominal retroperitoneal and small bowel mesenteric adenopathy, most indicative of lymphoma. 2. Aortic atherosclerosis (ICD10-170.0). Coronary artery Calcification.   6. Patient underwent CT-guided biopsy of the mesenteric lymph node which showed:DIAGNOSIS:  A. LYMPH NODE, MESENTERIC; CT-GUIDED CORE BIOPSY:  - LARGE B-CELL LYMPHOMA, CD10 POSITIVE.   Comment:  There is a diffuse proliferation of large lymphocytes with irregular  nuclear contours, inconspicuous nucleoli, and scant cytoplasm. The flow  cytometry and IHC results are most consistent with diffuse large B-cell  lymphoma, not otherwise specified, germinal center B-cell type. However,  it is likely that [place] for MYC, BCL2 and BCL6 gene rearrangements will  be ordered, so final classification will be based on the [place] result   7. Bone marrow biopsy was negative for lymphoma   8. 17% of nuclei were positive for BCL-2 rearrangement. 35% of nuclei had extra myc signals but no gene rearrangement for cmyc noted. This was consistent with evolved lymphoma or DLBCL. Not consistent with with double hit or double expressor   9. Interim scans after 3 cycles of RCHOP showed: IMPRESSION: 2.2 x 5.3 cm jejunal mesenteric nodal mass along the anterior mid abdomen, significantly improved.     10.  PET/CT scan after 6 cycles of therapy showed mesenteric nodule mass was 5.9 x 2.3 cm with an SUV of 2.7.  This was discussed at tumor board.  Likelihood of this representing residual lymphoma is low given the low SUV uptake which has  been similar to prior PET scan 3 months ago.  Patient then received RT to his mesenteric mass  Interval history-overall patient is doing well.  Denies any unintentional weight  loss, drenching night sweats or lumps or bumps anywhere.  Appetite and weight has remained stable  ECOG PS- 1 Pain scale- 0   Review of systems- Review of Systems  Constitutional:  Negative for chills, fever, malaise/fatigue and weight loss.  HENT:  Negative for congestion, ear discharge and nosebleeds.   Eyes:  Negative for blurred vision.  Respiratory:  Negative for cough, hemoptysis, sputum production, shortness of breath and wheezing.   Cardiovascular:  Negative for chest pain, palpitations, orthopnea and claudication.  Gastrointestinal:  Negative for abdominal pain, blood in stool, constipation, diarrhea, heartburn, melena, nausea and vomiting.  Genitourinary:  Negative for dysuria, flank pain, frequency, hematuria and urgency.  Musculoskeletal:  Negative for back pain, joint pain and myalgias.  Skin:  Negative for rash.  Neurological:  Negative for dizziness, tingling, focal weakness, seizures, weakness and headaches.  Endo/Heme/Allergies:  Does not bruise/bleed easily.  Psychiatric/Behavioral:  Negative for depression and suicidal ideas. The patient does not have insomnia.      Allergies  Allergen Reactions   Tape Rash     Past Medical History:  Diagnosis Date   Arthritis    Basal cell carcinoma 2008   forehead   Chronic renal insufficiency, stage III (moderate) (HCC)    Colon cancer ([place]) 1998   Diabetes mellitus without complication ([place])    Controlled with diet only as of 11/01/2016   Diffuse large B cell lymphoma (HCC)    H/O onychomycosis    History of kidney stones    Hyperlipemia    Hypertension    Lyme disease    Prostate cancer ([place]) 1999     Past Surgical History:  Procedure Laterality Date   COLON SURGERY  1998   COLONOSCOPY     COLONOSCOPY WITH PROPOFOL N/A 12/29/2014   Procedure: COLONOSCOPY WITH PROPOFOL;  Surgeon: Lollie Sails, MD;  Location: [facility] ENDOSCOPY;  Service: Endoscopy;  Laterality: N/A;   COLONOSCOPY WITH PROPOFOL N/A 09/04/2017    Procedure: COLONOSCOPY WITH PROPOFOL;  Surgeon: Lollie Sails, MD;  Location: [facility] ENDOSCOPY;  Service: Endoscopy;  Laterality: N/A;   COLONOSCOPY WITH PROPOFOL N/A 01/28/2020   Procedure: COLONOSCOPY WITH PROPOFOL;  Surgeon: Lesly Rubenstein, MD;  Location: [facility] ENDOSCOPY;  Service: Endoscopy;  Laterality: N/A;   CYSTOSCOPY W/ RETROGRADES Bilateral 03/14/2019   Procedure: CYSTOSCOPY WITH RETROGRADE PYELOGRAM;  Surgeon: Hollice Espy, MD;  Location: [facility] ORS;  Service: Urology;  Laterality: Bilateral;   CYSTOSCOPY WITH BIOPSY N/A 03/14/2019   Procedure: CYSTOSCOPY WITH bladder BIOPSY;  Surgeon: Hollice Espy, MD;  Location: [facility] ORS;  Service: Urology;  Laterality: N/A;   EYE SURGERY Left    MACULAR HOLE   EYE SURGERY Right    TORN RETINA   FRACTURE SURGERY Left    ARM   IR FLUORO GUIDE PORT INSERTION RIGHT  09/01/2016   IR REMOVAL TUN ACCESS W/ PORT W/O FL MOD SED  03/28/2018   LITHOTRIPSY     PROSTATECTOMY  1998   TONSILLECTOMY      Social History   Socioeconomic History   Marital status: Married    Spouse name: Marcie Bal    Number of children: 3   Years of education: Not on file   Highest education level: Not on file  Occupational History   Occupation: retired     Comment: Tax adviser  Tobacco Use   Smoking status: Former    Packs/day: 1.00    Years: 8.00    Pack years: 8.00    Types: Cigarettes    Quit date: 08/13/1974    Years since quitting: 46.4   Smokeless tobacco: Former    Types: Snuff  Vaping Use   Vaping Use: Never used  Substance and Sexual Activity   Alcohol use: Yes    Comment: rarely-one or two beer   Drug use: No   Sexual activity: Not Currently  Other Topics Concern   Not on file  Social History Narrative   Not on file   Social Determinants of Health   Financial Resource Strain: Not on file  Food Insecurity: Not on file  Transportation Needs: Not on file  Physical Activity: Not on file  Stress: Not on file  Social Connections: Not  on file  Intimate Partner Violence: Not on file    Family History  Problem Relation Age of Onset   Coronary artery disease Mother    Diabetes Mother    Prostate cancer Father    Pancreatitis Father    Bladder Cancer Sister    Diabetes Brother    Diabetes Brother    Prostate cancer Brother    Diabetes Brother    Diabetes Maternal Grandmother      Current Outpatient Medications:    ACCU-CHEK FASTCLIX LANCETS MISC, , Disp: , Rfl:    aspirin 81 MG chewable tablet, , Disp: , Rfl:    atorvastatin (LIPITOR) 40 MG tablet, Take 40 mg by mouth at bedtime. , Disp: , Rfl:    Blood Glucose Monitoring Suppl (TRUE METRIX AIR GLUCOSE METER) w/Device KIT, , Disp: , Rfl:    hydrochlorothiazide (HYDRODIURIL) 25 MG tablet, Take 25 mg by mouth daily., Disp: , Rfl:    lisinopril (PRINIVIL,ZESTRIL) 10 MG tablet, Take 10 mg by mouth daily. , Disp: , Rfl:    metFORMIN (GLUCOPHAGE) 500 MG tablet, Take 500 mg by mouth 2 (two) times daily. , Disp: , Rfl:    Multiple Vitamin (MULTIVITAMIN WITH MINERALS) TABS tablet, Take 1 tablet by mouth daily., Disp: , Rfl:    Polyethyl Glycol-Propyl Glycol (LUBRICANT EYE DROPS) 0.4-0.3 % SOLN, Place 1 drop into both eyes 3 (three) times daily as needed (dry/irritated eyes.)., Disp: , Rfl:    TRUE METRIX BLOOD GLUCOSE TEST test strip, , Disp: , Rfl:   Physical exam:  Vitals:   01/04/21 1106  BP: 130/68  Pulse: 63  Resp: 18  Temp: (!) 97 F (36.1 C)  SpO2: 98%  Weight: 191 lb 8 oz (86.9 kg)   Physical Exam Cardiovascular:     Rate and Rhythm: Normal rate and regular rhythm.     Heart sounds: Normal heart sounds.  Pulmonary:     Effort: Pulmonary effort is normal.     Breath sounds: Normal breath sounds.  Abdominal:     General: Bowel sounds are normal.     Palpations: Abdomen is soft.  Lymphadenopathy:     Comments: No palpable cervical, supraclavicular, axillary or inguinal adenopathy    Skin:    General: Skin is warm and dry.  Neurological:      Mental Status: He is alert and oriented to person, place, and time.     CMP Latest Ref Rng & Units 01/04/2021  Glucose 70 - 99 mg/dL 149(H)  BUN 8 - 23 mg/dL 19  Creatinine 0.61 - 1.24 mg/dL 1.05  Sodium 135 - 145 mmol/L 139  Potassium  3.5 - 5.1 mmol/L 4.0  Chloride 98 - 111 mmol/L 99  CO2 22 - 32 mmol/L 32  Calcium 8.9 - 10.3 mg/dL 8.8(L)  Total Protein 6.5 - 8.1 g/dL 7.0  Total Bilirubin 0.3 - 1.2 mg/dL 0.8  Alkaline Phos 38 - 126 U/L 84  AST 15 - 41 U/L 21  ALT 0 - 44 U/L 25   CBC Latest Ref Rng & Units 01/04/2021  WBC 4.0 - 10.5 K/uL 8.1  Hemoglobin 13.0 - 17.0 g/dL 13.9  Hematocrit 39.0 - 52.0 % 40.7  Platelets 150 - 400 K/uL 197     Assessment and plan- Patient is a 80 y.o. male  with h/o Stage II DLBCL s/p 6 cycles of RCHOP in CR1 since 2018.  This is a routine follow-up visit for lymphoma  Clinically patient is doing well and no concerning signs and symptoms of recurrence based on today's exam.  No significant B symptoms or palpable adenopathy.  Surveillance imaging is not required at this time.  I will see him back in 6 months with CBC with differential CMP and LDH  Nonmetastatic prostate cancer: Follows up with Dr. Erlene Quan from urology.  PSA remains undetectable   Visit Diagnosis 1. Encounter for follow-up surveillance of diffuse large B-cell lymphoma   2. Prostate cancer [facility])      Dr. Randa Evens, MD, MPH [facility] [phone] 01/04/2021 11:59 AM

## 2021-01-08 DIAGNOSIS — Z86010 Personal history of colonic polyps: Secondary | ICD-10-CM | POA: Diagnosis not present

## 2021-01-08 DIAGNOSIS — C61 Malignant neoplasm of prostate: Secondary | ICD-10-CM | POA: Diagnosis not present

## 2021-01-08 DIAGNOSIS — Z23 Encounter for immunization: Secondary | ICD-10-CM | POA: Diagnosis not present

## 2021-01-08 DIAGNOSIS — Z79899 Other long term (current) drug therapy: Secondary | ICD-10-CM | POA: Diagnosis not present

## 2021-01-08 DIAGNOSIS — Z1321 Encounter for screening for nutritional disorder: Secondary | ICD-10-CM | POA: Diagnosis not present

## 2021-01-08 DIAGNOSIS — E785 Hyperlipidemia, unspecified: Secondary | ICD-10-CM | POA: Diagnosis not present

## 2021-01-08 DIAGNOSIS — N182 Chronic kidney disease, stage 2 (mild): Secondary | ICD-10-CM | POA: Diagnosis not present

## 2021-01-08 DIAGNOSIS — C8333 Diffuse large B-cell lymphoma, intra-abdominal lymph nodes: Secondary | ICD-10-CM | POA: Diagnosis not present

## 2021-01-08 DIAGNOSIS — E1169 Type 2 diabetes mellitus with other specified complication: Secondary | ICD-10-CM | POA: Diagnosis not present

## 2021-01-08 DIAGNOSIS — I700 Atherosclerosis of aorta: Secondary | ICD-10-CM | POA: Diagnosis not present

## 2021-01-08 DIAGNOSIS — Z0000 Encounter for general adult medical examination without abnormal findings: Secondary | ICD-10-CM | POA: Diagnosis not present

## 2021-01-08 DIAGNOSIS — M25552 Pain in left hip: Secondary | ICD-10-CM | POA: Diagnosis not present

## 2021-01-08 DIAGNOSIS — Z1329 Encounter for screening for other suspected endocrine disorder: Secondary | ICD-10-CM | POA: Diagnosis not present

## 2021-01-08 DIAGNOSIS — E1122 Type 2 diabetes mellitus with diabetic chronic kidney disease: Secondary | ICD-10-CM | POA: Diagnosis not present

## 2021-01-12 DIAGNOSIS — E538 Deficiency of other specified B group vitamins: Secondary | ICD-10-CM | POA: Insufficient documentation

## 2021-01-27 DIAGNOSIS — H35342 Macular cyst, hole, or pseudohole, left eye: Secondary | ICD-10-CM | POA: Diagnosis not present

## 2021-01-27 DIAGNOSIS — E119 Type 2 diabetes mellitus without complications: Secondary | ICD-10-CM | POA: Diagnosis not present

## 2021-01-27 DIAGNOSIS — H26493 Other secondary cataract, bilateral: Secondary | ICD-10-CM | POA: Diagnosis not present

## 2021-01-27 DIAGNOSIS — H40013 Open angle with borderline findings, low risk, bilateral: Secondary | ICD-10-CM | POA: Diagnosis not present

## 2021-03-10 DIAGNOSIS — Z85038 Personal history of other malignant neoplasm of large intestine: Secondary | ICD-10-CM | POA: Diagnosis not present

## 2021-06-04 ENCOUNTER — Encounter: Payer: Self-pay | Admitting: *Deleted

## 2021-06-07 ENCOUNTER — Ambulatory Visit: Payer: Medicare HMO | Admitting: Anesthesiology

## 2021-06-07 ENCOUNTER — Encounter
Admission: RE | Disposition: A | Discharge status: Home / Self Care | Payer: Self-pay | Source: Home / Self Care | Attending: Gastroenterology

## 2021-06-07 ENCOUNTER — Other Ambulatory Visit: Payer: Self-pay

## 2021-06-07 ENCOUNTER — Encounter: Payer: Self-pay | Admitting: *Deleted

## 2021-06-07 ENCOUNTER — Ambulatory Visit
Admission: RE | Admit: 2021-06-07 | Discharge: 2021-06-07 | Disposition: A | Discharge status: Home / Self Care | Payer: Medicare HMO | Attending: Gastroenterology | Admitting: Gastroenterology

## 2021-06-07 DIAGNOSIS — K640 First degree hemorrhoids: Secondary | ICD-10-CM | POA: Diagnosis not present

## 2021-06-07 DIAGNOSIS — E1122 Type 2 diabetes mellitus with diabetic chronic kidney disease: Secondary | ICD-10-CM | POA: Insufficient documentation

## 2021-06-07 DIAGNOSIS — D126 Benign neoplasm of colon, unspecified: Secondary | ICD-10-CM | POA: Diagnosis not present

## 2021-06-07 DIAGNOSIS — K5730 Diverticulosis of large intestine without perforation or abscess without bleeding: Secondary | ICD-10-CM | POA: Diagnosis not present

## 2021-06-07 DIAGNOSIS — E785 Hyperlipidemia, unspecified: Secondary | ICD-10-CM | POA: Diagnosis not present

## 2021-06-07 DIAGNOSIS — N183 Chronic kidney disease, stage 3 unspecified: Secondary | ICD-10-CM | POA: Diagnosis not present

## 2021-06-07 DIAGNOSIS — I129 Hypertensive chronic kidney disease with stage 1 through stage 4 chronic kidney disease, or unspecified chronic kidney disease: Secondary | ICD-10-CM | POA: Diagnosis not present

## 2021-06-07 DIAGNOSIS — Z85828 Personal history of other malignant neoplasm of skin: Secondary | ICD-10-CM | POA: Diagnosis not present

## 2021-06-07 DIAGNOSIS — Z7984 Long term (current) use of oral hypoglycemic drugs: Secondary | ICD-10-CM | POA: Insufficient documentation

## 2021-06-07 DIAGNOSIS — Z1211 Encounter for screening for malignant neoplasm of colon: Secondary | ICD-10-CM | POA: Diagnosis not present

## 2021-06-07 DIAGNOSIS — Z8546 Personal history of malignant neoplasm of prostate: Secondary | ICD-10-CM | POA: Diagnosis not present

## 2021-06-07 DIAGNOSIS — D123 Benign neoplasm of transverse colon: Secondary | ICD-10-CM | POA: Insufficient documentation

## 2021-06-07 DIAGNOSIS — Z980 Intestinal bypass and anastomosis status: Secondary | ICD-10-CM | POA: Insufficient documentation

## 2021-06-07 DIAGNOSIS — Z08 Encounter for follow-up examination after completed treatment for malignant neoplasm: Secondary | ICD-10-CM | POA: Diagnosis not present

## 2021-06-07 DIAGNOSIS — Z85038 Personal history of other malignant neoplasm of large intestine: Secondary | ICD-10-CM | POA: Diagnosis not present

## 2021-06-07 DIAGNOSIS — K649 Unspecified hemorrhoids: Secondary | ICD-10-CM | POA: Diagnosis not present

## 2021-06-07 DIAGNOSIS — Z86010 Personal history of colonic polyps: Secondary | ICD-10-CM | POA: Diagnosis not present

## 2021-06-07 HISTORY — PX: COLONOSCOPY WITH PROPOFOL: SHX5780

## 2021-06-07 LAB — GLUCOSE, CAPILLARY: Glucose-Capillary: 133 mg/dL — ABNORMAL HIGH (ref 70–99)

## 2021-06-07 SURGERY — COLONOSCOPY WITH PROPOFOL
Anesthesia: General

## 2021-06-07 MED ORDER — PROPOFOL 500 MG/50ML IV EMUL
INTRAVENOUS | Status: DC | PRN
  Administered 2021-06-07: 145 ug/kg/min via INTRAVENOUS

## 2021-06-07 MED ORDER — SODIUM CHLORIDE 0.9 % IV SOLN: INTRAVENOUS | Status: DC

## 2021-06-07 MED ORDER — PROPOFOL 10 MG/ML IV BOLUS
INTRAVENOUS | Status: DC | PRN
  Administered 2021-06-07: 60 mg via INTRAVENOUS

## 2021-06-07 MED ORDER — PROPOFOL 500 MG/50ML IV EMUL
INTRAVENOUS | Status: AC
  Filled 2021-06-07: qty 50

## 2021-06-07 NOTE — H&P (Signed)
Outpatient short stay form Pre-procedure ?06/07/2021  ?Lesly Rubenstein, MD ? ?Primary Physician: Ezequiel Kayser, MD ? ?Reason for visit:  Surveillance ? ?History of present illness:   ? ?81 y/o gentleman with history of hypertension and personal history of colon cancer in 1998 here for colonoscopy for surveillance. Last colonoscopy was a little over one year ago and had fair prep so here for repeat. No blood thinners. Thinks one of his brothers may have had colon cancer. ? ? ? ?Current Facility-Administered Medications:  ?  0.9 %  sodium chloride infusion, , Intravenous, Continuous, Lailyn Appelbaum, Hilton Cork, MD, Last Rate: 20 mL/hr at 06/07/21 0751, New Bag at 06/07/21 0751 ? ?Medications Prior to Admission  ?Medication Sig Dispense Refill Last Dose  ? aspirin 81 MG chewable tablet    Past Week  ? hydrochlorothiazide (HYDRODIURIL) 25 MG tablet Take 25 mg by mouth daily.   06/07/2021 at 0600  ? lisinopril (PRINIVIL,ZESTRIL) 10 MG tablet Take 10 mg by mouth daily.    06/07/2021 at 0600  ? ACCU-CHEK FASTCLIX LANCETS MISC      ? atorvastatin (LIPITOR) 40 MG tablet Take 40 mg by mouth at bedtime.    06/05/2021  ? Blood Glucose Monitoring Suppl (TRUE METRIX AIR GLUCOSE METER) w/Device KIT      ? metFORMIN (GLUCOPHAGE) 500 MG tablet Take 500 mg by mouth 2 (two) times daily.    06/05/2021  ? Multiple Vitamin (MULTIVITAMIN WITH MINERALS) TABS tablet Take 1 tablet by mouth daily.   06/05/2021  ? Polyethyl Glycol-Propyl Glycol (LUBRICANT EYE DROPS) 0.4-0.3 % SOLN Place 1 drop into both eyes 3 (three) times daily as needed (dry/irritated eyes.).     ? TRUE METRIX BLOOD GLUCOSE TEST test strip      ? ? ? ?Allergies  ?Allergen Reactions  ? Tape Rash  ? ? ? ?Past Medical History:  ?Diagnosis Date  ? Arthritis   ? Basal cell carcinoma 2008  ? forehead  ? Chronic renal insufficiency, stage III (moderate) (HCC)   ? Colon cancer ([place]) 1998  ? Diabetes mellitus without complication ([place])   ? Controlled with diet only as of 11/01/2016  ? Diffuse  large B cell lymphoma ([place])   ? H/O onychomycosis   ? History of kidney stones   ? Hyperlipemia   ? Hypertension   ? Lyme disease   ? [facility]  ? ? ?Review of systems:  Otherwise negative.  ? ? ?Physical Exam ? ?Gen: Alert, oriented. Appears stated age.  ?HEENT: PERRLA. ?Lungs: No respiratory distress ?CV: RRR ?Abd: soft, benign, no masses ?Ext: No edema ? ? ? ?Planned procedures: Proceed with colonoscopy. The patient understands the nature of the planned procedure, indications, risks, alternatives and potential complications including but not limited to bleeding, infection, perforation, damage to internal organs and possible oversedation/side effects from anesthesia. The patient agrees and gives consent to proceed.  ?Please refer to procedure notes for findings, recommendations and patient disposition/instructions.  ? ? ? ?Lesly Rubenstein, MD ?[facility] ? ? ? ?  ? ?

## 2021-06-07 NOTE — Anesthesia Preprocedure Evaluation (Signed)
Anesthesia Evaluation  ?Patient identified by MRN, date of birth, ID band ?Patient awake ? ? ? ?Reviewed: ?Allergy & Precautions, H&P , NPO status , Patient's Chart, lab work & pertinent test results, reviewed documented beta blocker date and time  ? ?History of Anesthesia Complications ?Negative for: history of anesthetic complications ? ?Airway ?Mallampati: II ? ?TM Distance: >3 FB ?Neck ROM: full ? ? ? Dental ? ?(+) Caps, Chipped ?  ?Pulmonary ?neg shortness of breath, neg sleep apnea, neg COPD, neg recent URI, former smoker,  ?  ? ? ? ? ? ? ? Cardiovascular ?Exercise Tolerance: Good ?hypertension, On Medications ?(-) angina(-) CAD, (-) Past MI, (-) Cardiac Stents and (-) CABG (-) dysrhythmias (-) Valvular Problems/Murmurs ? ? ?  ?Neuro/Psych ?negative neurological ROS ? negative psych ROS  ? GI/Hepatic ?negative GI ROS, Fatty liver disease ?  ?Endo/Other  ?diabetes, Well Controlled, Oral Hypoglycemic Agents ? Renal/GU ?CRFRenal disease  ?negative genitourinary ?  ?Musculoskeletal ? ? Abdominal ?  ?Peds ? Hematology ?negative hematology ROS ?(+)   ?Anesthesia Other Findings ?Past Medical History: ?  Cancer [facility])                                               ?  Basal cell carcinoma                                       ?  Chronic renal insufficiency, stage III ([place]*            ?  Hypertension                                               ?  Colon cancer ([place])                                         ?  Lyme disease                                               ?  Hyperlipemia                                               ?  Prostate cancer ([place])                                      ? ? Reproductive/Obstetrics ?negative OB ROS ? ?  ? ? ? ? ? ? ? ? ? ? ? ? ? ?  ?  ? ? ? ? ? ? ? ? ?Anesthesia Physical ? ?Anesthesia Plan ? ?ASA: III ? ?Anesthesia Plan: General  ? ?Post-op Pain Management:   ? ?Induction: Intravenous ? ?PONV Risk Score and Plan: 2 and Propofol infusion and  TIVA ? ?Airway Management Planned: Nasal Cannula ? ?Additional Equipment:  ? ?Intra-op Plan:  ? ?Post-operative Plan:  ? ?Informed Consent: I have reviewed the patients History and Physical, chart, labs and discussed the procedure including the risks, benefits and alternatives for the proposed anesthesia with the patient or authorized representative who has indicated his/her understanding and acceptance.  ? ? ? ?Dental Advisory Given ? ?Plan Discussed with: Anesthesiologist, CRNA and Surgeon ? ?Anesthesia Plan Comments: (Patient consented for risks of anesthesia including but not limited to:  ?- adverse reactions to medications ?- risk of airway placement if required ?- damage to eyes, teeth, lips or other oral mucosa ?- nerve damage due to positioning  ?- sore throat or hoarseness ?- Damage to heart, brain, nerves, lungs, other parts of body or loss of life ? ?Patient voiced understanding.)  ? ? ? ? ? ? ?Anesthesia Quick Evaluation ? ?

## 2021-06-07 NOTE — Transfer of Care (Signed)
Immediate Anesthesia Transfer of Care Note ? ?Patient: Christian Kelly ? ?Procedure(s) Performed: COLONOSCOPY WITH PROPOFOL ? ?Patient Location: PACU ? ?Anesthesia Type:General ? ?Level of Consciousness: awake and alert  ? ?Airway & Oxygen Therapy: Patient Spontanous Breathing and Patient connected to nasal cannula oxygen ? ?Post-op Assessment: Report given to RN and Post -op Vital signs reviewed and stable ? ?Post vital signs: Reviewed and stable ? ?Last Vitals:  ?Vitals Value Taken Time  ?BP 101/65 06/07/21 0854  ?Temp 35.8 ?C 06/07/21 0854  ?Pulse 66 06/07/21 0855  ?Resp 14 06/07/21 0855  ?SpO2 93 % 06/07/21 0855  ?Vitals shown include unvalidated device data. ? ?Last Pain:  ?Vitals:  ? 06/07/21 0854  ?TempSrc: Tympanic  ?PainSc: Asleep  ?   ? ?  ? ?Complications: No notable events documented. ?

## 2021-06-07 NOTE — Interval H&P Note (Signed)
History and Physical Interval Note: ? ?06/07/2021 ?8:29 AM ? ?Christian Kelly  has presented today for surgery, with the diagnosis of HX OF COLON CANCER.  The various methods of treatment have been discussed with the patient and family. After consideration of risks, benefits and other options for treatment, the patient has consented to  Procedure(s) with comments: ?COLONOSCOPY WITH PROPOFOL (N/A) - DM as a surgical intervention.  The patient's history has been reviewed, patient examined, no change in status, stable for surgery.  I have reviewed the patient's chart and labs.  Questions were answered to the patient's satisfaction.   ? ? ?Christian Kelly [name] ? ?Ok to proceed with colonoscopy ?

## 2021-06-07 NOTE — Op Note (Signed)
[facility] ?Gastroenterology ?Patient Name: Christian Kelly ?Procedure Date: 06/07/2021 8:19 AM ?MRN: [id number] ?Account #: [id number] ?Date of Birth: 10/12/1940 ?Admit Type: Outpatient ?Age: 81 ?Room: [facility] ENDO ROOM 3 ?Gender: Male ?Note Status: Finalized ?Instrument Name: Colonoscope [id number] ?Procedure:             Colonoscopy ?Indications:           Surveillance: History of adenomatous polyps,  ?                       inadequate prep on last exam (<65yr, High risk colon  ?                       cancer surveillance: Personal history of colon cancer ?Providers:             CAndrey FarmerMD, MD ?Referring MD:          DChristena Flake TRaechel Ache MD (Referring MD) ?Medicines:             Monitored Anesthesia Care ?Complications:         No immediate complications. Estimated blood loss:  ?                       Minimal. ?Procedure:             Pre-Anesthesia Assessment: ?                       - Prior to the procedure, a History and Physical was  ?                       performed, and patient medications and allergies were  ?                       reviewed. The patient is competent. The risks and  ?                       benefits of the procedure and the sedation options and  ?                       risks were discussed with the patient. All questions  ?                       were answered and informed consent was obtained.  ?                       Patient identification and proposed procedure were  ?                       verified by the physician, the nurse, the  ?                       anesthesiologist, the anesthetist and the technician  ?                       in the endoscopy suite. Mental Status Examination:  ?                       alert and oriented. Airway Examination: normal  ?  oropharyngeal airway and neck mobility. Respiratory  ?                       Examination: clear to auscultation. CV Examination:  ?                       normal. Prophylactic Antibiotics: The patient does  not  ?                       require prophylactic antibiotics. Prior  ?                       Anticoagulants: The patient has taken no previous  ?                       anticoagulant or antiplatelet agents. ASA Grade  ?                       Assessment: II - A patient with mild systemic disease.  ?                       After reviewing the risks and benefits, the patient  ?                       was deemed in satisfactory condition to undergo the  ?                       procedure. The anesthesia plan was to use monitored  ?                       anesthesia care (MAC). Immediately prior to  ?                       administration of medications, the patient was  ?                       re-assessed for adequacy to receive sedatives. The  ?                       heart rate, respiratory rate, oxygen saturations,  ?                       blood pressure, adequacy of pulmonary ventilation, and  ?                       response to care were monitored throughout the  ?                       procedure. The physical status of the patient was  ?                       re-assessed after the procedure. ?                       After obtaining informed consent, the colonoscope was  ?                       passed under direct vision. Throughout the procedure,  ?  the patient's blood pressure, pulse, and oxygen  ?                       saturations were monitored continuously. The  ?                       Colonoscope was introduced through the anus and  ?                       advanced to the the cecum, identified by appendiceal  ?                       orifice and ileocecal valve. The colonoscopy was  ?                       performed without difficulty. The patient tolerated  ?                       the procedure well. The quality of the bowel  ?                       preparation was good. ?Findings: ?     The perianal and digital rectal examinations were normal. ?     A 2 mm polyp was found in the transverse  colon. The polyp was sessile.  ?     The polyp was removed with a cold snare. Resection and retrieval were  ?     complete. Estimated blood loss was minimal. ?     A single small-mouthed diverticulum was found in the sigmoid colon. ?     There was evidence of a prior end-to-end colo-colonic anastomosis in the  ?     sigmoid colon. This was patent. ?     Internal hemorrhoids were found during retroflexion. The hemorrhoids  ?     were Grade I (internal hemorrhoids that do not prolapse). ?     The exam was otherwise without abnormality on direct and retroflexion  ?     views. ?Impression:            - One 2 mm polyp in the transverse colon, removed with  ?                       a cold snare. Resected and retrieved. ?                       - Diverticulosis in the sigmoid colon. ?                       - Patent end-to-end colo-colonic anastomosis. ?                       - Internal hemorrhoids. ?                       - The examination was otherwise normal on direct and  ?                       retroflexion views. ?Recommendation:        - Discharge patient to home. ?                       -  Resume previous diet. ?                       - Continue present medications. ?                       - Await pathology results. ?                       - Repeat colonoscopy is not recommended due to current  ?                       age (75 years or older) for surveillance. ?                       - Return to referring physician as previously  ?                       scheduled. ?Procedure Code(s):     --- Professional --- ?                       [phone], Colonoscopy, flexible; with removal of  ?                       tumor(s), polyp(s), or other lesion(s) by snare  ?                       technique ?Diagnosis Code(s):     --- Professional --- ?                       Z86.010, Personal history of colonic polyps ?                       Z85.038, Personal history of other malignant neoplasm  ?                       of large intestine ?                        K63.5, Polyp of colon ?                       K64.0, First degree hemorrhoids ?                       Z98.0, Intestinal bypass and anastomosis status ?                       K57.30, Diverticulosis of large intestine without  ?                       perforation or abscess without bleeding ?CPT copyright 2019 American Medical Association. All rights reserved. ?The codes documented in this report are preliminary and upon coder review may  ?be revised to meet current compliance requirements. ?Andrey Farmer MD, MD ?06/07/2021 8:55:49 AM ?Number of Addenda: 0 ?Note Initiated On: 06/07/2021 8:19 AM ?Scope Withdrawal Time: 0 hours 11 minutes 41 seconds  ?Total Procedure Duration: 0 hours 15 minutes 20 seconds  ?Estimated Blood Loss:  Estimated blood loss was minimal. ?     [facility] ?

## 2021-06-07 NOTE — Anesthesia Postprocedure Evaluation (Signed)
Anesthesia Post Note ? ?Patient: Elison Worrel ? ?Procedure(s) Performed: COLONOSCOPY WITH PROPOFOL ? ?Patient location during evaluation: Endoscopy ?Anesthesia Type: General ?Level of consciousness: awake and alert ?Pain management: pain level controlled ?Vital Signs Assessment: post-procedure vital signs reviewed and stable ?Respiratory status: spontaneous breathing, nonlabored ventilation, respiratory function stable and patient connected to nasal cannula oxygen ?Cardiovascular status: blood pressure returned to baseline and stable ?Postop Assessment: no apparent nausea or vomiting ?Anesthetic complications: no ? ? ?No notable events documented. ? ? ?Last Vitals:  ?Vitals:  ? 06/07/21 0914 06/07/21 0924  ?BP: 122/66 119/80  ?Pulse: 63 (!) 58  ?Resp: 19 13  ?Temp:    ?SpO2: 93% 96%  ?  ?Last Pain:  ?Vitals:  ? 06/07/21 0924  ?TempSrc:   ?PainSc: 0-No pain  ? ? ?  ?  ?  ?  ?  ?  ? ?Precious Haws Shanise Balch ? ? ? ? ?

## 2021-06-08 ENCOUNTER — Encounter: Payer: Self-pay | Admitting: Gastroenterology

## 2021-06-08 LAB — SURGICAL PATHOLOGY

## 2021-06-15 ENCOUNTER — Other Ambulatory Visit: Payer: Self-pay | Admitting: *Deleted

## 2021-06-15 DIAGNOSIS — C61 Malignant neoplasm of prostate: Secondary | ICD-10-CM

## 2021-06-15 NOTE — Progress Notes (Signed)
Orders entered

## 2021-06-16 ENCOUNTER — Encounter: Payer: Self-pay | Admitting: Oncology

## 2021-06-16 ENCOUNTER — Other Ambulatory Visit: Payer: Self-pay

## 2021-06-16 ENCOUNTER — Inpatient Hospital Stay: Payer: Medicare HMO | Attending: Oncology

## 2021-06-16 ENCOUNTER — Inpatient Hospital Stay (HOSPITAL_BASED_OUTPATIENT_CLINIC_OR_DEPARTMENT_OTHER): Payer: Medicare HMO | Admitting: Oncology

## 2021-06-16 ENCOUNTER — Telehealth: Payer: Self-pay

## 2021-06-16 VITALS — BP 127/62 | HR 57 | Temp 96.4°F | Resp 18 | Wt 191.7 lb

## 2021-06-16 DIAGNOSIS — Z8546 Personal history of malignant neoplasm of prostate: Secondary | ICD-10-CM | POA: Diagnosis not present

## 2021-06-16 DIAGNOSIS — Z8639 Personal history of other endocrine, nutritional and metabolic disease: Secondary | ICD-10-CM | POA: Diagnosis not present

## 2021-06-16 DIAGNOSIS — E538 Deficiency of other specified B group vitamins: Secondary | ICD-10-CM | POA: Diagnosis not present

## 2021-06-16 DIAGNOSIS — Z8579 Personal history of other malignant neoplasms of lymphoid, hematopoietic and related tissues: Secondary | ICD-10-CM | POA: Insufficient documentation

## 2021-06-16 DIAGNOSIS — C61 Malignant neoplasm of prostate: Secondary | ICD-10-CM

## 2021-06-16 DIAGNOSIS — I700 Atherosclerosis of aorta: Secondary | ICD-10-CM | POA: Diagnosis not present

## 2021-06-16 DIAGNOSIS — Z09 Encounter for follow-up examination after completed treatment for conditions other than malignant neoplasm: Secondary | ICD-10-CM | POA: Diagnosis not present

## 2021-06-16 DIAGNOSIS — Z08 Encounter for follow-up examination after completed treatment for malignant neoplasm: Secondary | ICD-10-CM

## 2021-06-16 DIAGNOSIS — Z9079 Acquired absence of other genital organ(s): Secondary | ICD-10-CM | POA: Insufficient documentation

## 2021-06-16 DIAGNOSIS — E1122 Type 2 diabetes mellitus with diabetic chronic kidney disease: Secondary | ICD-10-CM | POA: Diagnosis not present

## 2021-06-16 DIAGNOSIS — Z8509 Personal history of malignant neoplasm of other digestive organs: Secondary | ICD-10-CM | POA: Insufficient documentation

## 2021-06-16 DIAGNOSIS — Z9221 Personal history of antineoplastic chemotherapy: Secondary | ICD-10-CM | POA: Diagnosis not present

## 2021-06-16 DIAGNOSIS — Z87891 Personal history of nicotine dependence: Secondary | ICD-10-CM | POA: Diagnosis not present

## 2021-06-16 DIAGNOSIS — E1169 Type 2 diabetes mellitus with other specified complication: Secondary | ICD-10-CM | POA: Diagnosis not present

## 2021-06-16 DIAGNOSIS — N182 Chronic kidney disease, stage 2 (mild): Secondary | ICD-10-CM | POA: Diagnosis not present

## 2021-06-16 LAB — MAGNESIUM: Magnesium: 1.6 mg/dL — ABNORMAL LOW (ref 1.7–2.4)

## 2021-06-16 LAB — CBC WITH DIFFERENTIAL/PLATELET
Abs Immature Granulocytes: 0.01 10*3/uL (ref 0.00–0.07)
Basophils Absolute: 0 10*3/uL (ref 0.0–0.1)
Basophils Relative: 1 %
Eosinophils Absolute: 0.2 10*3/uL (ref 0.0–0.5)
Eosinophils Relative: 4 %
HCT: 39.6 % (ref 39.0–52.0)
Hemoglobin: 13.1 g/dL (ref 13.0–17.0)
Immature Granulocytes: 0 %
Lymphocytes Relative: 20 %
Lymphs Abs: 1.2 10*3/uL (ref 0.7–4.0)
MCH: 31.3 pg (ref 26.0–34.0)
MCHC: 33.1 g/dL (ref 30.0–36.0)
MCV: 94.7 fL (ref 80.0–100.0)
Monocytes Absolute: 0.6 10*3/uL (ref 0.1–1.0)
Monocytes Relative: 10 %
Neutro Abs: 4 10*3/uL (ref 1.7–7.7)
Neutrophils Relative %: 65 %
Platelets: 188 10*3/uL (ref 150–400)
RBC: 4.18 MIL/uL — ABNORMAL LOW (ref 4.22–5.81)
RDW: 12.4 % (ref 11.5–15.5)
WBC: 6.1 10*3/uL (ref 4.0–10.5)
nRBC: 0 % (ref 0.0–0.2)

## 2021-06-16 LAB — COMPREHENSIVE METABOLIC PANEL
ALT: 25 U/L (ref 0–44)
AST: 24 U/L (ref 15–41)
Albumin: 3.7 g/dL (ref 3.5–5.0)
Alkaline Phosphatase: 72 U/L (ref 38–126)
Anion gap: 9 (ref 5–15)
BUN: 25 mg/dL — ABNORMAL HIGH (ref 8–23)
CO2: 30 mmol/L (ref 22–32)
Calcium: 8.9 mg/dL (ref 8.9–10.3)
Chloride: 100 mmol/L (ref 98–111)
Creatinine, Ser: 1.24 mg/dL (ref 0.61–1.24)
GFR, Estimated: 60 mL/min — ABNORMAL LOW (ref >60)
Glucose, Bld: 148 mg/dL — ABNORMAL HIGH (ref 70–99)
Potassium: 4.1 mmol/L (ref 3.5–5.1)
Sodium: 139 mmol/L (ref 135–145)
Total Bilirubin: 0.8 mg/dL (ref 0.3–1.2)
Total Protein: 6.8 g/dL (ref 6.5–8.1)

## 2021-06-16 LAB — LACTATE DEHYDROGENASE: LDH: 127 U/L (ref 98–192)

## 2021-06-16 LAB — PSA: Prostatic Specific Antigen: 0.06 ng/mL (ref 0.00–4.00)

## 2021-06-16 LAB — HEMOGLOBIN A1C
Hgb A1c MFr Bld: 7.9 % — ABNORMAL HIGH (ref 4.8–5.6)
Mean Plasma Glucose: 180.03 mg/dL

## 2021-06-16 NOTE — Telephone Encounter (Signed)
Requested labs from PCP's office has been faxed to [phone] and have receieved a fax confirmation.  ? ? ?

## 2021-06-16 NOTE — Progress Notes (Signed)
? ? ? ?Hematology/Oncology Consult note ?[place]  ?[phone] [phone] ? ?Patient Care Team: ?Ezequiel Kayser, MD as PCP - General (Internal Medicine) ?Hollice Espy, MD as Consulting Physician (Urology) ?Sindy Guadeloupe, MD as Consulting Physician (Oncology) ?Noreene Filbert, MD as Referring Physician (Radiation Oncology)  ? ?Name of the patient: Christian Kelly  ?[id number]  ?1940-11-13  ? ?Date of visit: 06/16/21 ? ?Diagnosis- [place] sensitive nonmetastatic prostate cancer with biochemical recurrence ?  ?2. Stage II DLBCL GCB. FISH for double hit negative in CR1  ?  ? ?Chief complaint/ Reason for visit-routine follow-up of lymphoma and prostate cancer ? ?Heme/Onc history:  1. Patient is a 81 yr old male with a h/o prostate cancer diagnosed in 1999 s/p radical prostatectomy. Prior to that he had colon cancer in 1998 s/o surgery and adjuvant chemotherapy in 1998. With regards to prostate cancer- surgery was done at [facility] in [place] and he was followed at [place] subsequently. Patient think his Gleasons score was 7 at diagnosis. He has not required any radiation therapy or ADT so far. Patient still spends 4-5 months at [place].  ?  ?2. Dr. Raechel Ache has been monitoring his PSArecently  and his recent trend of PSA has been as follows: ?psa was 0.33 in feb 2017 and 0.63 in April 2018. We have 1 psa from 2015 when it was 0.3 ?  ?3. Patient was referred to Dr. Erlene Quan urology and Dr. Baruch Gouty for salvage radiation.  ?  ?4. Dr. Erlene Quan ordered CT abdomen which showed: IMPRESSION: ?1. Status post prostatectomy, without locally recurrent disease. ?2. Extensive abdominal adenopathy. Given the clinical history, most ?likely related to metastatic prostate carcinoma. Lymphoma could look ?similar. ?3.  Coronary artery atherosclerosis. Aortic atherosclerosis. ?4. Vague upper sacral sclerosis is felt unlikely to represent ?metastatic disease, given lack of correlate on  yesterday's bone ?scan. Correlate with radiation therapy, as radiation induced ?necrosis could have this appearance. Recommend attention on ?follow-up. ?  ?5. This was followed by PET/CT scan which showed: IMPRESSION: ?1. Hypermetabolic gastrohepatic ligament, abdominal retroperitoneal ?and small bowel mesenteric adenopathy, most indicative of lymphoma. ?2. Aortic atherosclerosis (ICD10-170.0). Coronary artery ?Calcification. ?  ?6. Patient underwent CT-guided biopsy of the mesenteric lymph node which showed:DIAGNOSIS:  ?A. LYMPH NODE, MESENTERIC; CT-GUIDED CORE BIOPSY:  ?- LARGE B-CELL LYMPHOMA, CD10 POSITIVE.  ? ?Comment:  ?There is a diffuse proliferation of large lymphocytes with irregular  ?nuclear contours, inconspicuous nucleoli, and scant cytoplasm. The flow  ?cytometry and IHC results are most consistent with diffuse large B-cell  ?lymphoma, not otherwise specified, germinal center B-cell type. However,  ?it is likely that Roma for MYC, BCL2 and BCL6 gene rearrangements will  ?be ordered, so final classification will be based on the [facility] result ?  ?7. Bone marrow biopsy was negative for lymphoma ?  ?8. 17% of nuclei were positive for BCL-2 rearrangement. 35% of nuclei had extra myc signals but no gene rearrangement for cmyc noted. This was consistent with evolved lymphoma or DLBCL. Not consistent with with double hit or double expressor ?  ?9. Interim scans after 3 cycles of RCHOP showed: IMPRESSION: ?2.2 x 5.3 cm jejunal mesenteric nodal mass along the anterior mid ?abdomen, significantly improved. ?  ?  ?10.  PET/CT scan after 6 cycles of therapy showed mesenteric nodule mass was 5.9 x 2.3 cm with an SUV of 2.7.  This was discussed at tumor board.  Likelihood of this representing residual lymphoma is low given the low SUV uptake which  has been similar to prior PET scan 3 months ago.  Patient then received RT to his mesenteric mass.  He has been in remission since then ?  ? ?Interval history-patient is  currently doing well and denies any specific complaints at this time.  Appetite and weight have remained stable.  Denies any new aches and pains anywhere ? ?ECOG PS- 1 ?Pain scale- 0 ? ? ?Review of systems- Review of Systems  ?Constitutional:  Negative for chills, fever, malaise/fatigue and weight loss.  ?HENT:  Negative for congestion, ear discharge and nosebleeds.   ?Eyes:  Negative for blurred vision.  ?Respiratory:  Negative for cough, hemoptysis, sputum production, shortness of breath and wheezing.   ?Cardiovascular:  Negative for chest pain, palpitations, orthopnea and claudication.  ?Gastrointestinal:  Negative for abdominal pain, blood in stool, constipation, diarrhea, heartburn, melena, nausea and vomiting.  ?Genitourinary:  Negative for dysuria, flank pain, frequency, hematuria and urgency.  ?Musculoskeletal:  Negative for back pain, joint pain and myalgias.  ?Skin:  Negative for rash.  ?Neurological:  Negative for dizziness, tingling, focal weakness, seizures, weakness and headaches.  ?Endo/Heme/Allergies:  Does not bruise/bleed easily.  ?Psychiatric/Behavioral:  Negative for depression and suicidal ideas. The patient does not have insomnia.    ? ? ?Allergies  ?Allergen Reactions  ? Tape Rash  ? ? ? ?Past Medical History:  ?Diagnosis Date  ? Arthritis   ? Basal cell carcinoma 2008  ? forehead  ? Chronic renal insufficiency, stage III (moderate) (HCC)   ? Colon cancer ([place]) 1998  ? Diabetes mellitus without complication ([place])   ? Controlled with diet only as of 11/01/2016  ? Diffuse large B cell lymphoma ([place])   ? H/O onychomycosis   ? History of kidney stones   ? Hyperlipemia   ? Hypertension   ? Lyme disease   ? Prostate cancer [facility]) 1999  ? ? ? ?Past Surgical History:  ?Procedure Laterality Date  ? COLON SURGERY  1998  ? COLONOSCOPY    ? COLONOSCOPY WITH PROPOFOL N/A 12/29/2014  ? Procedure: COLONOSCOPY WITH PROPOFOL;  Surgeon: Lollie Sails, MD;  Location: [facility] ENDOSCOPY;  Service: Endoscopy;   Laterality: N/A;  ? COLONOSCOPY WITH PROPOFOL N/A 09/04/2017  ? Procedure: COLONOSCOPY WITH PROPOFOL;  Surgeon: Lollie Sails, MD;  Location[facility] ENDOSCOPY;  Service: Endoscopy;  Laterality: N/A;  ? COLONOSCOPY WITH PROPOFOL N/A 01/28/2020  ? Procedure: COLONOSCOPY WITH PROPOFOL;  Surgeon: Lesly Rubenstein, MD;  Location: [facility] ENDOSCOPY;  Service: Endoscopy;  Laterality: N/A;  ? COLONOSCOPY WITH PROPOFOL N/A 06/07/2021  ? Procedure: COLONOSCOPY WITH PROPOFOL;  Surgeon: Lesly Rubenstein, MD;  Location: [facility] ENDOSCOPY;  Service: Endoscopy;  Laterality: N/A;  DM  ? CYSTOSCOPY W/ RETROGRADES Bilateral 03/14/2019  ? Procedure: CYSTOSCOPY WITH RETROGRADE PYELOGRAM;  Surgeon: Hollice Espy, MD;  Location: [facility] ORS;  Service: Urology;  Laterality: Bilateral;  ? CYSTOSCOPY WITH BIOPSY N/A 03/14/2019  ? Procedure: CYSTOSCOPY WITH bladder BIOPSY;  Surgeon: Hollice Espy, MD;  Location: [facility] ORS;  Service: Urology;  Laterality: N/A;  ? EYE SURGERY Left   ? MACULAR HOLE  ? EYE SURGERY Right   ? TORN RETINA  ? FRACTURE SURGERY Left   ? ARM  ? IR FLUORO GUIDE PORT INSERTION RIGHT  09/01/2016  ? IR REMOVAL TUN ACCESS W/ PORT W/O FL MOD SED  03/28/2018  ? LITHOTRIPSY    ? PROSTATECTOMY  1998  ? TONSILLECTOMY    ? ? ?Social History  ? ?Socioeconomic History  ? Marital status:  Married  ?  Spouse name: Marcie Bal   ? Number of children: 3  ? Years of education: Not on file  ? Highest education level: Not on file  ?Occupational History  ? Occupation: retired   ?  Comment: Tax adviser  ?Tobacco Use  ? Smoking status: Former  ?  Packs/day: 1.00  ?  Years: 8.00  ?  Pack years: 8.00  ?  Types: Cigarettes  ?  Quit date: 08/13/1974  ?  Years since quitting: 46.8  ? Smokeless tobacco: Former  ?  Types: Snuff  ?Vaping Use  ? Vaping Use: Never used  ?Substance and Sexual Activity  ? Alcohol use: Yes  ?  Comment: rarely-one or two beer  ? Drug use: No  ? Sexual activity: Not Currently  ?Other Topics Concern  ? Not on file  ?Social  History Narrative  ? Not on file  ? ?Social Determinants of Health  ? ?Financial Resource Strain: Not on file  ?Food Insecurity: Not on file  ?Transportation Needs: Not on file  ?Physical Activity: Not on file  ?Stress:

## 2021-06-17 DIAGNOSIS — E119 Type 2 diabetes mellitus without complications: Secondary | ICD-10-CM | POA: Diagnosis not present

## 2021-06-17 DIAGNOSIS — H26493 Other secondary cataract, bilateral: Secondary | ICD-10-CM | POA: Diagnosis not present

## 2021-06-17 DIAGNOSIS — H40013 Open angle with borderline findings, low risk, bilateral: Secondary | ICD-10-CM | POA: Diagnosis not present

## 2021-06-17 DIAGNOSIS — H35342 Macular cyst, hole, or pseudohole, left eye: Secondary | ICD-10-CM | POA: Diagnosis not present

## 2021-06-18 ENCOUNTER — Encounter: Payer: Self-pay | Admitting: Oncology

## 2021-06-22 ENCOUNTER — Telehealth: Payer: Self-pay | Admitting: *Deleted

## 2021-06-22 NOTE — Telephone Encounter (Signed)
Hey Mr. Brozowski, ?Dr. Janese Banks reached out to Dr. Erlene Quan and discussed the PSA. Dr. Erlene Quan said that it is so little of increase that if they if did scans that it is too minute that it would not show anything. She told Janese Banks that we should wait til October and then make a decision about going back on injections or if you need a scan. Let me know if you have questions. ?Thanks ?sherry ?  ?

## 2021-07-16 DIAGNOSIS — S91331A Puncture wound without foreign body, right foot, initial encounter: Secondary | ICD-10-CM | POA: Diagnosis not present

## 2021-07-16 DIAGNOSIS — S99929A Unspecified injury of unspecified foot, initial encounter: Secondary | ICD-10-CM | POA: Diagnosis not present

## 2021-07-16 DIAGNOSIS — W268XXA Contact with other sharp object(s), not elsewhere classified, initial encounter: Secondary | ICD-10-CM | POA: Diagnosis not present

## 2021-07-16 DIAGNOSIS — S99921A Unspecified injury of right foot, initial encounter: Secondary | ICD-10-CM | POA: Diagnosis not present

## 2021-07-21 DIAGNOSIS — S91301A Unspecified open wound, right foot, initial encounter: Secondary | ICD-10-CM | POA: Diagnosis not present

## 2021-12-21 ENCOUNTER — Encounter: Payer: Self-pay | Admitting: Physician Assistant

## 2021-12-21 ENCOUNTER — Inpatient Hospital Stay (HOSPITAL_BASED_OUTPATIENT_CLINIC_OR_DEPARTMENT_OTHER): Payer: Medicare HMO | Admitting: Oncology

## 2021-12-21 ENCOUNTER — Inpatient Hospital Stay: Payer: Medicare HMO | Attending: Oncology

## 2021-12-21 ENCOUNTER — Encounter: Payer: Self-pay | Admitting: Oncology

## 2021-12-21 ENCOUNTER — Other Ambulatory Visit: Payer: Medicare HMO

## 2021-12-21 VITALS — BP 112/56 | HR 65 | Temp 98.1°F | Resp 16 | Wt 183.9 lb

## 2021-12-21 DIAGNOSIS — Z8579 Personal history of other malignant neoplasms of lymphoid, hematopoietic and related tissues: Secondary | ICD-10-CM | POA: Diagnosis not present

## 2021-12-21 DIAGNOSIS — C61 Malignant neoplasm of prostate: Secondary | ICD-10-CM | POA: Diagnosis not present

## 2021-12-21 DIAGNOSIS — Z87891 Personal history of nicotine dependence: Secondary | ICD-10-CM | POA: Insufficient documentation

## 2021-12-21 DIAGNOSIS — Z08 Encounter for follow-up examination after completed treatment for malignant neoplasm: Secondary | ICD-10-CM

## 2021-12-21 LAB — CBC WITH DIFFERENTIAL/PLATELET
Abs Immature Granulocytes: 0.04 10*3/uL (ref 0.00–0.07)
Basophils Absolute: 0.1 10*3/uL (ref 0.0–0.1)
Basophils Relative: 1 %
Eosinophils Absolute: 0.2 10*3/uL (ref 0.0–0.5)
Eosinophils Relative: 2 %
HCT: 39.2 % (ref 39.0–52.0)
Hemoglobin: 12.8 g/dL — ABNORMAL LOW (ref 13.0–17.0)
Immature Granulocytes: 0 %
Lymphocytes Relative: 16 %
Lymphs Abs: 1.4 10*3/uL (ref 0.7–4.0)
MCH: 30.8 pg (ref 26.0–34.0)
MCHC: 32.7 g/dL (ref 30.0–36.0)
MCV: 94.5 fL (ref 80.0–100.0)
Monocytes Absolute: 0.7 10*3/uL (ref 0.1–1.0)
Monocytes Relative: 7 %
Neutro Abs: 6.5 10*3/uL (ref 1.7–7.7)
Neutrophils Relative %: 74 %
Platelets: 262 10*3/uL (ref 150–400)
RBC: 4.15 MIL/uL — ABNORMAL LOW (ref 4.22–5.81)
RDW: 12.2 % (ref 11.5–15.5)
WBC: 8.9 10*3/uL (ref 4.0–10.5)
nRBC: 0 % (ref 0.0–0.2)

## 2021-12-21 LAB — COMPREHENSIVE METABOLIC PANEL
ALT: 27 U/L (ref 0–44)
AST: 23 U/L (ref 15–41)
Albumin: 3.7 g/dL (ref 3.5–5.0)
Alkaline Phosphatase: 84 U/L (ref 38–126)
Anion gap: 8 (ref 5–15)
BUN: 31 mg/dL — ABNORMAL HIGH (ref 8–23)
CO2: 28 mmol/L (ref 22–32)
Calcium: 9.1 mg/dL (ref 8.9–10.3)
Chloride: 102 mmol/L (ref 98–111)
Creatinine, Ser: 1.21 mg/dL (ref 0.61–1.24)
GFR, Estimated: >60 mL/min (ref >60)
Glucose, Bld: 168 mg/dL — ABNORMAL HIGH (ref 70–99)
Potassium: 4 mmol/L (ref 3.5–5.1)
Sodium: 138 mmol/L (ref 135–145)
Total Bilirubin: 0.7 mg/dL (ref 0.3–1.2)
Total Protein: 7.4 g/dL (ref 6.5–8.1)

## 2021-12-21 LAB — PSA: Prostatic Specific Antigen: 0.15 ng/mL (ref 0.00–4.00)

## 2021-12-21 LAB — LACTATE DEHYDROGENASE: LDH: 112 U/L (ref 98–192)

## 2021-12-21 NOTE — Progress Notes (Signed)
Hematology/Oncology Consult note [facility]  [phone] [phone]  Patient Care Team: Ezequiel Kayser, MD as PCP - General (Internal Medicine) Hollice Espy, MD as Consulting Physician (Urology) Sindy Guadeloupe, MD as Consulting Physician (Oncology) Noreene Filbert, MD as Referring Physician (Radiation Oncology)   Name of the patient: Christian Kelly  [id number]  [birth year]   Date of visit: 12/21/21  Diagnosis- Castrate sensitive nonmetastatic prostate cancer with biochemical recurrence   2. Stage II DLBCL GCB. FISH for double hit negative in CR1     Chief complaint/ Reason for visit-routine follow-up of lymphoma and prostate cancer  Heme/Onc history: 1. Patient is a 81 yr old male with a h/o prostate cancer diagnosed in 1999 s/p radical prostatectomy. Prior to that he had colon cancer in 1998 s/o surgery and adjuvant chemotherapy in 1998. With regards to prostate cancer- surgery was done at [facility] in [place] and he was followed at [place] subsequently. Patient think his Gleasons score was 7 at diagnosis. He has not required any radiation therapy or ADT so far. 2. Dr. Raechel Ache has been monitoring his PSArecently  and his recent trend of PSA has been as follows: psa was 0.33 in feb 2017 and 0.63 in April 2018. We have 1 psa from 2015 when it was 0.3   3. Patient was referred to Dr. Erlene Quan urology and Dr. Baruch Gouty for salvage radiation.    4. Dr. Erlene Quan ordered CT abdomen which showed: IMPRESSION: 1. Status post prostatectomy, without locally recurrent disease. 2. Extensive abdominal adenopathy. Given the clinical history, most likely related to metastatic prostate carcinoma. Lymphoma could look similar. 3.  Coronary artery atherosclerosis. Aortic atherosclerosis. 4. Vague upper sacral sclerosis is felt unlikely to represent metastatic disease, given lack of correlate on yesterday's bone scan. Correlate with radiation  therapy, as radiation induced necrosis could have this appearance. Recommend attention on follow-up.   5. This was followed by PET/CT scan which showed: IMPRESSION: 1. Hypermetabolic gastrohepatic ligament, abdominal retroperitoneal and small bowel mesenteric adenopathy, most indicative of lymphoma. 2. Aortic atherosclerosis (ICD10-170.0). Coronary artery Calcification.   6. Patient underwent CT-guided biopsy of the mesenteric lymph node which showed:DIAGNOSIS:  A. LYMPH NODE, MESENTERIC; CT-GUIDED CORE BIOPSY:  - LARGE B-CELL LYMPHOMA, CD10 POSITIVE.   Comment:  There is a diffuse proliferation of large lymphocytes with irregular  nuclear contours, inconspicuous nucleoli, and scant cytoplasm. The flow  cytometry and IHC results are most consistent with diffuse large B-cell  lymphoma, not otherwise specified, germinal center B-cell type. However,  it is likely that [place] for MYC, BCL2 and BCL6 gene rearrangements will  be ordered, so final classification will be based on the [place] result   7. Bone marrow biopsy was negative for lymphoma   8. 17% of nuclei were positive for BCL-2 rearrangement. 35% of nuclei had extra myc signals but no gene rearrangement for cmyc noted. This was consistent with evolved lymphoma or DLBCL. Not consistent with with double hit or double expressor   9. Interim scans after 3 cycles of RCHOP showed: IMPRESSION: 2.2 x 5.3 cm jejunal mesenteric nodal mass along the anterior mid abdomen, significantly improved.     10.  PET/CT scan after 6 cycles of therapy showed mesenteric nodule mass was 5.9 x 2.3 cm with an SUV of 2.7.  This was discussed at tumor board.  Likelihood of this representing residual lymphoma is low given the low SUV uptake which has been similar to prior PET scan 3 months ago.  Patient then received RT to his mesenteric mass.  He has been in remission since then    Interval history-patient is feeling well overall.  Appetite and weight have  remained stable.  ECOG PS- 1 Pain scale- 0   Review of systems- Review of Systems  Constitutional:  Negative for chills, fever, malaise/fatigue and weight loss.  HENT:  Negative for congestion, ear discharge and nosebleeds.   Eyes:  Negative for blurred vision.  Respiratory:  Negative for cough, hemoptysis, sputum production, shortness of breath and wheezing.   Cardiovascular:  Negative for chest pain, palpitations, orthopnea and claudication.  Gastrointestinal:  Negative for abdominal pain, blood in stool, constipation, diarrhea, heartburn, melena, nausea and vomiting.  Genitourinary:  Negative for dysuria, flank pain, frequency, hematuria and urgency.  Musculoskeletal:  Negative for back pain, joint pain and myalgias.  Skin:  Negative for rash.  Neurological:  Negative for dizziness, tingling, focal weakness, seizures, weakness and headaches.  Endo/Heme/Allergies:  Does not bruise/bleed easily.  Psychiatric/Behavioral:  Negative for depression and suicidal ideas. The patient does not have insomnia.       Allergies  Allergen Reactions   Tape Rash     Past Medical History:  Diagnosis Date   Arthritis    Basal cell carcinoma 2008   forehead   Chronic renal insufficiency, stage III (moderate) (HCC)    Colon cancer ([place]) 1998   Diabetes mellitus without complication ([place])    Controlled with diet only as of 11/01/2016   Diffuse large B cell lymphoma (HCC)    H/O onychomycosis    History of kidney stones    Hyperlipemia    Hypertension    Lyme disease    Prostate cancer ([place]) 1999     Past Surgical History:  Procedure Laterality Date   COLON SURGERY  1998   COLONOSCOPY     COLONOSCOPY WITH PROPOFOL N/A 12/29/2014   Procedure: COLONOSCOPY WITH PROPOFOL;  Surgeon: Lollie Sails, MD;  Location[facility] ENDOSCOPY;  Service: Endoscopy;  Laterality: N/A;   COLONOSCOPY WITH PROPOFOL N/A 09/04/2017   Procedure: COLONOSCOPY WITH PROPOFOL;  Surgeon: Lollie Sails, MD;   Location[facility] ENDOSCOPY;  Service: Endoscopy;  Laterality: N/A;   COLONOSCOPY WITH PROPOFOL N/A 01/28/2020   Procedure: COLONOSCOPY WITH PROPOFOL;  Surgeon: Lesly Rubenstein, MD;  Location: [facility] ENDOSCOPY;  Service: Endoscopy;  Laterality: N/A;   COLONOSCOPY WITH PROPOFOL N/A 06/07/2021   Procedure: COLONOSCOPY WITH PROPOFOL;  Surgeon: Lesly Rubenstein, MD;  Location: [facility] ENDOSCOPY;  Service: Endoscopy;  Laterality: N/A;  DM   CYSTOSCOPY W/ RETROGRADES Bilateral 03/14/2019   Procedure: CYSTOSCOPY WITH RETROGRADE PYELOGRAM;  Surgeon: Hollice Espy, MD;  Location: [facility] ORS;  Service: Urology;  Laterality: Bilateral;   CYSTOSCOPY WITH BIOPSY N/A 03/14/2019   Procedure: CYSTOSCOPY WITH bladder BIOPSY;  Surgeon: Hollice Espy, MD;  Location: [facility] ORS;  Service: Urology;  Laterality: N/A;   EYE SURGERY Left    MACULAR HOLE   EYE SURGERY Right    TORN RETINA   FRACTURE SURGERY Left    ARM   IR FLUORO GUIDE PORT INSERTION RIGHT  09/01/2016   IR REMOVAL TUN ACCESS W/ PORT W/O FL MOD SED  03/28/2018   LITHOTRIPSY     PROSTATECTOMY  1998   TONSILLECTOMY      Social History   Socioeconomic History   Marital status: Married    Spouse name: Marcie Bal    Number of children: 3   Years of education: Not on file   Highest education  level: Not on file  Occupational History   Occupation: retired     Comment: Tax adviser  Tobacco Use   Smoking status: Former    Packs/day: 1.00    Years: 8.00    Total pack years: 8.00    Types: Cigarettes    Quit date: 08/13/1974    Years since quitting: 47.3   Smokeless tobacco: Former    Types: Snuff  Vaping Use   Vaping Use: Never used  Substance and Sexual Activity   Alcohol use: Yes    Comment: rarely-one or two beer   Drug use: No   Sexual activity: Not Currently  Other Topics Concern   Not on file  Social History Narrative   Not on file   Social Determinants of Health   Financial Resource Strain: Low Risk  (03/28/2018)   Overall  Financial Resource Strain (CARDIA)    Difficulty of Paying Living Expenses: Not very hard  Food Insecurity: No Food Insecurity (03/28/2018)   Hunger Vital Sign    Worried About Running Out of Food in the Last Year: Never true    Ran Out of Food in the Last Year: Never true  Transportation Needs: No Transportation Needs (03/28/2018)   PRAPARE - Hydrologist (Medical): No    Lack of Transportation (Non-Medical): No  Physical Activity: Insufficiently Active (03/28/2018)   Exercise Vital Sign    Days of Exercise per Week: 1 day    Minutes of Exercise per Session: 90 min  Stress: No Stress Concern Present (03/28/2018)   [place]    Feeling of Stress : Only a little  Social Connections: Unknown (03/28/2018)   Social Connection and Isolation Panel [NHANES]    Frequency of Communication with Friends and Family: More than three times a week    Frequency of Social Gatherings with Friends and Family: Not on file    Attends Religious Services: More than 4 times per year    Active Member of [employer] or Organizations: Not on file    Attends Archivist Meetings: Not on file    Marital Status: Married  Intimate Partner Violence: Not At Risk (03/28/2018)   Humiliation, Afraid, Rape, and Kick questionnaire    Fear of Current or Ex-Partner: No    Emotionally Abused: No    Physically Abused: No    Sexually Abused: No    Family History  Problem Relation Age of Onset   Coronary artery disease Mother    Diabetes Mother    Prostate cancer Father    Pancreatitis Father    Bladder Cancer Sister    Diabetes Brother    Diabetes Brother    Prostate cancer Brother    Diabetes Brother    Diabetes Maternal Grandmother      Current Outpatient Medications:    aspirin 81 MG chewable tablet, , Disp: , Rfl:    atorvastatin (LIPITOR) 80 MG tablet, Take 80 mg by mouth daily., Disp: , Rfl:    Blood Glucose  Monitoring Suppl (TRUE METRIX AIR GLUCOSE METER) w/Device KIT, , Disp: , Rfl:    hydrochlorothiazide (HYDRODIURIL) 25 MG tablet, Take 25 mg by mouth daily., Disp: , Rfl:    lisinopril (PRINIVIL,ZESTRIL) 10 MG tablet, Take 10 mg by mouth daily. , Disp: , Rfl:    magnesium oxide (MAG-OX) 400 MG tablet, Take by mouth., Disp: , Rfl:    metFORMIN (GLUCOPHAGE-XR) 750 MG 24 hr tablet, Take 750 mg  by mouth 2 (two) times daily., Disp: , Rfl:    Multiple Vitamin (MULTIVITAMIN WITH MINERALS) TABS tablet, Take 1 tablet by mouth daily., Disp: , Rfl:    Polyethyl Glycol-Propyl Glycol (LUBRICANT EYE DROPS) 0.4-0.3 % SOLN, Place 1 drop into both eyes 3 (three) times daily as needed (dry/irritated eyes.)., Disp: , Rfl:    TRUE METRIX BLOOD GLUCOSE TEST test strip, , Disp: , Rfl:    vitamin B-12 (CYANOCOBALAMIN) 1000 MCG tablet, Take 1,000 mcg by mouth daily., Disp: , Rfl:    ACCU-CHEK FASTCLIX LANCETS MISC, , Disp: , Rfl:    empagliflozin (JARDIANCE) 10 MG TABS tablet, Take 1 tablet by mouth daily. (Patient not taking: Reported on 12/21/2021), Disp: , Rfl:   Physical exam:  Vitals:   12/21/21 1034  BP: (!) 112/56  Pulse: 65  Resp: 16  Temp: 98.1 F (36.7 C)  SpO2: 100%  Weight: 183 lb 14.4 oz (83.4 kg)   Physical Exam Constitutional:      General: He is not in acute distress. Cardiovascular:     Rate and Rhythm: Normal rate and regular rhythm.     Heart sounds: Normal heart sounds.  Pulmonary:     Effort: Pulmonary effort is normal.     Breath sounds: Normal breath sounds.  Abdominal:     General: Bowel sounds are normal.     Palpations: Abdomen is soft.  Lymphadenopathy:     Comments: No palpable cervical, supraclavicular, axillary or inguinal adenopathy    Skin:    General: Skin is warm and dry.  Neurological:     Mental Status: He is alert and oriented to person, place, and time.         Latest Ref Rng & Units 12/21/2021   10:01 AM  CMP  Glucose 70 - 99 mg/dL 168   BUN 8 - 23  mg/dL 31   Creatinine 0.61 - 1.24 mg/dL 1.21   Sodium 135 - 145 mmol/L 138   Potassium 3.5 - 5.1 mmol/L 4.0   Chloride 98 - 111 mmol/L 102   CO2 22 - 32 mmol/L 28   Calcium 8.9 - 10.3 mg/dL 9.1   Total Protein 6.5 - 8.1 g/dL 7.4   Total Bilirubin 0.3 - 1.2 mg/dL 0.7   Alkaline Phos 38 - 126 U/L 84   AST 15 - 41 U/L 23   ALT 0 - 44 U/L 27       Latest Ref Rng & Units 12/21/2021   10:01 AM  CBC  WBC 4.0 - 10.5 K/uL 8.9   Hemoglobin 13.0 - 17.0 g/dL 12.8   Hematocrit 39.0 - 52.0 % 39.2   Platelets 150 - 400 K/uL 262     Assessment and plan- Patient is a 81 y.o. male who is here for follow-up of following issues  History of DLBCL: S/p 6 cycles of R-CHOP chemotherapy in October 2018.  Clinically patient is doing well with no concerning signs and symptoms of recurrence based on today's exam.  Since he is now 5 years out of his diagnosis he does not require any heme-onc follow-up at this time.  No indication for surveillance imaging at this time unless there are any B symptoms or palpable adenopathy  History of prostate cancer: PSA is slowly going up and his last value was 0.06.  PSA from today is pending.  As of now he does not meet criteria for biochemical recurrence since his absolute value for PSA is not more than 0.2.  He has  an upcoming appointment with urology this week.  I will defer any further management of prostate cancer and need to restart ADT to urology at this time.   Visit Diagnosis 1. Encounter for follow-up surveillance of diffuse large B-cell lymphoma   2. Prostate cancer [facility])      Dr. Randa Evens, MD, MPH [facility] [phone] 12/21/2021 1:59 PM

## 2021-12-23 ENCOUNTER — Encounter: Payer: Self-pay | Admitting: Physician Assistant

## 2021-12-23 ENCOUNTER — Ambulatory Visit: Payer: Medicare HMO | Admitting: Physician Assistant

## 2021-12-23 VITALS — BP 147/74 | HR 74 | Ht 72.0 in | Wt 182.0 lb

## 2021-12-23 DIAGNOSIS — R310 Gross hematuria: Secondary | ICD-10-CM | POA: Diagnosis not present

## 2021-12-23 DIAGNOSIS — Z8546 Personal history of malignant neoplasm of prostate: Secondary | ICD-10-CM | POA: Diagnosis not present

## 2021-12-23 LAB — URINALYSIS, COMPLETE
Bilirubin, UA: NEGATIVE
Glucose, UA: NEGATIVE
Ketones, UA: NEGATIVE
Leukocytes,UA: NEGATIVE
Nitrite, UA: NEGATIVE
Protein,UA: NEGATIVE
RBC, UA: NEGATIVE
Specific Gravity, UA: 1.02 (ref 1.005–1.030)
Urobilinogen, Ur: 0.2 mg/dL (ref 0.2–1.0)
pH, UA: 5 (ref 5.0–7.5)

## 2021-12-23 LAB — MICROSCOPIC EXAMINATION

## 2021-12-23 NOTE — Progress Notes (Signed)
12/23/2021 1:57 PM   Christian Kelly 1940-09-02 [id number]  CC: Chief Complaint  Patient presents with   Prostate Cancer   Hematuria   HPI: Christian Kelly is a 81 y.o. male with PMH prostate cancer s/p prostatectomy in 1999 with biochemical recurrence in 2018 s/p salvage radiation, gross hematuria with radiation cystitis, and diffuse B-cell lymphoma who presents today for annual follow-up.  He is accompanied today by his wife, who contributes to HPI.   Today he reports no dysuria, flank pain, gross hematuria, low back pain, or unintentional weight loss over the past year.  He was released from care by Dr. Janese Banks earlier this week for his diffuse B-cell lymphoma with no evidence of recurrence since his diagnosis 5 years ago.  His PSA has risen slightly over the past year, most recently 0.15 2 days ago.  He reports an extensive family history of prostate cancer and is concerned about further recurrences.  In-office UA and microscopy today pan negative.  PMH: Past Medical History:  Diagnosis Date   Arthritis    Basal cell carcinoma 2008   forehead   Chronic renal insufficiency, stage III (moderate) (HCC)    Colon cancer ([place]) 1998   Diabetes mellitus without complication ([place])    Controlled with diet only as of 11/01/2016   Diffuse large B cell lymphoma (HCC)    H/O onychomycosis    History of kidney stones    Hyperlipemia    Hypertension    Lyme disease    Prostate cancer ([place]) 1999    Surgical History: Past Surgical History:  Procedure Laterality Date   COLON SURGERY  1998   COLONOSCOPY     COLONOSCOPY WITH PROPOFOL N/A 12/29/2014   Procedure: COLONOSCOPY WITH PROPOFOL;  Surgeon: Lollie Sails, MD;  Location: [facility] ENDOSCOPY;  Service: Endoscopy;  Laterality: N/A;   COLONOSCOPY WITH PROPOFOL N/A 09/04/2017   Procedure: COLONOSCOPY WITH PROPOFOL;  Surgeon: Lollie Sails, MD;  Location: [facility] ENDOSCOPY;  Service: Endoscopy;  Laterality: N/A;   COLONOSCOPY WITH PROPOFOL  N/A 01/28/2020   Procedure: COLONOSCOPY WITH PROPOFOL;  Surgeon: Lesly Rubenstein, MD;  Location: [facility] ENDOSCOPY;  Service: Endoscopy;  Laterality: N/A;   COLONOSCOPY WITH PROPOFOL N/A 06/07/2021   Procedure: COLONOSCOPY WITH PROPOFOL;  Surgeon: Lesly Rubenstein, MD;  Location: [facility] ENDOSCOPY;  Service: Endoscopy;  Laterality: N/A;  DM   CYSTOSCOPY W/ RETROGRADES Bilateral 03/14/2019   Procedure: CYSTOSCOPY WITH RETROGRADE PYELOGRAM;  Surgeon: Hollice Espy, MD;  Location: [facility] ORS;  Service: Urology;  Laterality: Bilateral;   CYSTOSCOPY WITH BIOPSY N/A 03/14/2019   Procedure: CYSTOSCOPY WITH bladder BIOPSY;  Surgeon: Hollice Espy, MD;  Location: [facility] ORS;  Service: Urology;  Laterality: N/A;   EYE SURGERY Left    MACULAR HOLE   EYE SURGERY Right    TORN RETINA   FRACTURE SURGERY Left    ARM   IR FLUORO GUIDE PORT INSERTION RIGHT  09/01/2016   IR REMOVAL TUN ACCESS W/ PORT W/O FL MOD SED  03/28/2018   LITHOTRIPSY     PROSTATECTOMY  1998   TONSILLECTOMY      Home Medications:  Allergies as of 12/23/2021       Reactions   Tape Rash        Medication List        Accurate as of December 23, 2021  1:57 PM. If you have any questions, ask your nurse or doctor.          STOP taking these medications  empagliflozin 10 MG Tabs tablet Commonly known as: JARDIANCE Stopped by: Debroah Loop, PA-C       TAKE these medications    Accu-Chek FastClix Lancets Misc   aspirin 81 MG chewable tablet   atorvastatin 80 MG tablet Commonly known as: LIPITOR Take 80 mg by mouth daily.   cyanocobalamin 1000 MCG tablet Commonly known as: VITAMIN B12 Take 1,000 mcg by mouth daily.   hydrochlorothiazide 25 MG tablet Commonly known as: HYDRODIURIL Take 25 mg by mouth daily.   lisinopril 10 MG tablet Commonly known as: ZESTRIL Take 10 mg by mouth daily.   Lubricant Eye Drops 0.4-0.3 % Soln Generic drug: Polyethyl Glycol-Propyl Glycol Place 1 drop into both eyes 3  (three) times daily as needed (dry/irritated eyes.).   magnesium oxide 400 MG tablet Commonly known as: MAG-OX Take by mouth.   metFORMIN 750 MG 24 hr tablet Commonly known as: GLUCOPHAGE-XR Take 750 mg by mouth 2 (two) times daily.   multivitamin with minerals Tabs tablet Take 1 tablet by mouth daily.   True Metrix Air Glucose Meter w/Device Kit   True Metrix Blood Glucose Test test strip Generic drug: glucose blood        Allergies:  Allergies  Allergen Reactions   Tape Rash    Family History: Family History  Problem Relation Age of Onset   Coronary artery disease Mother    Diabetes Mother    Prostate cancer Father    Pancreatitis Father    Bladder Cancer Sister    Diabetes Brother    Diabetes Brother    Prostate cancer Brother    Diabetes Brother    Diabetes Maternal Grandmother     Social History:   reports that he quit smoking about 47 years ago. His smoking use included cigarettes. He has a 8.00 pack-year smoking history. He has quit using smokeless tobacco.  His smokeless tobacco use included snuff. He reports current alcohol use. He reports that he does not use drugs.  Physical Exam: BP (!) 147/74   Pulse 74   Ht 6' (1.829 m)   Wt 182 lb (82.6 kg)   BMI 24.68 kg/m   Constitutional:  Alert and oriented, no acute distress, nontoxic appearing HEENT: [place], AT Cardiovascular: No clubbing, cyanosis, or edema Respiratory: Normal respiratory effort, no increased work of breathing Skin: No rashes, bruises or suspicious lesions Neurologic: Grossly intact, no focal deficits, moving all 4 extremities Psychiatric: Normal mood and affect  Laboratory Data: Results for orders placed or performed in visit on 12/23/21  Microscopic Examination   Urine  Result Value Ref Range   WBC, UA 0-5 0 - 5 /hpf   RBC, Urine 0-2 0 - 2 /hpf   Epithelial Cells (non renal) 0-10 0 - 10 /hpf   Bacteria, UA Few None seen/Few  Urinalysis, Complete  Result Value Ref Range    Specific Gravity, UA 1.020 1.005 - 1.030   pH, UA 5.0 5.0 - 7.5   Color, UA Yellow Yellow   Appearance Ur Clear Clear   Leukocytes,UA Negative Negative   Protein,UA Negative Negative/Trace   Glucose, UA Negative Negative   Ketones, UA Negative Negative   RBC, UA Negative Negative   Bilirubin, UA Negative Negative   Urobilinogen, Ur 0.2 0.2 - 1.0 mg/dL   Nitrite, UA Negative Negative   Microscopic Examination See below:    Assessment & Plan:   1. History of prostate cancer PSA rising slightly over the past year, though he does not yet meet the  definition for biochemical recurrence.  We discussed repeating his PSA and he prefers to do this relatively sooner. We will plan for repeat PSA in 3 months and schedule him for prostate cancer follow-up with Dr. Erlene Quan in 6 months with another PSA immediately prior.  We discussed that we can have him come in to see Dr. Erlene Quan sooner if his PSA rises above the threshold of 0.2 in the interim.  He is in agreement with this plan.  2. Gross hematuria No further episodes of gross hematuria in the past year.  His UA is bland today.  We will continue to monitor. - Urinalysis, Complete  Return in about 3 months (around 03/25/2022), or PSA lab only 3  months and 6 month FU with Dr Erlene Quan PSA again prior.  Debroah Loop, PA-C  [facility] [address] [place], [place] [address] [phone]

## 2021-12-27 ENCOUNTER — Telehealth: Payer: Self-pay | Admitting: *Deleted

## 2021-12-27 DIAGNOSIS — Z1329 Encounter for screening for other suspected endocrine disorder: Secondary | ICD-10-CM | POA: Diagnosis not present

## 2021-12-27 DIAGNOSIS — E1122 Type 2 diabetes mellitus with diabetic chronic kidney disease: Secondary | ICD-10-CM | POA: Diagnosis not present

## 2021-12-27 DIAGNOSIS — N3040 Irradiation cystitis without hematuria: Secondary | ICD-10-CM | POA: Diagnosis not present

## 2021-12-27 DIAGNOSIS — Z0000 Encounter for general adult medical examination without abnormal findings: Secondary | ICD-10-CM | POA: Diagnosis not present

## 2021-12-27 DIAGNOSIS — Z23 Encounter for immunization: Secondary | ICD-10-CM | POA: Diagnosis not present

## 2021-12-27 DIAGNOSIS — N182 Chronic kidney disease, stage 2 (mild): Secondary | ICD-10-CM | POA: Diagnosis not present

## 2021-12-27 DIAGNOSIS — Z86010 Personal history of colonic polyps: Secondary | ICD-10-CM | POA: Diagnosis not present

## 2021-12-27 DIAGNOSIS — Z1331 Encounter for screening for depression: Secondary | ICD-10-CM | POA: Diagnosis not present

## 2021-12-27 DIAGNOSIS — E1169 Type 2 diabetes mellitus with other specified complication: Secondary | ICD-10-CM | POA: Diagnosis not present

## 2021-12-27 DIAGNOSIS — C8333 Diffuse large B-cell lymphoma, intra-abdominal lymph nodes: Secondary | ICD-10-CM | POA: Diagnosis not present

## 2021-12-27 DIAGNOSIS — Z85038 Personal history of other malignant neoplasm of large intestine: Secondary | ICD-10-CM | POA: Diagnosis not present

## 2021-12-27 DIAGNOSIS — I700 Atherosclerosis of aorta: Secondary | ICD-10-CM | POA: Diagnosis not present

## 2021-12-27 DIAGNOSIS — E538 Deficiency of other specified B group vitamins: Secondary | ICD-10-CM | POA: Diagnosis not present

## 2021-12-27 DIAGNOSIS — E785 Hyperlipidemia, unspecified: Secondary | ICD-10-CM | POA: Diagnosis not present

## 2021-12-27 DIAGNOSIS — C61 Malignant neoplasm of prostate: Secondary | ICD-10-CM | POA: Diagnosis not present

## 2021-12-27 NOTE — Patient Outreach (Signed)
  Care Coordination   Initial Visit Note   12/27/2021 Name: Esai Stecklein MRN: [id number] DOB: 02-15-1941  Alastair Hennes is a 81 y.o. year old male who sees Ezequiel Kayser, MD for primary care. I spoke with  [name] by phone today.  What matters to the patients health and wellness today?  I could use some help getting my A1C down    Goals Addressed             This Visit's Progress    Develop a Plan of Care to decrease A1C from 7.9          SDOH assessments and interventions completed:  Yes  SDOH Interventions Today    Flowsheet Row Most Recent Value  SDOH Interventions   Food Insecurity Interventions Intervention Not Indicated  Housing Interventions Intervention Not Indicated  Transportation Interventions Intervention Not Indicated        Care Coordination Interventions Activated:  Yes  Care Coordination Interventions:  Yes, provided   Follow up plan: Follow up call scheduled for [facility] 01/03/2022 10AM    Encounter Outcome:  Pt. Visit Completed   [place] [facility] [phone]

## 2021-12-28 DIAGNOSIS — H401131 Primary open-angle glaucoma, bilateral, mild stage: Secondary | ICD-10-CM | POA: Diagnosis not present

## 2021-12-28 DIAGNOSIS — H35342 Macular cyst, hole, or pseudohole, left eye: Secondary | ICD-10-CM | POA: Diagnosis not present

## 2021-12-28 DIAGNOSIS — H26493 Other secondary cataract, bilateral: Secondary | ICD-10-CM | POA: Diagnosis not present

## 2021-12-28 DIAGNOSIS — E119 Type 2 diabetes mellitus without complications: Secondary | ICD-10-CM | POA: Diagnosis not present

## 2021-12-29 DIAGNOSIS — Z86018 Personal history of other benign neoplasm: Secondary | ICD-10-CM | POA: Diagnosis not present

## 2021-12-29 DIAGNOSIS — L578 Other skin changes due to chronic exposure to nonionizing radiation: Secondary | ICD-10-CM | POA: Diagnosis not present

## 2021-12-29 DIAGNOSIS — L570 Actinic keratosis: Secondary | ICD-10-CM | POA: Diagnosis not present

## 2021-12-29 DIAGNOSIS — Z872 Personal history of diseases of the skin and subcutaneous tissue: Secondary | ICD-10-CM | POA: Diagnosis not present

## 2021-12-29 DIAGNOSIS — Z85828 Personal history of other malignant neoplasm of skin: Secondary | ICD-10-CM | POA: Diagnosis not present

## 2022-01-03 ENCOUNTER — Ambulatory Visit: Payer: Self-pay | Admitting: *Deleted

## 2022-01-03 DIAGNOSIS — E1142 Type 2 diabetes mellitus with diabetic polyneuropathy: Secondary | ICD-10-CM | POA: Diagnosis not present

## 2022-01-03 DIAGNOSIS — D2371 Other benign neoplasm of skin of right lower limb, including hip: Secondary | ICD-10-CM | POA: Diagnosis not present

## 2022-01-03 DIAGNOSIS — M79671 Pain in right foot: Secondary | ICD-10-CM | POA: Diagnosis not present

## 2022-01-03 NOTE — Patient Outreach (Signed)
  Care Coordination   Follow Up Visit Note   01/03/2022 Name: Zamarian Scarano MRN: [id number] DOB: [birth year]  Tavi Hoogendoorn is a 81 y.o. year old male who sees Ezequiel Kayser, MD for primary care. I spoke with wife of  Cormick Moss by phone today.  What matters to the patients health and wellness today?  Wife report member is not available today but she questions about how [facility] received member's information and were given permission to contact.  State she asked member's PCP about program and she was not aware.  Program explained, advised this was communicated with provider office, apologies expressed for any miscommunication.  Wife agrees to have member contacted to reschedule if he desires to proceed.    SDOH assessments and interventions completed:  No     Care Coordination Interventions Activated:  No  Care Coordination Interventions:  No, not indicated   Follow up plan:  Will have care guide call back to reschedule    Encounter Outcome:  Pt. Request to Call Lyons, RN, MSN, [place] [facility] Care Management Coordinator [phone]

## 2022-01-18 DIAGNOSIS — D2371 Other benign neoplasm of skin of right lower limb, including hip: Secondary | ICD-10-CM | POA: Diagnosis not present

## 2022-01-18 DIAGNOSIS — E1142 Type 2 diabetes mellitus with diabetic polyneuropathy: Secondary | ICD-10-CM | POA: Diagnosis not present

## 2022-01-19 ENCOUNTER — Telehealth: Payer: Self-pay | Admitting: *Deleted

## 2022-01-19 NOTE — Progress Notes (Signed)
  Care Coordination Note  01/19/2022 Name: Christian Kelly MRN: [id number] DOB: 05/09/1940  Christian Kelly is a 81 y.o. year old male who is a primary care patient of Ezequiel Kayser, MD and is actively engaged with the care management team. I reached out to [name] by phone today to assist with re-scheduling a follow up visit with the RN Case Manager  Follow up plan: Unsuccessful telephone outreach attempt made. A HIPAA compliant phone message was left for the patient providing contact information and requesting a return call.   Julian Hy, [place] Direct Dial: [phone]

## 2022-02-25 NOTE — Progress Notes (Signed)
  Care Coordination Note  02/25/2022 Name: Christian Kelly MRN: [id number] DOB: March 07, 1940  Christian Kelly is a 82 y.o. year old male who is a primary care patient of Ezequiel Kayser, MD and is actively engaged with the care management team. I reached out to [name] by phone today to assist with re-scheduling a follow up visit with the RN Case Manager  Follow up plan: We have been unable to make contact with the patient for follow up.   Julian Hy, [place] Direct Dial: [phone]

## 2022-03-24 ENCOUNTER — Other Ambulatory Visit: Payer: Self-pay

## 2022-03-24 ENCOUNTER — Other Ambulatory Visit: Payer: Medicare HMO

## 2022-03-24 DIAGNOSIS — Z8546 Personal history of malignant neoplasm of prostate: Secondary | ICD-10-CM

## 2022-03-24 DIAGNOSIS — C61 Malignant neoplasm of prostate: Secondary | ICD-10-CM | POA: Diagnosis not present

## 2022-03-25 LAB — PSA: Prostate Specific Ag, Serum: 0.2 ng/mL (ref 0.0–4.0)

## 2022-04-04 DIAGNOSIS — E1122 Type 2 diabetes mellitus with diabetic chronic kidney disease: Secondary | ICD-10-CM | POA: Diagnosis not present

## 2022-04-04 DIAGNOSIS — Z8546 Personal history of malignant neoplasm of prostate: Secondary | ICD-10-CM | POA: Diagnosis not present

## 2022-04-04 DIAGNOSIS — R319 Hematuria, unspecified: Secondary | ICD-10-CM | POA: Diagnosis not present

## 2022-04-04 DIAGNOSIS — N182 Chronic kidney disease, stage 2 (mild): Secondary | ICD-10-CM | POA: Diagnosis not present

## 2022-04-21 DIAGNOSIS — R319 Hematuria, unspecified: Secondary | ICD-10-CM | POA: Diagnosis not present

## 2022-06-17 DIAGNOSIS — E1159 Type 2 diabetes mellitus with other circulatory complications: Secondary | ICD-10-CM | POA: Diagnosis not present

## 2022-06-17 DIAGNOSIS — E538 Deficiency of other specified B group vitamins: Secondary | ICD-10-CM | POA: Diagnosis not present

## 2022-06-17 DIAGNOSIS — N182 Chronic kidney disease, stage 2 (mild): Secondary | ICD-10-CM | POA: Diagnosis not present

## 2022-06-17 DIAGNOSIS — E1169 Type 2 diabetes mellitus with other specified complication: Secondary | ICD-10-CM | POA: Diagnosis not present

## 2022-06-17 DIAGNOSIS — E785 Hyperlipidemia, unspecified: Secondary | ICD-10-CM | POA: Diagnosis not present

## 2022-06-17 DIAGNOSIS — I700 Atherosclerosis of aorta: Secondary | ICD-10-CM | POA: Diagnosis not present

## 2022-06-17 DIAGNOSIS — Z09 Encounter for follow-up examination after completed treatment for conditions other than malignant neoplasm: Secondary | ICD-10-CM | POA: Diagnosis not present

## 2022-06-17 DIAGNOSIS — I152 Hypertension secondary to endocrine disorders: Secondary | ICD-10-CM | POA: Diagnosis not present

## 2022-06-17 DIAGNOSIS — Z23 Encounter for immunization: Secondary | ICD-10-CM | POA: Diagnosis not present

## 2022-06-17 DIAGNOSIS — E1122 Type 2 diabetes mellitus with diabetic chronic kidney disease: Secondary | ICD-10-CM | POA: Diagnosis not present

## 2022-06-20 ENCOUNTER — Other Ambulatory Visit: Payer: Self-pay

## 2022-06-20 DIAGNOSIS — Z8546 Personal history of malignant neoplasm of prostate: Secondary | ICD-10-CM

## 2022-06-21 ENCOUNTER — Other Ambulatory Visit: Payer: Medicare HMO

## 2022-06-21 DIAGNOSIS — Z8546 Personal history of malignant neoplasm of prostate: Secondary | ICD-10-CM | POA: Diagnosis not present

## 2022-06-21 DIAGNOSIS — C61 Malignant neoplasm of prostate: Secondary | ICD-10-CM | POA: Diagnosis not present

## 2022-06-21 DIAGNOSIS — R972 Elevated prostate specific antigen [PSA]: Secondary | ICD-10-CM | POA: Diagnosis not present

## 2022-06-22 ENCOUNTER — Ambulatory Visit: Payer: Medicare HMO | Admitting: Urology

## 2022-06-22 LAB — PSA: Prostate Specific Ag, Serum: 0.2 ng/mL (ref 0.0–4.0)

## 2022-06-28 ENCOUNTER — Ambulatory Visit: Payer: Medicare HMO | Admitting: Urology

## 2022-06-28 VITALS — BP 136/64 | HR 76 | Ht 72.0 in | Wt 196.0 lb

## 2022-06-28 DIAGNOSIS — R3129 Other microscopic hematuria: Secondary | ICD-10-CM

## 2022-06-28 DIAGNOSIS — E119 Type 2 diabetes mellitus without complications: Secondary | ICD-10-CM | POA: Diagnosis not present

## 2022-06-28 DIAGNOSIS — H26493 Other secondary cataract, bilateral: Secondary | ICD-10-CM | POA: Diagnosis not present

## 2022-06-28 DIAGNOSIS — H401131 Primary open-angle glaucoma, bilateral, mild stage: Secondary | ICD-10-CM | POA: Diagnosis not present

## 2022-06-28 DIAGNOSIS — H35342 Macular cyst, hole, or pseudohole, left eye: Secondary | ICD-10-CM | POA: Diagnosis not present

## 2022-06-28 DIAGNOSIS — R310 Gross hematuria: Secondary | ICD-10-CM | POA: Diagnosis not present

## 2022-06-28 DIAGNOSIS — Z8546 Personal history of malignant neoplasm of prostate: Secondary | ICD-10-CM | POA: Diagnosis not present

## 2022-06-28 DIAGNOSIS — Z87448 Personal history of other diseases of urinary system: Secondary | ICD-10-CM | POA: Diagnosis not present

## 2022-06-28 LAB — URINALYSIS, COMPLETE
Bilirubin, UA: NEGATIVE
Ketones, UA: NEGATIVE
Leukocytes,UA: NEGATIVE
Nitrite, UA: NEGATIVE
Protein,UA: NEGATIVE
RBC, UA: NEGATIVE
Specific Gravity, UA: 1.02 (ref 1.005–1.030)
Urobilinogen, Ur: 0.2 mg/dL (ref 0.2–1.0)
pH, UA: 5 (ref 5.0–7.5)

## 2022-06-28 LAB — MICROSCOPIC EXAMINATION: Bacteria, UA: NONE SEEN

## 2022-06-28 NOTE — Progress Notes (Signed)
I, Amy L Pierron, acting as a scribe for Christian Scotland, MD.,have documented all relevant documentation on the behalf of Christian Scotland, MD,as directed by  Christian Scotland, MD while in the presence of Christian Scotland, MD.  06/28/2022 4:23 PM   Justin Mend 08-31-1940 [id number]  Referring provider: Mickey Farber, MD [address] [place],  [place] [address]  Chief Complaint  Patient presents with   history prostate cancer    Follow up    HPI: 82 year-old male with a personal history of prostate cancer returns today for a six month follow-up.  He is status post radical prostatectomy in 1999. He had biochemical recurrence with salvage radiation in 2018, complicated by radiation cystitis. In the more recent past his PSA has risen slightly, not yet reaching the definition of biochemical recurrence.   PSA Trend 10/18/2019    undetectable 05/14/2020    0.01 01/04/2021  0.03 06/16/2021    0.06 12/21/2021  0.15 03/24/2022      0.2 06/21/2022    0.2  He is doing well overall with no urinary symptoms. He reports having a bladder infection in February and the PCP prescribed anti-biotics for 14 days which cleared up his hematuria and symptoms. He mentions he had hematuria back in November of 2023 but cleared up on its own. He is curious if increased physical activities, such as chopping wood, may have caused this. He asked about his BUN and creatinine readings. He knows he needs to drink more water.    PMH: Past Medical History:  Diagnosis Date   Arthritis    Basal cell carcinoma 2008   forehead   Chronic renal insufficiency, stage III (moderate) (HCC)    Colon cancer (HCC) 1998   Diabetes mellitus without complication (HCC)    Controlled with diet only as of 11/01/2016   Diffuse large B cell lymphoma (HCC)    H/O onychomycosis    History of kidney stones    Hyperlipemia    Hypertension    Lyme disease    Prostate cancer (HCC) 1999    Surgical History: Past Surgical History:   Procedure Laterality Date   COLON SURGERY  1998   COLONOSCOPY     COLONOSCOPY WITH PROPOFOL N/A 12/29/2014   Procedure: COLONOSCOPY WITH PROPOFOL;  Surgeon: Christena Deem, MD;  Location: [facility] ENDOSCOPY;  Service: Endoscopy;  Laterality: N/A;   COLONOSCOPY WITH PROPOFOL N/A 09/04/2017   Procedure: COLONOSCOPY WITH PROPOFOL;  Surgeon: Christena Deem, MD;  Location: [facility] ENDOSCOPY;  Service: Endoscopy;  Laterality: N/A;   COLONOSCOPY WITH PROPOFOL N/A 01/28/2020   Procedure: COLONOSCOPY WITH PROPOFOL;  Surgeon: Regis Bill, MD;  Location: [facility] ENDOSCOPY;  Service: Endoscopy;  Laterality: N/A;   COLONOSCOPY WITH PROPOFOL N/A 06/07/2021   Procedure: COLONOSCOPY WITH PROPOFOL;  Surgeon: Regis Bill, MD;  Location: [facility] ENDOSCOPY;  Service: Endoscopy;  Laterality: N/A;  DM   CYSTOSCOPY W/ RETROGRADES Bilateral 03/14/2019   Procedure: CYSTOSCOPY WITH RETROGRADE PYELOGRAM;  Surgeon: Christian Scotland, MD;  Location: [facility] ORS;  Service: Urology;  Laterality: Bilateral;   CYSTOSCOPY WITH BIOPSY N/A 03/14/2019   Procedure: CYSTOSCOPY WITH bladder BIOPSY;  Surgeon: Christian Scotland, MD;  Location: [facility] ORS;  Service: Urology;  Laterality: N/A;   EYE SURGERY Left    MACULAR HOLE   EYE SURGERY Right    TORN RETINA   FRACTURE SURGERY Left    ARM   IR FLUORO GUIDE PORT INSERTION RIGHT  09/01/2016   IR REMOVAL TUN ACCESS W/  PORT W/O FL MOD SED  03/28/2018   LITHOTRIPSY     PROSTATECTOMY  1998   TONSILLECTOMY      Home Medications:  Allergies as of 06/28/2022       Reactions   Tape Rash        Medication List        Accurate as of Jun 28, 2022  4:23 PM. If you have any questions, ask your nurse or doctor.          Accu-Chek FastClix Lancets Misc   aspirin 81 MG chewable tablet   atorvastatin 80 MG tablet Commonly known as: LIPITOR Take 80 mg by mouth daily.   cyanocobalamin 1000 MCG tablet Commonly known as: VITAMIN B12 Take 1,000 mcg by mouth daily.    hydrochlorothiazide 25 MG tablet Commonly known as: HYDRODIURIL Take 25 mg by mouth daily.   lisinopril 10 MG tablet Commonly known as: ZESTRIL Take 10 mg by mouth daily.   Lubricant Eye Drops 0.4-0.3 % Soln Generic drug: Polyethyl Glycol-Propyl Glycol Place 1 drop into both eyes 3 (three) times daily as needed (dry/irritated eyes.).   metFORMIN 750 MG 24 hr tablet Commonly known as: GLUCOPHAGE-XR Take 750 mg by mouth 2 (two) times daily.   multivitamin with minerals Tabs tablet Take 1 tablet by mouth daily.   True Metrix Air Glucose Meter w/Device Kit   True Metrix Blood Glucose Test test strip Generic drug: glucose blood        Allergies:  Allergies  Allergen Reactions   Tape Rash    Family History: Family History  Problem Relation Age of Onset   Coronary artery disease Mother    Diabetes Mother    Prostate cancer Father    Pancreatitis Father    Bladder Cancer Sister    Diabetes Brother    Diabetes Brother    Prostate cancer Brother    Diabetes Brother    Diabetes Maternal Grandmother     Social History:  reports that he quit smoking about 47 years ago. His smoking use included cigarettes. He has a 8.00 pack-year smoking history. He has quit using smokeless tobacco.  His smokeless tobacco use included snuff. He reports current alcohol use. He reports that he does not use drugs.   Physical Exam: BP 136/64   Pulse 76   Ht 6' (1.829 m)   Wt 196 lb (88.9 kg)   BMI 26.58 kg/m   Constitutional:  Alert and oriented, No acute distress. HEENT:  AT, moist mucus membranes.  Trachea midline, no masses. Neurologic: Grossly intact, no focal deficits, moving all 4 extremities. Psychiatric: Normal mood and affect.  Urinalysis Results pending   Assessment & Plan:    History of prostate cancer  -  Now meets definition of biochemical occurrence for the second time because his PSA is very slowly rising. He's completely asymptomatic. It's unlikely that a PET  scan would show any pathology at this point in time because of the very low PSA.   - We discuss options, including initiation of ADT versus a watch and waiting approach. I would favor a watch-waiting approach and he's agreeable to this plan. Repeat PSA in six months, and he'll see me again in a year with a PSA.   2. History of gross microscopic hematuria  - He had a urinary tract infection which was treated appropriately in February by his PCP.  - Took a UA today to ensure that his microscopic hematuria has resolved. If he does have any microscopic  blood, would favor cystoscopy given remote smoking history as well as history of radiation. Will call him with the results.  Return in about 6 months (around 12/29/2022) for PSA.  [facility] [address] [place], [place] [address] [phone]

## 2022-06-29 ENCOUNTER — Ambulatory Visit: Payer: Medicare HMO | Admitting: Urology

## 2022-08-01 DIAGNOSIS — C44229 Squamous cell carcinoma of skin of left ear and external auricular canal: Secondary | ICD-10-CM | POA: Diagnosis not present

## 2022-08-01 DIAGNOSIS — D485 Neoplasm of uncertain behavior of skin: Secondary | ICD-10-CM | POA: Diagnosis not present

## 2022-08-01 DIAGNOSIS — L570 Actinic keratosis: Secondary | ICD-10-CM | POA: Diagnosis not present

## 2022-08-05 DIAGNOSIS — D485 Neoplasm of uncertain behavior of skin: Secondary | ICD-10-CM | POA: Diagnosis not present

## 2022-09-15 DIAGNOSIS — C44229 Squamous cell carcinoma of skin of left ear and external auricular canal: Secondary | ICD-10-CM | POA: Diagnosis not present

## 2022-09-22 ENCOUNTER — Encounter: Payer: Self-pay | Admitting: Oncology

## 2022-10-04 ENCOUNTER — Telehealth: Payer: Self-pay | Admitting: *Deleted

## 2022-10-04 NOTE — Telephone Encounter (Signed)
Called his number and it rang and then says - voice mail is full

## 2022-10-05 ENCOUNTER — Encounter: Payer: Self-pay | Admitting: *Deleted

## 2022-10-21 DIAGNOSIS — R221 Localized swelling, mass and lump, neck: Secondary | ICD-10-CM | POA: Diagnosis not present

## 2022-11-09 DIAGNOSIS — R220 Localized swelling, mass and lump, head: Secondary | ICD-10-CM | POA: Diagnosis not present

## 2022-11-09 DIAGNOSIS — C44229 Squamous cell carcinoma of skin of left ear and external auricular canal: Secondary | ICD-10-CM | POA: Diagnosis not present

## 2022-11-09 DIAGNOSIS — R221 Localized swelling, mass and lump, neck: Secondary | ICD-10-CM | POA: Diagnosis not present

## 2022-11-14 ENCOUNTER — Encounter: Payer: Self-pay | Admitting: Oncology

## 2022-11-16 ENCOUNTER — Encounter: Payer: Self-pay | Admitting: Oncology

## 2022-11-17 ENCOUNTER — Inpatient Hospital Stay
Admission: RE | Admit: 2022-11-17 | Discharge: 2022-11-17 | Disposition: A | Discharge status: Home / Self Care | Payer: Self-pay | Source: Ambulatory Visit | Attending: Oncology | Admitting: Oncology

## 2022-11-17 ENCOUNTER — Other Ambulatory Visit: Payer: Self-pay | Admitting: Oncology

## 2022-11-17 DIAGNOSIS — H40013 Open angle with borderline findings, low risk, bilateral: Secondary | ICD-10-CM | POA: Diagnosis not present

## 2022-11-17 DIAGNOSIS — Z8546 Personal history of malignant neoplasm of prostate: Secondary | ICD-10-CM

## 2022-11-17 DIAGNOSIS — H35342 Macular cyst, hole, or pseudohole, left eye: Secondary | ICD-10-CM | POA: Diagnosis not present

## 2022-11-17 DIAGNOSIS — H26493 Other secondary cataract, bilateral: Secondary | ICD-10-CM | POA: Diagnosis not present

## 2022-11-17 DIAGNOSIS — E119 Type 2 diabetes mellitus without complications: Secondary | ICD-10-CM | POA: Diagnosis not present

## 2022-11-18 ENCOUNTER — Telehealth: Payer: Self-pay | Admitting: Oncology

## 2022-11-18 NOTE — Telephone Encounter (Signed)
Christian Kelly called and said he sent a CD disk to Dr Smith Robert and asked if she has looked at it and what the results are. He would like someone to call him.

## 2022-11-30 ENCOUNTER — Telehealth: Payer: Self-pay | Admitting: *Deleted

## 2022-11-30 ENCOUNTER — Other Ambulatory Visit: Payer: Self-pay | Admitting: *Deleted

## 2022-11-30 NOTE — Telephone Encounter (Signed)
Dr. Smith Robert wanted me call the patient and see if he has the papers with the results of the scans that he had.  Patient says that he just got the paper parts of the scan and he will bring it over tomorrow.  I have asked the patient to make a copy of them so that we have a copy and he has a copy.  I also went downstairs to [place] and told her that the patient would be coming here wit info from the radiology scans results and to make a copy and let the patient keep his copy with him.  Both the patient and Martie Lee are aware to make copies.  The copies need to go to Dr. Smith Robert

## 2022-12-08 ENCOUNTER — Other Ambulatory Visit: Payer: Self-pay | Admitting: *Deleted

## 2022-12-08 DIAGNOSIS — Z8546 Personal history of malignant neoplasm of prostate: Secondary | ICD-10-CM

## 2022-12-21 ENCOUNTER — Other Ambulatory Visit: Payer: Medicare HMO

## 2022-12-21 DIAGNOSIS — Z8546 Personal history of malignant neoplasm of prostate: Secondary | ICD-10-CM

## 2022-12-22 DIAGNOSIS — N182 Chronic kidney disease, stage 2 (mild): Secondary | ICD-10-CM | POA: Diagnosis not present

## 2022-12-22 DIAGNOSIS — E1122 Type 2 diabetes mellitus with diabetic chronic kidney disease: Secondary | ICD-10-CM | POA: Diagnosis not present

## 2022-12-22 DIAGNOSIS — Z860101 Personal history of adenomatous and serrated colon polyps: Secondary | ICD-10-CM | POA: Diagnosis not present

## 2022-12-22 DIAGNOSIS — E1169 Type 2 diabetes mellitus with other specified complication: Secondary | ICD-10-CM | POA: Diagnosis not present

## 2022-12-22 DIAGNOSIS — E1159 Type 2 diabetes mellitus with other circulatory complications: Secondary | ICD-10-CM | POA: Diagnosis not present

## 2022-12-22 DIAGNOSIS — E538 Deficiency of other specified B group vitamins: Secondary | ICD-10-CM | POA: Diagnosis not present

## 2022-12-22 DIAGNOSIS — Z85038 Personal history of other malignant neoplasm of large intestine: Secondary | ICD-10-CM | POA: Diagnosis not present

## 2022-12-22 DIAGNOSIS — Z23 Encounter for immunization: Secondary | ICD-10-CM | POA: Diagnosis not present

## 2022-12-22 DIAGNOSIS — I700 Atherosclerosis of aorta: Secondary | ICD-10-CM | POA: Diagnosis not present

## 2022-12-22 DIAGNOSIS — I152 Hypertension secondary to endocrine disorders: Secondary | ICD-10-CM | POA: Diagnosis not present

## 2022-12-22 DIAGNOSIS — Z79899 Other long term (current) drug therapy: Secondary | ICD-10-CM | POA: Diagnosis not present

## 2022-12-22 DIAGNOSIS — Z0000 Encounter for general adult medical examination without abnormal findings: Secondary | ICD-10-CM | POA: Diagnosis not present

## 2022-12-22 LAB — PSA: Prostate Specific Ag, Serum: 0.2 ng/mL (ref 0.0–4.0)

## 2022-12-27 ENCOUNTER — Encounter: Payer: Self-pay | Admitting: Urology

## 2022-12-27 ENCOUNTER — Ambulatory Visit: Payer: Medicare HMO | Admitting: Urology

## 2022-12-27 VITALS — BP 145/75 | HR 59 | Ht 72.0 in | Wt 191.6 lb

## 2022-12-27 DIAGNOSIS — Z8546 Personal history of malignant neoplasm of prostate: Secondary | ICD-10-CM | POA: Diagnosis not present

## 2022-12-27 DIAGNOSIS — R972 Elevated prostate specific antigen [PSA]: Secondary | ICD-10-CM

## 2022-12-27 NOTE — Progress Notes (Signed)
I,Amy L Pierron,acting as a scribe for Vanna Scotland, MD.,have documented all relevant documentation on the behalf of Vanna Scotland, MD,as directed by  Vanna Scotland, MD while in the presence of Vanna Scotland, MD.  12/27/2022 5:54 PM   Christian Kelly 15-Nov-1940 [id number]   Chief Complaint  Patient presents with   Elevated PSA    HPI: 82 year-old male with a personal history of prostate cancer presents today for six month follow-up.  He is status post radical prostatectomy in 1999. He had biochemical recurrence with salvage radiation in 2018, complicated by radiation cystitis. In the more recent past his PSA has risen slightly, not yet reaching the definition of biochemical recurrence.   His most recent PSA from 12/21/2022 remains unchanged and stable at 0.2.  He is doing well overall today with no new symptoms or complaints.   PSA Trend 10/18/2019    undetectable 05/14/2020    0.01 01/04/2021  0.03 06/16/2021    0.06 12/21/2021  0.15 03/24/2022      0.2 06/21/2022    0.2 12/21/2022  0.2   PMH: Past Medical History:  Diagnosis Date   Arthritis    Basal cell carcinoma 2008   forehead   Chronic renal insufficiency, stage III (moderate) (HCC)    Colon cancer (HCC) 1998   Diabetes mellitus without complication (HCC)    Controlled with diet only as of 11/01/2016   Diffuse large B cell lymphoma (HCC)    H/O onychomycosis    History of kidney stones    Hyperlipemia    Hypertension    Lyme disease    Prostate cancer (HCC) 1999    Surgical History: Past Surgical History:  Procedure Laterality Date   COLON SURGERY  1998   COLONOSCOPY     COLONOSCOPY WITH PROPOFOL N/A 12/29/2014   Procedure: COLONOSCOPY WITH PROPOFOL;  Surgeon: Christena Deem, MD;  Location: [facility] ENDOSCOPY;  Service: Endoscopy;  Laterality: N/A;   COLONOSCOPY WITH PROPOFOL N/A 09/04/2017   Procedure: COLONOSCOPY WITH PROPOFOL;  Surgeon: Christena Deem, MD;  Location: [facility] ENDOSCOPY;  Service:  Endoscopy;  Laterality: N/A;   COLONOSCOPY WITH PROPOFOL N/A 01/28/2020   Procedure: COLONOSCOPY WITH PROPOFOL;  Surgeon: Regis Bill, MD;  Location: [facility] ENDOSCOPY;  Service: Endoscopy;  Laterality: N/A;   COLONOSCOPY WITH PROPOFOL N/A 06/07/2021   Procedure: COLONOSCOPY WITH PROPOFOL;  Surgeon: Regis Bill, MD;  Location: [facility] ENDOSCOPY;  Service: Endoscopy;  Laterality: N/A;  DM   CYSTOSCOPY W/ RETROGRADES Bilateral 03/14/2019   Procedure: CYSTOSCOPY WITH RETROGRADE PYELOGRAM;  Surgeon: Vanna Scotland, MD;  Location: [facility] ORS;  Service: Urology;  Laterality: Bilateral;   CYSTOSCOPY WITH BIOPSY N/A 03/14/2019   Procedure: CYSTOSCOPY WITH bladder BIOPSY;  Surgeon: Vanna Scotland, MD;  Location: [facility] ORS;  Service: Urology;  Laterality: N/A;   EYE SURGERY Left    MACULAR HOLE   EYE SURGERY Right    TORN RETINA   FRACTURE SURGERY Left    ARM   IR FLUORO GUIDE PORT INSERTION RIGHT  09/01/2016   IR REMOVAL TUN ACCESS W/ PORT W/O FL MOD SED  03/28/2018   LITHOTRIPSY     PROSTATECTOMY  1998   TONSILLECTOMY      Home Medications:  Allergies as of 12/27/2022       Reactions   Tape Rash        Medication List        Accurate as of December 27, 2022  5:54 PM. If you have any questions, ask  your nurse or doctor.          Accu-Chek FastClix Lancets Misc   aspirin 81 MG chewable tablet   atorvastatin 80 MG tablet Commonly known as: LIPITOR Take 80 mg by mouth daily.   cyanocobalamin 1000 MCG tablet Commonly known as: VITAMIN B12 Take 1,000 mcg by mouth daily.   hydrochlorothiazide 25 MG tablet Commonly known as: HYDRODIURIL Take 25 mg by mouth daily.   lisinopril 10 MG tablet Commonly known as: ZESTRIL Take 10 mg by mouth daily.   Lubricant Eye Drops 0.4-0.3 % Soln Generic drug: Polyethyl Glycol-Propyl Glycol Place 1 drop into both eyes 3 (three) times daily as needed (dry/irritated eyes.).   metFORMIN 750 MG 24 hr tablet Commonly known as:  GLUCOPHAGE-XR Take 750 mg by mouth 2 (two) times daily.   multivitamin with minerals Tabs tablet Take 1 tablet by mouth daily.   True Metrix Air Glucose Meter w/Device Kit   True Metrix Blood Glucose Test test strip Generic drug: glucose blood        Allergies:  Allergies  Allergen Reactions   Tape Rash    Family History: Family History  Problem Relation Age of Onset   Coronary artery disease Mother    Diabetes Mother    Prostate cancer Father    Pancreatitis Father    Bladder Cancer Sister    Diabetes Brother    Diabetes Brother    Prostate cancer Brother    Diabetes Brother    Diabetes Maternal Grandmother     Social History:  reports that he quit smoking about 48 years ago. His smoking use included cigarettes. He started smoking about 56 years ago. He has a 8 pack-year smoking history. He has quit using smokeless tobacco.  His smokeless tobacco use included snuff. He reports current alcohol use. He reports that he does not use drugs.   Physical Exam: BP (!) 145/75   Pulse (!) 59   Ht 6' (1.829 m)   Wt 191 lb 9.6 oz (86.9 kg)   BMI 25.99 kg/m   Constitutional:  Alert and oriented, No acute distress. HEENT: [place] AT, moist mucus membranes.  Trachea midline, no masses. Neurologic: Grossly intact, no focal deficits, moving all 4 extremities. Psychiatric: Normal mood and affect.   Assessment & Plan:    1. History of prostate cancer with biochemical recurrence  - PSA remains stable.  - He has had radiation.   - Remains asymptomatic.  Based on unchanged value, favor continued conservative approach. Will have him recheck in six months, lab only. If it rises, will have him come in earlier, otherwise plan to return in one year with another PSA.  Return in about 1 year (around 12/27/2023) for PSA and UA.   Lab only for PSA in 6 mo  I have reviewed the above documentation for accuracy and completeness, and I agree with the above.   Vanna Scotland, MD   [facility] [address] [place], [place] [address] [phone]

## 2023-01-02 DIAGNOSIS — L578 Other skin changes due to chronic exposure to nonionizing radiation: Secondary | ICD-10-CM | POA: Diagnosis not present

## 2023-01-02 DIAGNOSIS — Z86018 Personal history of other benign neoplasm: Secondary | ICD-10-CM | POA: Diagnosis not present

## 2023-01-02 DIAGNOSIS — Z859 Personal history of malignant neoplasm, unspecified: Secondary | ICD-10-CM | POA: Diagnosis not present

## 2023-01-02 DIAGNOSIS — Z85828 Personal history of other malignant neoplasm of skin: Secondary | ICD-10-CM | POA: Diagnosis not present

## 2023-01-02 DIAGNOSIS — L570 Actinic keratosis: Secondary | ICD-10-CM | POA: Diagnosis not present

## 2023-01-02 DIAGNOSIS — Z872 Personal history of diseases of the skin and subcutaneous tissue: Secondary | ICD-10-CM | POA: Diagnosis not present

## 2023-05-23 DIAGNOSIS — H40012 Open angle with borderline findings, low risk, left eye: Secondary | ICD-10-CM | POA: Diagnosis not present

## 2023-05-23 DIAGNOSIS — E119 Type 2 diabetes mellitus without complications: Secondary | ICD-10-CM | POA: Diagnosis not present

## 2023-05-23 DIAGNOSIS — H35342 Macular cyst, hole, or pseudohole, left eye: Secondary | ICD-10-CM | POA: Diagnosis not present

## 2023-05-23 DIAGNOSIS — H26493 Other secondary cataract, bilateral: Secondary | ICD-10-CM | POA: Diagnosis not present

## 2023-06-16 DIAGNOSIS — I152 Hypertension secondary to endocrine disorders: Secondary | ICD-10-CM | POA: Diagnosis not present

## 2023-06-16 DIAGNOSIS — E785 Hyperlipidemia, unspecified: Secondary | ICD-10-CM | POA: Diagnosis not present

## 2023-06-16 DIAGNOSIS — Z09 Encounter for follow-up examination after completed treatment for conditions other than malignant neoplasm: Secondary | ICD-10-CM | POA: Diagnosis not present

## 2023-06-16 DIAGNOSIS — E1122 Type 2 diabetes mellitus with diabetic chronic kidney disease: Secondary | ICD-10-CM | POA: Diagnosis not present

## 2023-06-16 DIAGNOSIS — E538 Deficiency of other specified B group vitamins: Secondary | ICD-10-CM | POA: Diagnosis not present

## 2023-06-16 DIAGNOSIS — E1169 Type 2 diabetes mellitus with other specified complication: Secondary | ICD-10-CM | POA: Diagnosis not present

## 2023-06-16 DIAGNOSIS — R7989 Other specified abnormal findings of blood chemistry: Secondary | ICD-10-CM | POA: Diagnosis not present

## 2023-06-16 DIAGNOSIS — E1159 Type 2 diabetes mellitus with other circulatory complications: Secondary | ICD-10-CM | POA: Diagnosis not present

## 2023-06-16 DIAGNOSIS — N182 Chronic kidney disease, stage 2 (mild): Secondary | ICD-10-CM | POA: Diagnosis not present

## 2023-06-26 ENCOUNTER — Other Ambulatory Visit: Payer: Self-pay

## 2023-06-26 DIAGNOSIS — Z8546 Personal history of malignant neoplasm of prostate: Secondary | ICD-10-CM | POA: Diagnosis not present

## 2023-06-27 ENCOUNTER — Encounter: Payer: Self-pay | Admitting: Urology

## 2023-06-27 LAB — PSA: Prostate Specific Ag, Serum: 0.3 ng/mL (ref 0.0–4.0)

## 2023-06-30 ENCOUNTER — Telehealth: Payer: Self-pay

## 2023-06-30 NOTE — Telephone Encounter (Addendum)
 Patient sent a mychart message to Dr Ace Holder on 06/27/23 asking for feedback on results and we are waiting on provider to review. Patient advised of this information

## 2023-06-30 NOTE — Telephone Encounter (Signed)
 Pt called the triage line stating he would like to know his PSA results.

## 2023-09-28 ENCOUNTER — Encounter: Payer: Self-pay | Admitting: Urology

## 2023-12-07 DIAGNOSIS — N182 Chronic kidney disease, stage 2 (mild): Secondary | ICD-10-CM | POA: Diagnosis not present

## 2023-12-07 DIAGNOSIS — Z23 Encounter for immunization: Secondary | ICD-10-CM | POA: Diagnosis not present

## 2023-12-07 DIAGNOSIS — E1122 Type 2 diabetes mellitus with diabetic chronic kidney disease: Secondary | ICD-10-CM | POA: Diagnosis not present

## 2023-12-07 DIAGNOSIS — I152 Hypertension secondary to endocrine disorders: Secondary | ICD-10-CM | POA: Diagnosis not present

## 2023-12-07 DIAGNOSIS — N3040 Irradiation cystitis without hematuria: Secondary | ICD-10-CM | POA: Diagnosis not present

## 2023-12-07 DIAGNOSIS — E785 Hyperlipidemia, unspecified: Secondary | ICD-10-CM | POA: Diagnosis not present

## 2023-12-07 DIAGNOSIS — R7989 Other specified abnormal findings of blood chemistry: Secondary | ICD-10-CM | POA: Diagnosis not present

## 2023-12-07 DIAGNOSIS — E538 Deficiency of other specified B group vitamins: Secondary | ICD-10-CM | POA: Diagnosis not present

## 2023-12-07 DIAGNOSIS — N2889 Other specified disorders of kidney and ureter: Secondary | ICD-10-CM | POA: Diagnosis not present

## 2023-12-07 DIAGNOSIS — I700 Atherosclerosis of aorta: Secondary | ICD-10-CM | POA: Diagnosis not present

## 2023-12-07 DIAGNOSIS — E1169 Type 2 diabetes mellitus with other specified complication: Secondary | ICD-10-CM | POA: Diagnosis not present

## 2023-12-07 DIAGNOSIS — Z1329 Encounter for screening for other suspected endocrine disorder: Secondary | ICD-10-CM | POA: Diagnosis not present

## 2023-12-07 DIAGNOSIS — E1159 Type 2 diabetes mellitus with other circulatory complications: Secondary | ICD-10-CM | POA: Diagnosis not present

## 2023-12-07 DIAGNOSIS — Z0000 Encounter for general adult medical examination without abnormal findings: Secondary | ICD-10-CM | POA: Diagnosis not present

## 2023-12-26 ENCOUNTER — Other Ambulatory Visit: Payer: Self-pay

## 2023-12-26 DIAGNOSIS — Z8546 Personal history of malignant neoplasm of prostate: Secondary | ICD-10-CM

## 2023-12-27 ENCOUNTER — Other Ambulatory Visit: Payer: Self-pay

## 2023-12-27 DIAGNOSIS — Z8546 Personal history of malignant neoplasm of prostate: Secondary | ICD-10-CM

## 2023-12-28 LAB — PSA: Prostate Specific Ag, Serum: 0.3 ng/mL (ref 0.0–4.0)

## 2024-01-02 DIAGNOSIS — Z872 Personal history of diseases of the skin and subcutaneous tissue: Secondary | ICD-10-CM | POA: Diagnosis not present

## 2024-01-02 DIAGNOSIS — L578 Other skin changes due to chronic exposure to nonionizing radiation: Secondary | ICD-10-CM | POA: Diagnosis not present

## 2024-01-02 DIAGNOSIS — Z859 Personal history of malignant neoplasm, unspecified: Secondary | ICD-10-CM | POA: Diagnosis not present

## 2024-01-02 DIAGNOSIS — L570 Actinic keratosis: Secondary | ICD-10-CM | POA: Diagnosis not present

## 2024-01-02 DIAGNOSIS — Z85828 Personal history of other malignant neoplasm of skin: Secondary | ICD-10-CM | POA: Diagnosis not present

## 2024-01-02 DIAGNOSIS — Z86018 Personal history of other benign neoplasm: Secondary | ICD-10-CM | POA: Diagnosis not present

## 2024-01-03 ENCOUNTER — Ambulatory Visit: Payer: Self-pay | Admitting: Urology

## 2024-01-03 ENCOUNTER — Ambulatory Visit: Admitting: Urology

## 2024-01-03 VITALS — BP 161/74 | HR 85 | Ht 72.0 in | Wt 195.0 lb

## 2024-01-03 DIAGNOSIS — Z8546 Personal history of malignant neoplasm of prostate: Secondary | ICD-10-CM

## 2024-01-03 DIAGNOSIS — Z08 Encounter for follow-up examination after completed treatment for malignant neoplasm: Secondary | ICD-10-CM | POA: Diagnosis not present

## 2024-01-03 NOTE — Progress Notes (Signed)
   01/03/2024 9:30 AM   Christian Kelly 1940-10-17 [id number]  Reason for visit: Prostate cancer, radiation cystitis  History: Previously followed by Dr. Penne, my initial visit with him November 2025 History of radical prostatectomy 1999, biochemical recurrence with salvage radiation 2018 complicated by radiation cystitis  Physical Exam: BP (!) 161/74 (BP Location: Left Arm, Patient Position: Sitting, Cuff Size: Normal)   Pulse 85   Ht 6' (1.829 m)   Wt 195 lb (88.5 kg)   SpO2 99%   BMI 26.45 kg/m   Imaging/labs: PSA November 2025 0.3, stable from 0.24 Jun 2023, and 0.2 in 2024  Today: No urologic complaints, overall doing very well  Plan:   Prostate cancer: Treated with radical prostatectomy 1999, biochemical recurrence 2018 treated with salvage radiation.  PSA remains very low, and can continue monitoring with more of a watchful waiting approach with his age.  Risks and benefits were reviewed. Radiation cystitis: He denies any significant urinary symptoms, no recent gross hematuria RTC 6 months lab visit PSA, if stable will RTC in person 1 year with PSA prior   Christian JAYSON Burnet, MD  [facility] [address] [place], [place] [address] [phone]

## 2024-01-04 ENCOUNTER — Ambulatory Visit: Admitting: Urology

## 2024-07-02 ENCOUNTER — Other Ambulatory Visit
